# Patient Record
Sex: Male | Born: 1940 | Race: White | Hispanic: No | Smoking: Never smoker
Health system: Southern US, Community
[De-identification: ages and names within clinical notes are randomized; demographics above are authoritative.]

## PROBLEM LIST (undated history)

## (undated) DIAGNOSIS — M109 Gout, unspecified: Secondary | ICD-10-CM

## (undated) DIAGNOSIS — M25561 Pain in right knee: Secondary | ICD-10-CM

## (undated) DIAGNOSIS — I10 Essential (primary) hypertension: Secondary | ICD-10-CM

## (undated) DIAGNOSIS — K759 Inflammatory liver disease, unspecified: Secondary | ICD-10-CM

## (undated) DIAGNOSIS — E538 Deficiency of other specified B group vitamins: Secondary | ICD-10-CM

## (undated) DIAGNOSIS — I639 Cerebral infarction, unspecified: Secondary | ICD-10-CM

## (undated) DIAGNOSIS — K859 Acute pancreatitis without necrosis or infection, unspecified: Secondary | ICD-10-CM

## (undated) HISTORY — PX: CHOLECYSTECTOMY: SHX55

---

## 2003-07-01 HISTORY — PX: CORONARY ARTERY BYPASS GRAFT: SHX141

## 2008-10-26 ENCOUNTER — Ambulatory Visit: Payer: Self-pay | Admitting: Cardiology

## 2008-10-27 ENCOUNTER — Encounter (INDEPENDENT_AMBULATORY_CARE_PROVIDER_SITE_OTHER): Payer: Self-pay | Admitting: Internal Medicine

## 2008-10-27 ENCOUNTER — Ambulatory Visit: Payer: Self-pay | Admitting: Internal Medicine

## 2008-10-27 ENCOUNTER — Inpatient Hospital Stay (HOSPITAL_COMMUNITY): Admission: EM | Admit: 2008-10-27 | Discharge: 2008-11-02 | Payer: Self-pay | Admitting: Emergency Medicine

## 2008-10-31 ENCOUNTER — Ambulatory Visit: Payer: Self-pay | Admitting: Physical Medicine & Rehabilitation

## 2008-11-01 ENCOUNTER — Encounter (INDEPENDENT_AMBULATORY_CARE_PROVIDER_SITE_OTHER): Payer: Self-pay | Admitting: Internal Medicine

## 2008-11-01 ENCOUNTER — Ambulatory Visit: Payer: Self-pay | Admitting: Vascular Surgery

## 2008-11-02 ENCOUNTER — Inpatient Hospital Stay (HOSPITAL_COMMUNITY)
Admission: RE | Admit: 2008-11-02 | Discharge: 2008-11-14 | Payer: Self-pay | Admitting: Physical Medicine & Rehabilitation

## 2008-11-02 ENCOUNTER — Ambulatory Visit: Payer: Self-pay | Admitting: Physical Medicine & Rehabilitation

## 2008-11-10 ENCOUNTER — Ambulatory Visit: Payer: Self-pay | Admitting: Psychology

## 2008-11-29 ENCOUNTER — Encounter (INDEPENDENT_AMBULATORY_CARE_PROVIDER_SITE_OTHER): Payer: Self-pay | Admitting: *Deleted

## 2010-10-08 LAB — CBC
HCT: 44.9 % (ref 39.0–52.0)
HCT: 48 % (ref 39.0–52.0)
Hemoglobin: 16.3 g/dL (ref 13.0–17.0)
MCHC: 34.7 g/dL (ref 30.0–36.0)
MCHC: 35.3 g/dL (ref 30.0–36.0)
MCV: 92.4 fL (ref 78.0–100.0)
MCV: 92.8 fL (ref 78.0–100.0)
MCV: 93.8 fL (ref 78.0–100.0)
Platelets: 195 10*3/uL (ref 150–400)
Platelets: 202 10*3/uL (ref 150–400)
RBC: 5.05 MIL/uL (ref 4.22–5.81)
RBC: 5.07 MIL/uL (ref 4.22–5.81)
RBC: 5.07 MIL/uL (ref 4.22–5.81)
RDW: 13.1 % (ref 11.5–15.5)
RDW: 13.2 % (ref 11.5–15.5)
RDW: 13.3 % (ref 11.5–15.5)
RDW: 13.6 % (ref 11.5–15.5)
WBC: 6.9 10*3/uL (ref 4.0–10.5)
WBC: 6.9 10*3/uL (ref 4.0–10.5)
WBC: 8.2 10*3/uL (ref 4.0–10.5)

## 2010-10-08 LAB — COMPREHENSIVE METABOLIC PANEL
ALT: 13 U/L (ref 0–53)
ALT: 22 U/L (ref 0–53)
AST: 15 U/L (ref 0–37)
AST: 21 U/L (ref 0–37)
Albumin: 3.1 g/dL — ABNORMAL LOW (ref 3.5–5.2)
Alkaline Phosphatase: 45 U/L (ref 39–117)
Alkaline Phosphatase: 53 U/L (ref 39–117)
CO2: 30 mEq/L (ref 19–32)
Chloride: 102 mEq/L (ref 96–112)
Chloride: 109 mEq/L (ref 96–112)
GFR calc Af Amer: >60 mL/min (ref >60)
GFR calc Af Amer: >60 mL/min (ref >60)
GFR calc non Af Amer: >60 mL/min (ref >60)
Potassium: 4.3 mEq/L (ref 3.5–5.1)
Sodium: 140 mEq/L (ref 135–145)
Sodium: 142 mEq/L (ref 135–145)
Total Bilirubin: 0.8 mg/dL (ref 0.3–1.2)
Total Bilirubin: 1 mg/dL (ref 0.3–1.2)
Total Protein: 5.8 g/dL — ABNORMAL LOW (ref 6.0–8.3)

## 2010-10-08 LAB — BASIC METABOLIC PANEL
BUN: 11 mg/dL (ref 6–23)
BUN: 9 mg/dL (ref 6–23)
CO2: 27 mEq/L (ref 19–32)
CO2: 32 mEq/L (ref 19–32)
Calcium: 8.7 mg/dL (ref 8.4–10.5)
Calcium: 9 mg/dL (ref 8.4–10.5)
Chloride: 102 mEq/L (ref 96–112)
Chloride: 109 mEq/L (ref 96–112)
Creatinine, Ser: 0.8 mg/dL (ref 0.4–1.5)
Creatinine, Ser: 0.82 mg/dL (ref 0.4–1.5)
Creatinine, Ser: 1.04 mg/dL (ref 0.4–1.5)
GFR calc Af Amer: >60 mL/min (ref >60)
GFR calc Af Amer: >60 mL/min (ref >60)
GFR calc non Af Amer: >60 mL/min (ref >60)
Glucose, Bld: 88 mg/dL (ref 70–99)
Glucose, Bld: 96 mg/dL (ref 70–99)
Potassium: 3.7 mEq/L (ref 3.5–5.1)

## 2010-10-08 LAB — URIC ACID: Uric Acid, Serum: 6.3 mg/dL (ref 4.0–7.8)

## 2010-10-08 LAB — CARDIAC PANEL(CRET KIN+CKTOT+MB+TROPI)
CK, MB: 1.3 ng/mL (ref 0.3–4.0)
Total CK: 48 U/L (ref 7–232)
Troponin I: 0.02 ng/mL (ref 0.00–0.06)

## 2010-10-08 LAB — DIFFERENTIAL
Basophils Absolute: 0 10*3/uL (ref 0.0–0.1)
Basophils Relative: 1 % (ref 0–1)
Eosinophils Absolute: 0.1 10*3/uL (ref 0.0–0.7)
Eosinophils Relative: 1 % (ref 0–5)

## 2010-10-09 LAB — URINALYSIS, ROUTINE W REFLEX MICROSCOPIC
Ketones, ur: 15 mg/dL — AB
Nitrite: NEGATIVE
Protein, ur: NEGATIVE mg/dL

## 2010-10-09 LAB — COMPREHENSIVE METABOLIC PANEL
AST: 21 U/L (ref 0–37)
Alkaline Phosphatase: 53 U/L (ref 39–117)
BUN: 10 mg/dL (ref 6–23)
CO2: 28 mEq/L (ref 19–32)
Chloride: 105 mEq/L (ref 96–112)
Creatinine, Ser: 1.03 mg/dL (ref 0.4–1.5)
GFR calc Af Amer: >60 mL/min (ref >60)
GFR calc non Af Amer: >60 mL/min (ref >60)
Total Bilirubin: 0.7 mg/dL (ref 0.3–1.2)

## 2010-10-09 LAB — CARDIAC PANEL(CRET KIN+CKTOT+MB+TROPI)
CK, MB: 1.5 ng/mL (ref 0.3–4.0)
Relative Index: INVALID (ref 0.0–2.5)
Relative Index: INVALID (ref 0.0–2.5)
Total CK: 55 U/L (ref 7–232)
Total CK: 60 U/L (ref 7–232)
Troponin I: 0.02 ng/mL (ref 0.00–0.06)

## 2010-10-09 LAB — BRAIN NATRIURETIC PEPTIDE: Pro B Natriuretic peptide (BNP): 119 pg/mL — ABNORMAL HIGH (ref 0.0–100.0)

## 2010-10-09 LAB — RAPID URINE DRUG SCREEN, HOSP PERFORMED
Amphetamines: NOT DETECTED
Benzodiazepines: NOT DETECTED
Cocaine: NOT DETECTED
Opiates: NOT DETECTED
Tetrahydrocannabinol: NOT DETECTED

## 2010-10-09 LAB — DIFFERENTIAL
Basophils Absolute: 0 10*3/uL (ref 0.0–0.1)
Eosinophils Relative: 0 % (ref 0–5)
Lymphocytes Relative: 15 % (ref 12–46)
Neutro Abs: 5.2 10*3/uL (ref 1.7–7.7)
Neutrophils Relative %: 77 % (ref 43–77)

## 2010-10-09 LAB — HEPATIC FUNCTION PANEL
ALT: 17 U/L (ref 0–53)
Indirect Bilirubin: 0.6 mg/dL (ref 0.3–0.9)
Total Protein: 6.4 g/dL (ref 6.0–8.3)

## 2010-10-09 LAB — CBC
HCT: 52.1 % — ABNORMAL HIGH (ref 39.0–52.0)
Platelets: 206 10*3/uL (ref 150–400)
RDW: 13.4 % (ref 11.5–15.5)

## 2010-10-09 LAB — LIPID PANEL
Cholesterol: 207 mg/dL — ABNORMAL HIGH (ref 0–200)
HDL: 29 mg/dL — ABNORMAL LOW (ref >39)

## 2010-10-09 LAB — MAGNESIUM: Magnesium: 2.1 mg/dL (ref 1.5–2.5)

## 2010-10-09 LAB — TSH: TSH: 1.014 u[IU]/mL (ref 0.350–4.500)

## 2010-10-09 LAB — POCT I-STAT, CHEM 8
BUN: 14 mg/dL (ref 6–23)
Calcium, Ion: 0.99 mmol/L — ABNORMAL LOW (ref 1.12–1.32)
Chloride: 109 mEq/L (ref 96–112)

## 2010-10-09 LAB — APTT: aPTT: 26 seconds (ref 24–37)

## 2010-10-09 LAB — POCT CARDIAC MARKERS
Myoglobin, poc: 58.3 ng/mL (ref 12–200)
Troponin i, poc: <0.05 ng/mL (ref 0.00–0.09)

## 2010-11-12 NOTE — Consult Note (Signed)
NAME:  Brandon Howard NO.:  [id number]   MEDICAL RECORD NO.:  [id number]          PATIENT TYPE:  INP   LOCATION:  5524                         FACILITY:  [facility]   PHYSICIAN:  Deanna Artis. Hickling, M.D.DATE OF BIRTH:  21-May-1941   DATE OF CONSULTATION:  10/30/2008  DATE OF DISCHARGE:                                 CONSULTATION   CHIEF COMPLAINT:  Right hemidystaxia, nausea, vomiting, evaluate  subacute stroke.   HISTORY OF PRESENT CONDITION:  [name] is a 70 year old gentleman  who was admitted on October 26, 2008.  He had awakened and feeling  somewhat dizzy, but did not feel the front of his dizziness until he  started to go downstairs to breakfast.  He was unable to stand, felt  nauseated, and had vertigo.  He does not remember which direction.  He  came back upstairs to rest.  Ultimately, he was admitted to [facility].  History that was obtained was that he had been having  vomiting 5-6 times a day for 3 days.  He denied abdominal pain.  He  would not able to keep anything down for 4-5 days.  He was thought to  have gastroenteritis.  The history that he gave then and that he gives  now are very different.   The working diagnosis for this gentleman was dizziness, history of  alcohol abuse, new EKG changes.  He was seen by Cardiology who felt that  the EKG changes were nonspecific.  He was noted have hypertension,  dyslipidemia, as well as history of alcohol abuse.   CT scan of the brain was carried out on Oct 28, 2008 and showed what  appeared to be evidence of an infarction of the right cerebellar  hemisphere.  This was confirmed the next day, Oct 29, 2008 on MRI scan.  We were contacted this morning to evaluate this patient for his stroke.  It was noted that he had significant narrowing in the posterior cerebral  artery and the right superior cerebellar artery.  The question was  whether or not these could be stented and the answer was no.   The patient's nausea and vomiting have subsided.  He still remains  unsteady on his right side for fine and gross motor movements.   I reviewed the MRI scan and it shows evidence of a subacute stroke  involving virtually the entire territory of the right superior  cerebellar artery.  I have looked at the MRA and it shows a cut off 1.5  cm following the origin of the superior cerebellar artery.  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  The remainder of the vessels are unremarkable.  The  patient has diffuse atrophy that was both cortical, subcortical and also  within the white matter vessel tracts could be seen.  There was a  significant amount of movement.   No other remote strokes were seen.   Past medical history is remarkable for as noted above.  PAST SURGICAL HISTORY:  Coronary artery bypass graft about 4 or 5 years  ago with Evelene Croon.  The patient's known risk factors for stroke  include hypertension, dyslipidemia, and coronary artery disease.   The patient was on no medications prior to hospitalization and had not  seen a doctor in years.   He is currently on Norvasc 5 mg daily, aspirin 81 mg daily, subcutaneous  heparin 5000 units every 8 hours for DVT prophylaxis, lisinopril 40 mg  daily, Zocor 20 mg in the evening, oxycodone 4 mg every 4 hours as  needed, Zofran 4 mg every 6 hours as needed.   DRUG ALLERGIES:  None known.   FAMILY HISTORY:  Noncontributory.   SOCIAL HISTORY:  The patient drinks 4-5 beers daily.  He does not use  drugs.  He has not smoked cigarettes.  He lives by himself at [facility]  He has 4 children.  He had been divorced twice.   His 12-system review as recorded above.   PHYSICAL EXAMINATION:  VITAL SIGNS:  Today, blood pressure is 124-157/81-  92, resting pulse is 59, respirations 18, temperature 97.7.  HEAD,  EYES, EARS, NOSE AND THROAT:  No signs of infection.  No cranial  or cervical bruits.  LUNGS:  Clear to auscultation.  HEART:  No murmurs.  Pulses normal.  ABDOMEN:  Soft, nontender.  Bowel sounds normal.  EXTREMITIES:  Well formed without edema, cyanosis, alterations in tone,  or tight heel cords.  SKIN:  No lesions.  VASCULAR:  Tone was normal.  NEUROLOGIC:  Mental Status:  The patient is awake, alert, oriented, able  to carry out commands.  Cranial Nerves:  Round and reactive pupils.  Visual fields full.  Extraocular movements full and conjugate.  Symmetric facial strength and sensation.  He has mild slurred speech.  Sensation is intact in his face.  He has full shoulder shrug.  Motor  examination showed excellent strength in his arms in his legs and no  drift.  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  Deep tendon reflexes are symmetric  and normally upper extremity is diminished to absent lower extremities.  The patient had bilateral flexor plantar responses.  His gait is  unsteady.  He falls to the right side as he walks.  I was able to  support him along with his son as we allowed him to walk.   Review with his laboratories; hemoglobin A1c 5.4, TSH 1.014, vitamin B12  255.  Urine drug screen was negative.  Sodium 142, potassium 4.3,  chloride 109, CO2 29, BUN 12, creatinine 1.23, glucose 94.  White blood  cell count 6900, hemoglobin 15.7, hematocrit 44.9, platelet count  195,000.  Total triglycerides 178, cholesterol 207, HDL low at 29, LDL  elevated at 142.   IMPRESSION:  Right superior cerebellar artery stroke.  I suspect that  this was embolic, but given that he felt dizzy as he awakened, I cannot  rule out a thrombotic process.  There is a definite cut off.  We have no  source for emboli, but the he needs to have a 2-D echocardiogram,  carotid Doppler, and transcranial Doppler.  He has had the remainder of  his workup  for treatable causes of stroke.  I agree with all this  treatment for stroke other than aspirin needs to be increased from 81 to  325 mg.  He will be followed by my partner Dr. Pearlean Brownie from the Stroke  Service.  I do not think this patient is a good candidate for stent even  though there is a small lesion in the mid basilar region.  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 (434.11, 780.4,784.5))   The patient has bilateral limb dystaxia and dysarthria.  I gave him a  score of 3 for NIH stroke scale.  He had a swallowing study only today.  We will work on other risk factors for stroke.  He was not considered to  be a tPA  candidate because of delay in diagnosis, but also since he awakened with  dizziness, he would have presented much too late to receive tPA.  Readdressing his other risk factors.  We will consider rehab, which I  think will be the appropriate for him.      Deanna Artis. Sharene Skeans, M.D.  Electronically Signed     [facility]/MEDQ  D:  10/30/2008  T:  10/31/2008  Job:  [id number]   cc:   Peggye Pitt, M.D.

## 2010-11-12 NOTE — Discharge Summary (Signed)
NAME:  Brandon Howard, Brandon Howard NO.:  [id number]   MEDICAL RECORD NO.:  [id number]          PATIENT TYPE:  IPS   LOCATION:  4036                         FACILITY:  [facility]   PHYSICIAN:  Erick Colace, M.D.DATE OF BIRTH:  [birth year]   DATE OF ADMISSION:  11/02/2008  DATE OF DISCHARGE:  11/14/2008                               DISCHARGE SUMMARY   DISCHARGE DIAGNOSES:  1. Right cerebellar cerebrovascular accident with ataxia.  2. Hypertension.  3. Dyslipidemia.  4. Coronary artery disease.  5. Dizziness resolved.   HISTORY OF PRESENT ILLNESS:  Brandon Howard is a 70 year old male with  history of coronary artery disease admitted October 26, 2008 with  dizziness, nausea, vomiting and numbness in right arm.  Past admission  EKG done showed some ST changes.  CT of head showed edema right  cerebellum question mass.  Cardiology was consulted for input and felt  ST changes were nonspecific in setting of LVH and dizziness was not  cardiac related.  Neuro was consulted for input and MRI, MRA of the  brain ordered showed acute to subacute infarct affecting right superior  cerebellar hemispheres, significant venous angioma, mild swelling with  chronic small vessel disease, stenosis of right PCA at origin, and  marked diminished flow right superior cerebellar artery.  Cardiac echo  done showed moderate LVH with EF of 60%.  Carotid Doppler showed left 40-  60% ICA stenosis.  The patient was placed on Plavix for CVA prophylaxis  with question of RS trial.  Speech therapy evaluation done showed severe  dysarthria with the patient needing max cues to slow speech tone and  articulate.  Continues with dysmetric right upper extremity greater than  right lower extremity as well as impairments in balance and stability.  The patient was evaluated by rehab and felt to be a good candidate for  inpatient rehab program.   PAST MEDICAL HISTORY:  Significant for:  1. CABG in 2005.  2.  Dyslipidemia.  3. Hypertension.  4. Medical noncompliance.  5. History of cholelithiasis, pancreatitis and hepatitis leading to      cholecystectomy.   ALLERGIES:  No known drug allergies.   FAMILY HISTORY:  Positive for CVA and COPD.   SOCIAL HISTORY:  The patient is divorced, lives alone in [place]  place in one-level home.  No steps at entry.  Chews one pack tobacco a  day.  Drinks 4-6 beers a day.  The family in town to check in on patient  past discharge.   FUNCTIONAL HISTORY:  The patient was independent prior to admission.  He  does not drive.   FUNCTIONAL STATUS:  The patient is mod assist for ADLs, min assist for  transfers, min assist ambulating 55 feet with a rolling walker,  difficulty with right lower extremity with tendency for right lower  extremity cross over.  Occasional right lean reported with ambulation.   PHYSICAL EXAMINATION:  VITAL SIGNS:  Blood pressure 99/69, pulse 66,  respiratory rate 20, and temperature 97.0.  GENERAL:  The patient is well-nourished, well-developed male in no acute  distress currently.  HEENT:  Pupils equal, round,  and reactive to light.  Eyes anicteric,  noninjected.  External ENT normal.  Poor dentition noted.  NECK:  Supple without JVD or lymphadenopathy.  LUNGS:  Clear to auscultation without wheezes, rales or rhonchi.  HEART:  Regular rate and rhythm without murmurs.  EXTREMITY:  No edema.  He has good pedal pulses.  ABDOMEN:  Soft, nontender with positive bowel sounds.  NEUROLOGIC:  Cranial nerves showed no evidence of cranial nerve palsy,  deep tendon reflexes normal.  Sensation normal.  Orientation is x3.  Mood and affect appeared intact.  Motor strength is 5/5 bilateral  deltoid, biceps, triceps, finger flexors, hip flexors, quad, TA and  gastroc.  He has moderate-to-severe dysmetria right finger-to-nose and  right heel to shin, sitting balance is fair.   HOSPITAL COURSE:  Brandon Howard was admitted to rehab on  Nov 02, 2008 for inpatient therapies to consist of PT, OT and Speech Therapy at  least 3 hours 5 days a week.  Past admission physiatrist, rehab RN and  therapy team have worked together to provide customized collaborative  interdisciplinary care.  The patient's blood pressures have been  monitored with b.i.d. checks.  These had been reasonably controlled  ranging from 110-120 systolics and 70s to 80s diastolic.  Heart rate has  ranged from 70s to 80s range.  Labs were checked past admission  revealing hemoglobin 16.3, hematocrit 47.5, white count 8.2, platelets  240.  Check of lytes revealed sodium 140, potassium 4.0, chloride 102,  CO2 30, BUN 16, creatinine 1.16.  Glucose of 112.  LFTs revealed AST 21,  ALT 22, total protein 6.8, albumin 3.1.  The patient with complaints of  gout with right ankle pain.  Uric acid level was noted to be at 6.3.  The patient was treated with colchicine 0.6 mg x5 days and started on  allopurinol standing dose which is to continue past discharge.  The  patient reports using excessive amounts of Aleve at home for gout flares  which occur a couple of times a month.  He is advised not to use Aleve  and to continue allopurinol at 100 mg a day with further adjustment in  dose by primary MD as needed to prevent gout flares.  The patient's  vitamin B12 levels were noted to be borderline low at 255.  He was  treated with vitamin B12 injections 1000 mcg x7 days.  It is recommended  the patient continue on q. month supplementation to help with vitamin  B12 deficiency.  The patient's p.o. intake has been good.  He has been  continent of bowel and bladder.  The patient has had issues with right  knee instability and pain.  X-rays of right knee done showed significant  tricompartmental degenerative changes with possible small joint  effusion, no bony findings.  The patient was fitted with knee brace to  help with his symptomatology as well as for support which has  helped  with his mobility.  Neuropsych evaluation was done for alcohol  counseling.  Dr. Leonides Cave evaluated the patient and the patient states he  intended to abstain from alcohol and tobacco in the future.  The patient  is aware that alcohol and tobacco are risk factors for his CVA.  He  denied any current cravings or withdrawal symptoms from alcohol.  The  patient was not interested in any alcohol counseling or services at the  current time.   During the patient's stay in rehab, weekly team conferences were held  to  monitor the patient's progress, set goals as well as discuss barriers to  discharge.  Rehab RN has been assisting the patient with emphasis on  safety awareness.  They have also been toileting patient q.4 h. for  maintenance of continence.  They have been working on pressure relief  measures to prevent any breakdown.  At the time of admission to rehab,  the patient was limited in ADL participation due to ataxia with decrease  in static as well as dynamic standing balance, decrease in right upper  extremity control with proprioception and sensory deficits.  He was  noted to have decrease in endurance as well as decrease in overall  strength.  OT has been working with the patient on balance at edge of  sink for bathing and dressing.  The patient was noted to be at min  assist for ADLs overall.  OT has also been working the patient on right  upper extremity control and utilizing right upper extremity for bathing.  The patient has progressed along in his self-care to being modified  independent for bathing, dressing as well as toileting.  PT evaluation  revealed patient supervision for bed mobility requiring verbal cues for  safety.  He was noted to be impulsive with mobility.  He was min assist  for sit to stand.  He was min assist ambulating 100 feet with a rolling  walker with tactile cues for truncal alignment as well as weight  shifting.  Due to issues with right knee  instability, right knee  orthosis was ordered which has improved his right lateral weight  shifting as well as improved stance in right lower extremity.  PT has  been working with the patient on donning and doffing the brace as well  as static dynamic standing balance activity to include stepping over  objects stooping on floor to recover objects as well as ball toss to  help challenges balance.  The patient is showing improvement in his  safety awareness as well as improvement in weight shifting.  He also  reported a significant decline in right knee pain with the knee brace.  The patient progressed to overall modified independent level for  ambulation, wheelchair mobility as well as transfer using his right knee  orthosis as well as a rolling walker.  He was able to negotiate five  stairs with curb and ramp with a rolling walker at modified independent  level.  The patient will continue with outpatient physical therapy to  address right knee instability as well as high-level balance deficits.  Speech Therapy has been working with the patient on dysarthria.  At the  time of admission, the patient was noted to have moderate ataxic  dysarthria with decreased articulation and coordination as well as  decrease in accuracy of articulation resulting in 70% speech  intelligibility at conversation level.  He was also noted to have  deficits in recall attention and complex reasoning however, question  whether this was premorbid.  Speech Therapy has been working with the  patient on speech strategies including using short sentences in speech  as well as increasing speech intelligibility.  They have also been  working with the patient on memory with techniques for recall as well as  carryover.  The patient has made good progress in speech and cognitive  therapy goals.  He is currently modified independent for mild complex  problem solving and requiring supervision to intermittent cues for  complex  level problem solving.  He  is able to increase speech  intelligibility to 90-95% with intermittent cues to use speech  strategies in conversation.  The patient understands strategies needed  to help with clarity of speech and should improve to 100% over  time.  The patient is set up for outpatient PT, OT, Speech Therapy at [facility] to begin on Nov 15, 2008.  On Nov 14, 2008, the  patient is discharged to home with family to provide supervision and  assist intermittently.   DISCHARGE MEDICATIONS:  1. Plavix 75 mg p.o. per day.  2. Norvasc 10 mg p.o. per day.  3. Zocor 40 mg at bedtime.  4. Vitamin B1 100 mg a day.  5. Folic acid 1 mg a day.  6. Prinivil 20 mg a day.  7. Trazodone 25 mg at bedtime.  8. Senokot-S two p.o. at bedtime.  9. Allopurinol 100 mg p.o. per day.   DIET:  Heart healthy.   ACTIVITY LEVEL:  Min supervision.  No strenuous activity.  No alcohol,  no smoking, no driving.   SPECIAL INSTRUCTIONS:  [facility] to begin Wednesday,  Nov 15, 2008, at 11 a.m.   FOLLOW UP:  The patient is to follow up with Dr. Reyes Ivan on December 19, 2008, for 12:30 appointment.  Follow up with Dr. Concepcion Elk November 28, 2008,  at 9 a.m.      Greg Cutter, P.A.      Erick Colace, M.D.  Electronically Signed    PP/MEDQ  D:  11/15/2008  T:  11/15/2008  Job:  [id number]   cc:   [name], M.D.  Deanna Artis. Sharene Skeans, M.D.

## 2010-11-12 NOTE — H&P (Signed)
NAME:  Brandon Howard, Brandon Howard NO.:  [id number]   MEDICAL RECORD NO.:  [id number]          PATIENT TYPE:  INP   LOCATION:  5524                         FACILITY:  [facility]   PHYSICIAN:  Vania Rea, M.D. DATE OF BIRTH:  [birth year]   DATE OF ADMISSION:  10/26/2008  DATE OF DISCHARGE:                              HISTORY & PHYSICAL   This is a 70 year old with past medical history of CAD status post CABG  comes in for dizziness, better with lying still and lying flat and worse  with sitting up and standing up.  This has been going on for several  days.  He started vomiting with these episodes about 5-6 times a day for  the last 3 days.  He denies any abdominal pain, decreased urine output.  He has not been able to keep anything down for the past 4 or 5 days.  He  denies any chest pain, shortness of breath, fever or chills, upper  respiratory tract infection, decreased hearing, tinnitus, cough, or  viral upper respiratory tract infection.   ALLERGIES:  No known drug allergies.   PAST MEDICAL HISTORY:  1. CABG 4 or 5 years ago.  2. Alcohol abuse.   MEDICATIONS:  No known medications.   SOCIAL HISTORY:  Drinks 4-6 beers every day.  His last drink was 4 days  ago.  Denies recreational drugs, marijuana, cocaine, and IV drugs.  Denies tobacco.   FAMILY HISTORY:  Father died of COPD the age of 36.  His mother died of  throat cancer at the age of 30.   REVIEW OF SYSTEMS:  GENERAL:  Lightheadedness.  He says he is thirsty  and his mouth feels dry.  CARDIOVASCULAR:  No palpitation.  No PND.  No chest pain.  LUNGS:  No shortness of breath, cough, or hemoptysis.  GI:  No abdominal pain.  No diarrhea or constipation.  NEUROLOGIC:  No blurry vision or double vision.   PHYSICAL EXAMINATION:  VITAL SIGNS:  Temperature 97.6, heart rate of 62,  blood pressure 205/98, repeated blood pressure 162/78, respirations 14,  he is sating 95% on room air.  GENERAL:  He is lying in bed  still with his eyes closed.  NECK:  No bruits or JVD.  HEENT:  Dry mucous membrane.  No erythema or plaque.  CARDIOVASCULAR:  Regular rate and rhythm.  Positive S1 and S2.  No  murmurs, rubs, or gallops.  LUNGS:  Good air movement and clear to auscultation.  ABDOMEN:  Positive bowel sounds, nontender, nondistended, and soft.  No  hepatosplenomegaly.  EXTREMITIES:  Normal pulses.  No edema.  No ulcers.  NEUROLOGIC:  He is alert and oriented x3, coherent and fluent language,  III through XII are grossly intact.  The patient has no nystagmus and  normal hearing.  Sensation is intact throughout.  Muscle strength is 5/5  in all extremities, 2+ deep tendon reflex.  Finger-to-nose normal.  Gait  was not tested.   LABORATORY DATA:  UA showed positive for ketones.  Sodium 139, potassium  3.4, chloride 109, bicarb of 25, BUN 14, creatinine 1.2, glucose 136.  Bilirubin 0.7, alkaline phosphatase 58, AST 19, ALT 17.  Protein 6.4,  albumin 3.6.  Point of cardiac care markers are negative x2.  White  count 6.7, ANC 5.2, hemoglobin 17.9 with an MCV of 93.   ASSESSMENT AND PLAN:  1. Dizziness.  The patient having nausea and vomiting, not able to      keep anything down, made worse by sitting up or standing.  EKG      showed new T-wave inversions.  We will admit as the patient cannot      take p.o.  We will check cardiac enzymes x3 q.8 hours.  We will      check an EKG in a.m.  We will also check electrolytes, calcium, and      magnesium.  We will give Zofran for nausea.  If symptoms persist in      the morning, we may consider getting an MRI.  A CT scan to      visualize posterior fossa, but the patient has no neurological      focal findings.  We will hydrate aggressively with normal saline.  2. New EKG changes.  The patient denies chest pain or angina      equivalents.  We will check cardiac enzymes x3 due to his history      of coronary artery bypass grafting.  We will check also a TSH,       fasting lipid panel, hemoglobin A1c.  We will also check UDS and a      2-D echo in the morning.  3. Alcohol abuse.  We will give the patient thiamine and folate.  His      last drink was more than 4 days ago, so will monitor for alcohol      withdrawal.  4. Coronary artery disease.  We will start him on aspirin and Lipitor      as he needs to be on them  long term.  We will also start him on a      beta-blocker, low-dose metoprolol  to the extent that he can      tolerate oral meds.  5. Elevated hemoglobin.  This is most likely secondary to dehydration.      We will hydrate aggressively and repeat a CBC in the morning.  6.      Hypertension.  The patient is hypertensive.  This could be      secondary to all the retching that he is doing.  We will start him      on metoprolol and we will re-evaluate in the morning.      Marinda Elk, M.D.  Electronically Signed      Vania Rea, M.D.  Electronically Signed    AF/MEDQ  D:  10/26/2008  T:  10/27/2008  Job:  [id number]

## 2010-11-12 NOTE — Discharge Summary (Signed)
NAME:  Brandon Howard, Brandon Howard NO.:  [id number]   MEDICAL RECORD NO.:  [id number]          PATIENT TYPE:  IPS   LOCATION:  4036                         FACILITY:  [facility]   PHYSICIAN:  Charlestine Massed, MDDATE OF BIRTH:  [birth year]   DATE OF ADMISSION:  11/02/2008  DATE OF DISCHARGE:  11/02/2008                               DISCHARGE SUMMARY   PRIMARY CARE DOCTOR:  Unassigned.   NEUROLOGY:  Dr. Pearlean Brownie.   CARDIOLOGY:  Dr. Dietrich Pates.   REASON FOR ADMISSION:  Dizziness and gait instability with nausea and  vomiting.   DISCHARGE DIAGNOSES:  1. Acute right cerebellar stroke/infarction.  2. 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  3. Vitamin B12 deficiency.  4. History of coronary artery disease, status post coronary artery      bypass grafting.  5. Hypertension, currently stable.  6. Alcohol abuse history, currently no withdrawal symptoms.  7. Slightly high hemoglobin/hematocrit on the upper limit of normal.   DISCHARGE MEDICATIONS:  1. Norvasc 10 mg p.o. daily.  2. Plavix 75 mg p.o. daily.  3. Folic acid 1 mg p.o. daily.  4. Vitamin B1 thiamine 100 mg p.o. daily.  5. Zocor 40 mg p.o. nightly.  6. Lisinopril 40 mg p.o. daily.  7. Scopolamine one patch transdermally q.72 hours.  8. Senna 1 tablet p.o. nightly.  9. Vitamin B12 one thousand micrograms per day intramuscularly for      next 7 days and then one dose q. Monthly.   HOSPITAL COURSE BY PROBLEM:  1. Cerebellar stroke.  The patient came in with complaints of nausea,      vomiting and dizziness.  The patient was admitted initially.  He      was dehydrated so he was given meclizine because of the dizziness      and he was also given Zofran.  The patient's condition improved.      He had an MRI done in view of this for localizing findings and was      found to have an infarct in the right cerebellum so patient's      ataxia and dizziness were found to be due to this issue.  The      patient  was seen by Neurology and they have evaluated the patient      and patient has been started on Plavix for his issues.  His blood      pressure has been controlled.  Currently the patient is being      transferred to acute rehab for further gait training and fall      prevention and will continue the same medications at rehab.  2. Hypertension.  Currently continue Norvasc and lisinopril, blood      pressure is controlled at this moment.  3. Dyslipidemia.  Continue Zocor, lipid levels are stable.  4. History of alcohol abuse.  Continue thiamine and folic acid.  There      are no withdrawal symptoms at this time.  5. Vitamin B12 deficiency.  Patient methyl malonic  acid level is high      and vitamin B12 the lower limit of normal  so he has true vitamin      B12 deficiency.  We will give vitamin B12 injection 1000 mcg per      day for 7 days and then q. Monthly.  6. PT, OT and rehab.  The patient will be going to rehab for further      fall prevention, gait training and for other issues related to the      stroke.   DISPOSITION:  Transferred to acute rehab/[facility].   FOLLOWUP:  1. Follow up with Neurology, Dr. Pearlean Brownie, in 2 months.  2. Followup with Dr. Dietrich Pates at [facility], [address] the phone number [phone] on Nov 23, 2008 at 8:30      a.m.  3. Alcohol abstinence advised, patient education given.  He exhibited      understanding.   A total of 40 minutes was spent on this discharge.      Charlestine Massed, MD  Electronically Signed     UT/MEDQ  D:  11/02/2008  T:  11/02/2008  Job:  [id number]   cc:   Pricilla Riffle, MD, [facility]  Pramod P. Pearlean Brownie, MD

## 2010-11-12 NOTE — H&P (Signed)
NAME:  Brandon Howard, VONADA NO.:  [id number]   MEDICAL RECORD NO.:  [id number]          PATIENT TYPE:  IPS   LOCATION:  4036                         FACILITY:  [facility]   PHYSICIAN:  Erick Colace, M.D.DATE OF BIRTH:  [birth year]   DATE OF ADMISSION:  11/02/2008  DATE OF DISCHARGE:                              HISTORY & PHYSICAL   CHIEF COMPLAINT:  Unable to use right arm well.   HISTORY OF PRESENT ILLNESS:  A 70 year old male with prior history of  coronary artery disease who was admitted on October 26, 2008, with  dizziness, nausea, vomiting, numbness of the right arm.  EKG showed ST-T  changes.  Cranial CT showed edema in the right cerebellum.  Cardiology  was consulted and felt that ST changes were nonspecific in the setting  of LVH and dizzy.  This was not cardiac related.  Neuro was consulted.  MRA of the brain, Oct 29, 2008, with acute, subacute infarct affecting  the right superior cerebellar hemisphere.  He had insignificant venous  angioma, mild swelling, and chronic small vessel changes, stenosis of  the right PCA at origin, and marked diminished flow right superior  cerebellar artery.  Cardiac echo showed moderate LVH, ejection fraction  of 60%.  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  The patient  was placed on Plavix for CVA prophylaxis and maybe enrolled in the high  risk trial.  Speech Therapy evaluation demonstrated severe dysarthria  and needing moderate to max cues to slow down and over articulate.  The  patient states that these speech problems are new since the stroke.  He  has continued to have some problems with dysmetria noted during his  therapies.   He has had chronic right foot and ankle pain.  He states he has had gout  in the past.  The right ankle x-ray did not show any evidence of a  fracture or dislocation.   His review of systems, he is constipated.  He is continent of urine.  He  has weakness mainly in his right arm.  He  has insomnia.  He does have an  occasional cough, denies having a history of smoking.  ENT, positive for  dysphagia.   PAST SURGICAL HISTORY:  CABG in 2005.   PAST MEDICAL HISTORY:  Dyslipidemia, hypertension, eczema.   FAMILY HISTORY:  CVA, COPD.   SOCIAL HISTORY:  Divorced, lives alone at [employer] one level home.  No steps to enter.  Tobacco negative, EtOH.  The patient told me 3-4  beers per day, but it was also told somebody else 4-6 per day.  Children  in town can provide supervision after discharge.   FUNCTIONAL HISTORY:  Independent prior to admission.   MEDICATIONS AT HOME:  None has been noncompliant, has no PCP.   PHYSICAL EXAMINATION:  VITAL SIGNS:  Blood pressure 99/69, pulse 66,  respirations 20, temp 97.0.  GENERAL:  He is a elderly male in no acute distress.  HEENT:  Eyes anicteric, noninjected.  External ENT normal.  He has poor  dentition.  NECK:  Supple without adenopathy.  LUNGS:  Clear.  HEART:  Regular rate and rhythm.  EXTREMITIES:  Without edema.  He has good pedal pulses.  ABDOMEN:  Positive bowel sounds, soft, nontender palpation.  EXTREMITIES:  Without edema.  No evidence of intrinsic atrophy.  He does  have some areas of eczema at the elbows.  NEUROLOGIC:  Cranial nerves shows no evidence of cranial nerve palsy.  Deep tendon reflexes are normal, sensation was normal.  Orientation x3.  Affect and mood appear intact.  His motor strength is 5/5 bilateral  deltoid, biceps, triceps, finger flexors, hip flexor, quad, TA, and  gastroc.   He has moderately severe dysmetria, right finger-nose-finger, and right  heel-to-shin.   His sitting balance is fair.   POST ADMISSION PHYSICIAN EVALUATION:  1. Functional deficits secondary to right cerebellar cerebrovascular      accident with ataxia, right upper extremity greater than lower      extremity dysmetria and dysarthria.  2. The patient is admitted to receive collaborative interdisciplinary       care between the physiatrist, rehab nursing staff, and therapy      team.  3. The patient's level of medical complexity and substantial therapy      needs in context of that medical necessity cannot be provided at a      lesser intensity of care such as SNF.  4. The patient has experienced substantial functional loss from his      baseline.  Upon functional assessment at the time of preadmission      screening, the patient was min assist transfers, min assist      ambulation, and ambulating 55 feet.  Currently, his mod assist ADLs      and min assist transfers, min assist ambulation with cues for      placement of right lower extremities.   Judging by the patient's diagnosis, physical exam, and functional  history, the patient has a potential for functional progress, which will  result in measurable gains while on inpatient rehab.  These gains will  be of substantial and practical use upon discharge to home in  facilitating mobility, self care, and independence.  Interim changes in  medical status since preadmission screening are detailed in history of  present illness.  1. Physiatrist will provide 24-hour management of medical needs, as      well as oversight of the therapy plan/treatment and provide      guidance as appropriate regarding the interaction of the two.  2. A 24-hour rehab nursing will assist in the management of      medications, blood pressure, bowel and bladder management, and help      integrate therapy concepts, techniques, and education.  3. PT will assess and treat for range of motion strengthening, bed      mobility, transfers, pre-gait training, gait training, and      equipment.  Goals are for a supervision level with mobility.  4. OT will assess and treat for range of motion strengthening, ADLs,      cognitive perceptual training, splinting an equipment.  5. Goals are for a supervision level for all ADLs.  6. SLP will assess and treat for dysarthria or motor  exercises.  Goals      are for intelligible speech 80% of the time.  7. Case management and social worker will assess and treat for      psychosocial issues and discharge planning.  8. A team conference will be held weekly to assess the patient's  progress/goals and to determine barriers to discharge.  9. The patient has demonstrated sufficient medical stability and      exercise capacity to be tolerated at least 3 hours therapy per day,      at least 5 days per week.  10.Estimated length of stay is 2 weeks.  Functional prognosis is good,      although I think he will need supervision level for weeks to months      post discharge.   MEDICAL PROBLEM LIST AND PLAN:  1. Right cerebellar cerebrovascular accident with ataxia, dysarthria,      dysmetria.  We will continue Plavix for secondary stroke      prophylaxis.  2. Hypertension.  BP is trending down on Norvasc.  Lisinopril have      reduced, lisinopril 20 mg a day.  We will have to monitor his blood      pressure to make sure it is not yet overly corrected.  3. Dyslipidemia.  Continue Zocor.  4. Dizziness.  Resolved on scopolamine patch.  We will continue this,      wean towards the end of discharge.  5. Coronary artery disease, currently asymptomatic.  We will monitor      while increasing activity levels.  6. DVT prophylaxis with subcutaneous heparin.  7. History of alcohol abuse.  He is drinking possibly six beers per      day.  He has been in the hospital for over a week.  No signs of DTs      thus far and will supplement B12   We will need to involve neuropsych to assess for a substance abuse  counseling.      Erick Colace, M.D.  Electronically Signed     AEK/MEDQ  D:  11/02/2008  T:  11/03/2008  Job:  [id number]   cc:   Deanna Artis. Sharene Skeans, M.D.

## 2010-11-12 NOTE — Consult Note (Signed)
NAME:  Brandon Howard, Brandon Howard NO.:  [id number]   MEDICAL RECORD NO.:  [id number]          PATIENT TYPE:  INP   LOCATION:  5524                         FACILITY:  [facility]   PHYSICIAN:  Pricilla Riffle, MD, FACCDATE OF BIRTH:  1940-12-24   DATE OF CONSULTATION:  10/27/2008  DATE OF DISCHARGE:                                 CONSULTATION   IDENTIFICATION:  The patient is a 70 year old who we are asked to see  regarding EKG changes.   HISTORY OF PRESENT ILLNESS:  By report, the patient has a history of  coronary artery disease.  He had coronary artery bypass grafting here in  the past.  I do not have the dates.  The patient has not been followed  up by a cardiologist.   He was admitted yesterday.  See this H and P for details, admitted  because of dizziness, nausea, vomiting.   On talking to him, I got the history that he was okay until a couple of  days ago.  Couple of days ago, he began having dizziness with changing  in position.  Yesterday, after supper, he became very dizzy.  Every time  he sat up, he had nausea and vomiting.  Today, this has continued; every  time he sits up he gets dizzy and vomits.  Denies diarrhea.  Notes  occasional chest pain, not new.  Also notes numbness today in his right  arm.   MEDICATION ALLERGIES:  None.   MEDICATIONS PRIOR TO ADMISSION:  None.   Here:  1. Aspirin.  2. Heparin 5000 subcu q.8 h.  3. Protonix 40.  4. Senna p.r.n.  5. Tylenol p.r.n.  6. Albuterol p.r.n.  7. Atrovent.  8. Zofran p.r.n.  9. Oxycodone p.r.n.  10.Zocor 20 at bedtime.  11.Lopressor 5 mg IV p.r.n.   PAST MEDICAL HISTORY:  1. Coronary artery disease, status post CABG 5 years ago.  2. History of EtOH abuse.  3. History of hypertension.  4. History of noncompliance.   SURGICAL HISTORY:  Status post CABG.   SOCIAL HISTORY:  The patient drinks 4 to 6 beers per day.  Does not  smoke or use other drugs.   FAMILY HISTORY:  Significant for father who had  an MI at a young age.   REVIEW OF SYSTEMS:  Numbness in the right arm today, occasional chest  pain that the patient reports is not critical.   PHYSICAL EXAMINATION:  GENERAL:  The patient is lying in bed, in no  acute distress.  VITAL SIGNS:  Blood pressure is 160/90, ranging from 153-197 over 79-  108.  Pulse in the 70s.  Orthostatic check was not complete, lying  153/95, pulse 73; sitting 158/93, pulse 78.  Would not stand.  HEENT:  Normocephalic, atraumatic.  EOMI.  PERRL.  Mucous membranes are  dry.  NECK:  JVP is normal.  No audible bruits.  No thyromegaly.  LUNGS:  Relatively clear.  No rales anteriorly.  CARDIAC:  Regular rate and rhythm.  S1 and S2.  No S3.  No significant  murmurs.  ABDOMEN:  Supple, nontender.  No hepatomegaly.  Normal bowel sounds.  EXTREMITIES:  No edema.  Dorsalis pedis pulses 2+.  Moving all  extremities.  Strength is 5-/5 in the upper extremities.  NEUROLOGIC:  Cranial nerves II through XII grossly intact.  Decreased  rapid alternating movement, numb, right arm.   LABORATORY DATA:  Significant for BUN and creatinine of 10 and 1,  potassium of 4.1.  CK-MB negative x2.  Troponin negative x2.  BNP of  119.  Urine drug screen negative.  Hemoglobin A1c of 5.4.  TSH of 1.01.  Hemoglobin of 17.9.  WBC of 6.7, platelets of 206.  Cholesterol:  Total  cholesterol 207, HDL of 29, LDL of 142, triglycerides 604.  Chest x-ray  shows cardiomegaly, mild venous congestion, bibasilar atelectasis with  small right effusion.  Portable exam.  A 12-lead EKG on April 29, at  7:47 shows sinus rhythm, 77 beats per minute.  Possible inferior wall  MI.  T-wave inversion in V6, I, and VL.  At 149 and 129, T-wave  inversion is not present in V6.  Q-wave no longer present in III.  Note,  this is different from EKG of 1990s.  T-wave inversion was not  pronounced.  Echocardiogram shows significant LVH with an  interventricular septum and posterior wall measuring 16 and 17 mm.   LVEF  is normal at 60%.   IMPRESSION:  1. EKG changes are not specific, maybe solely in the setting of LVH      with no active ischemic complaints.  I am not convinced dizziness      is cardiac.  I am worried in fact that the dizziness could      represent a CNS abnormality, question cerebrovascular accident.      The patient also complains of numbness on exam, would pursue MRI,      MRA to evaluate.  Continue telemetry.  The patient refuses      orthostatics.  2. Coronary artery disease.  Continue telemetry.  Has ruled out for      MI.  History of CAD.  Does need followup.  Consider Myoview when      other problems dizziness and numbness are answered.  3. Hypertension.  Follow, give IV Lopressor until taking p.o.  4. Increased lipids, abnormal.  Should be on statin, agree with Zocor.  5. EtOH.  Getting thiamine, folate.  Watch for DTs.   ADDENDUM:  Note:  Spoke with Dr. Ardyth Harps and sons.  The patient's son  report chest pain going to the right arm on and off at home.  Again  would look into dizziness once this eval complete, Myoview, as he is a  difficult historian and has history of CAD.      Pricilla Riffle, MD, [facility]  Electronically Signed    PVR/MEDQ  D:  10/27/2008  T:  10/28/2008  Job:  [id number]

## 2017-01-31 ENCOUNTER — Inpatient Hospital Stay (HOSPITAL_COMMUNITY)
Admission: EM | Admit: 2017-01-31 | Discharge: 2017-02-04 | DRG: 065 | Disposition: A | Discharge status: Rehabilitation Facility | Payer: Medicare HMO | Attending: Family Medicine | Admitting: Family Medicine

## 2017-01-31 ENCOUNTER — Inpatient Hospital Stay (HOSPITAL_COMMUNITY): Payer: Medicare HMO

## 2017-01-31 ENCOUNTER — Encounter (HOSPITAL_COMMUNITY): Payer: Self-pay | Admitting: Neurology

## 2017-01-31 ENCOUNTER — Emergency Department (HOSPITAL_COMMUNITY): Payer: Medicare HMO

## 2017-01-31 DIAGNOSIS — I4510 Unspecified right bundle-branch block: Secondary | ICD-10-CM | POA: Diagnosis present

## 2017-01-31 DIAGNOSIS — I639 Cerebral infarction, unspecified: Secondary | ICD-10-CM | POA: Diagnosis present

## 2017-01-31 DIAGNOSIS — Z825 Family history of asthma and other chronic lower respiratory diseases: Secondary | ICD-10-CM

## 2017-01-31 DIAGNOSIS — G8191 Hemiplegia, unspecified affecting right dominant side: Secondary | ICD-10-CM | POA: Diagnosis present

## 2017-01-31 DIAGNOSIS — I6523 Occlusion and stenosis of bilateral carotid arteries: Secondary | ICD-10-CM | POA: Diagnosis present

## 2017-01-31 DIAGNOSIS — I69391 Dysphagia following cerebral infarction: Secondary | ICD-10-CM

## 2017-01-31 DIAGNOSIS — R4701 Aphasia: Secondary | ICD-10-CM | POA: Diagnosis present

## 2017-01-31 DIAGNOSIS — I63213 Cerebral infarction due to unspecified occlusion or stenosis of bilateral vertebral arteries: Secondary | ICD-10-CM | POA: Diagnosis not present

## 2017-01-31 DIAGNOSIS — I6789 Other cerebrovascular disease: Secondary | ICD-10-CM | POA: Diagnosis not present

## 2017-01-31 DIAGNOSIS — I6930 Unspecified sequelae of cerebral infarction: Secondary | ICD-10-CM | POA: Diagnosis not present

## 2017-01-31 DIAGNOSIS — E876 Hypokalemia: Secondary | ICD-10-CM | POA: Diagnosis not present

## 2017-01-31 DIAGNOSIS — I651 Occlusion and stenosis of basilar artery: Secondary | ICD-10-CM | POA: Diagnosis present

## 2017-01-31 DIAGNOSIS — R739 Hyperglycemia, unspecified: Secondary | ICD-10-CM | POA: Diagnosis present

## 2017-01-31 DIAGNOSIS — Z808 Family history of malignant neoplasm of other organs or systems: Secondary | ICD-10-CM

## 2017-01-31 DIAGNOSIS — I69398 Other sequelae of cerebral infarction: Secondary | ICD-10-CM | POA: Diagnosis not present

## 2017-01-31 DIAGNOSIS — E785 Hyperlipidemia, unspecified: Secondary | ICD-10-CM | POA: Diagnosis present

## 2017-01-31 DIAGNOSIS — I69331 Monoplegia of upper limb following cerebral infarction affecting right dominant side: Secondary | ICD-10-CM

## 2017-01-31 DIAGNOSIS — Z951 Presence of aortocoronary bypass graft: Secondary | ICD-10-CM

## 2017-01-31 DIAGNOSIS — I63219 Cerebral infarction due to unspecified occlusion or stenosis of unspecified vertebral arteries: Secondary | ICD-10-CM | POA: Diagnosis not present

## 2017-01-31 DIAGNOSIS — I69322 Dysarthria following cerebral infarction: Secondary | ICD-10-CM | POA: Diagnosis not present

## 2017-01-31 DIAGNOSIS — I10 Essential (primary) hypertension: Secondary | ICD-10-CM | POA: Diagnosis present

## 2017-01-31 DIAGNOSIS — I6503 Occlusion and stenosis of bilateral vertebral arteries: Secondary | ICD-10-CM | POA: Diagnosis present

## 2017-01-31 DIAGNOSIS — R29706 NIHSS score 6: Secondary | ICD-10-CM | POA: Diagnosis present

## 2017-01-31 DIAGNOSIS — F1010 Alcohol abuse, uncomplicated: Secondary | ICD-10-CM | POA: Diagnosis present

## 2017-01-31 DIAGNOSIS — R29810 Facial weakness: Secondary | ICD-10-CM | POA: Diagnosis present

## 2017-01-31 DIAGNOSIS — Z7902 Long term (current) use of antithrombotics/antiplatelets: Secondary | ICD-10-CM

## 2017-01-31 DIAGNOSIS — I6340 Cerebral infarction due to embolism of unspecified cerebral artery: Secondary | ICD-10-CM | POA: Diagnosis not present

## 2017-01-31 DIAGNOSIS — Z79899 Other long term (current) drug therapy: Secondary | ICD-10-CM

## 2017-01-31 DIAGNOSIS — I6621 Occlusion and stenosis of right posterior cerebral artery: Secondary | ICD-10-CM | POA: Diagnosis present

## 2017-01-31 DIAGNOSIS — I2510 Atherosclerotic heart disease of native coronary artery without angina pectoris: Secondary | ICD-10-CM | POA: Diagnosis present

## 2017-01-31 DIAGNOSIS — R29709 NIHSS score 9: Secondary | ICD-10-CM | POA: Diagnosis not present

## 2017-01-31 DIAGNOSIS — R269 Unspecified abnormalities of gait and mobility: Secondary | ICD-10-CM | POA: Diagnosis not present

## 2017-01-31 DIAGNOSIS — F1910 Other psychoactive substance abuse, uncomplicated: Secondary | ICD-10-CM | POA: Diagnosis not present

## 2017-01-31 DIAGNOSIS — M109 Gout, unspecified: Secondary | ICD-10-CM | POA: Diagnosis present

## 2017-01-31 DIAGNOSIS — R471 Dysarthria and anarthria: Secondary | ICD-10-CM | POA: Diagnosis present

## 2017-01-31 DIAGNOSIS — F1410 Cocaine abuse, uncomplicated: Secondary | ICD-10-CM | POA: Diagnosis present

## 2017-01-31 DIAGNOSIS — E860 Dehydration: Secondary | ICD-10-CM

## 2017-01-31 DIAGNOSIS — I6322 Cerebral infarction due to unspecified occlusion or stenosis of basilar arteries: Secondary | ICD-10-CM | POA: Diagnosis not present

## 2017-01-31 DIAGNOSIS — I25810 Atherosclerosis of coronary artery bypass graft(s) without angina pectoris: Secondary | ICD-10-CM | POA: Diagnosis not present

## 2017-01-31 HISTORY — DX: Deficiency of other specified B group vitamins: E53.8

## 2017-01-31 HISTORY — DX: Pain in right knee: M25.561

## 2017-01-31 HISTORY — DX: Essential (primary) hypertension: I10

## 2017-01-31 HISTORY — DX: Gout, unspecified: M10.9

## 2017-01-31 HISTORY — DX: Cerebral infarction, unspecified: I63.9

## 2017-01-31 HISTORY — DX: Acute pancreatitis without necrosis or infection, unspecified: K85.90

## 2017-01-31 HISTORY — DX: Inflammatory liver disease, unspecified: K75.9

## 2017-01-31 LAB — RAPID URINE DRUG SCREEN, HOSP PERFORMED
AMPHETAMINES: NOT DETECTED
Barbiturates: NOT DETECTED
Benzodiazepines: NOT DETECTED
COCAINE: POSITIVE — AB
OPIATES: NOT DETECTED
TETRAHYDROCANNABINOL: NOT DETECTED

## 2017-01-31 LAB — CBC
HEMATOCRIT: 46.1 % (ref 39.0–52.0)
Hemoglobin: 15 g/dL (ref 13.0–17.0)
MCH: 29.9 pg (ref 26.0–34.0)
MCHC: 32.5 g/dL (ref 30.0–36.0)
MCV: 92 fL (ref 78.0–100.0)
PLATELETS: 233 10*3/uL (ref 150–400)
RBC: 5.01 MIL/uL (ref 4.22–5.81)
RDW: 14.2 % (ref 11.5–15.5)
WBC: 6.1 10*3/uL (ref 4.0–10.5)

## 2017-01-31 LAB — COMPREHENSIVE METABOLIC PANEL
ALBUMIN: 3.9 g/dL (ref 3.5–5.0)
ALT: 14 U/L — ABNORMAL LOW (ref 17–63)
ANION GAP: 9 (ref 5–15)
AST: 34 U/L (ref 15–41)
Alkaline Phosphatase: 44 U/L (ref 38–126)
BUN: 21 mg/dL — ABNORMAL HIGH (ref 6–20)
CHLORIDE: 110 mmol/L (ref 101–111)
CO2: 23 mmol/L (ref 22–32)
Calcium: 8.7 mg/dL — ABNORMAL LOW (ref 8.9–10.3)
Creatinine, Ser: 0.98 mg/dL (ref 0.61–1.24)
GFR calc non Af Amer: >60 mL/min (ref >60)
Glucose, Bld: 90 mg/dL (ref 65–99)
POTASSIUM: 3.9 mmol/L (ref 3.5–5.1)
SODIUM: 142 mmol/L (ref 135–145)
Total Bilirubin: 1.9 mg/dL — ABNORMAL HIGH (ref 0.3–1.2)
Total Protein: 6.5 g/dL (ref 6.5–8.1)

## 2017-01-31 LAB — DIFFERENTIAL
BASOS PCT: 0 %
Basophils Absolute: 0 10*3/uL (ref 0.0–0.1)
EOS ABS: 0 10*3/uL (ref 0.0–0.7)
EOS PCT: 1 %
LYMPHS PCT: 19 %
Lymphs Abs: 1.1 10*3/uL (ref 0.7–4.0)
MONO ABS: 0.9 10*3/uL (ref 0.1–1.0)
Monocytes Relative: 15 %
NEUTROS PCT: 65 %
Neutro Abs: 4 10*3/uL (ref 1.7–7.7)

## 2017-01-31 LAB — I-STAT CHEM 8, ED
BUN: 30 mg/dL — AB (ref 6–20)
CHLORIDE: 108 mmol/L (ref 101–111)
Calcium, Ion: 1.03 mmol/L — ABNORMAL LOW (ref 1.15–1.40)
Creatinine, Ser: 0.8 mg/dL (ref 0.61–1.24)
GLUCOSE: 88 mg/dL (ref 65–99)
HCT: 46 % (ref 39.0–52.0)
Hemoglobin: 15.6 g/dL (ref 13.0–17.0)
POTASSIUM: 4.3 mmol/L (ref 3.5–5.1)
Sodium: 143 mmol/L (ref 135–145)
TCO2: 26 mmol/L (ref 0–100)

## 2017-01-31 LAB — PHOSPHORUS
PHOSPHORUS: 3.3 mg/dL (ref 2.5–4.6)
PHOSPHORUS: 3.5 mg/dL (ref 2.5–4.6)

## 2017-01-31 LAB — URINALYSIS, ROUTINE W REFLEX MICROSCOPIC
BILIRUBIN URINE: NEGATIVE
GLUCOSE, UA: NEGATIVE mg/dL
Hgb urine dipstick: NEGATIVE
KETONES UR: 40 mg/dL — AB
Leukocytes, UA: NEGATIVE
NITRITE: NEGATIVE
PH: 5.5 (ref 5.0–8.0)
Protein, ur: NEGATIVE mg/dL
SPECIFIC GRAVITY, URINE: 1.02 (ref 1.005–1.030)

## 2017-01-31 LAB — MRSA PCR SCREENING: MRSA by PCR: NEGATIVE

## 2017-01-31 LAB — MAGNESIUM
MAGNESIUM: 2.4 mg/dL (ref 1.7–2.4)
Magnesium: 2.2 mg/dL (ref 1.7–2.4)

## 2017-01-31 LAB — I-STAT TROPONIN, ED: Troponin i, poc: 0.01 ng/mL (ref 0.00–0.08)

## 2017-01-31 LAB — PROTIME-INR
INR: 1.09
PROTHROMBIN TIME: 14.1 s (ref 11.4–15.2)

## 2017-01-31 LAB — ETHANOL

## 2017-01-31 LAB — APTT: APTT: 27 s (ref 24–36)

## 2017-01-31 LAB — GLUCOSE, CAPILLARY
GLUCOSE-CAPILLARY: 142 mg/dL — AB (ref 65–99)
Glucose-Capillary: 54 mg/dL — ABNORMAL LOW (ref 65–99)
Glucose-Capillary: 78 mg/dL (ref 65–99)

## 2017-01-31 MED ORDER — CHLORHEXIDINE GLUCONATE 0.12 % MT SOLN
15.0000 mL | Freq: Two times a day (BID) | OROMUCOSAL | Status: DC
  Administered 2017-01-31: 15 mL via OROMUCOSAL
  Filled 2017-01-31: qty 15

## 2017-01-31 MED ORDER — HEPARIN (PORCINE) IN NACL 100-0.45 UNIT/ML-% IJ SOLN
900.0000 [IU]/h | INTRAMUSCULAR | Status: DC
  Administered 2017-01-31: 800 [IU]/h via INTRAVENOUS
  Administered 2017-02-02: 700 [IU]/h via INTRAVENOUS
  Filled 2017-01-31 (×3): qty 250

## 2017-01-31 MED ORDER — ACETAMINOPHEN 650 MG RE SUPP: 650.0000 mg | RECTAL | Status: DC | PRN

## 2017-01-31 MED ORDER — ASPIRIN 300 MG RE SUPP: 300.0000 mg | Freq: Once | RECTAL | Status: DC

## 2017-01-31 MED ORDER — SENNOSIDES-DOCUSATE SODIUM 8.6-50 MG PO TABS: 1.0000 | ORAL_TABLET | Freq: Every evening | ORAL | Status: DC | PRN

## 2017-01-31 MED ORDER — ASPIRIN 81 MG PO CHEW: 324.0000 mg | CHEWABLE_TABLET | Freq: Once | ORAL | Status: DC

## 2017-01-31 MED ORDER — ACETAMINOPHEN 160 MG/5ML PO SOLN: 650.0000 mg | ORAL | Status: DC | PRN

## 2017-01-31 MED ORDER — HEPARIN (PORCINE) IN NACL 100-0.45 UNIT/ML-% IJ SOLN: 10.0000 [IU]/kg/h | INTRAMUSCULAR | Status: DC

## 2017-01-31 MED ORDER — PRO-STAT SUGAR FREE PO LIQD: 30.0000 mL | Freq: Two times a day (BID) | ORAL | Status: DC

## 2017-01-31 MED ORDER — SODIUM CHLORIDE 0.9 % IV SOLN
1.0000 g | INTRAVENOUS | Status: AC
  Administered 2017-01-31: 1 g via INTRAVENOUS
  Filled 2017-01-31: qty 10

## 2017-01-31 MED ORDER — ORAL CARE MOUTH RINSE
15.0000 mL | Freq: Two times a day (BID) | OROMUCOSAL | Status: DC
  Administered 2017-01-31: 15 mL via OROMUCOSAL

## 2017-01-31 MED ORDER — HEPARIN SODIUM (PORCINE) 5000 UNIT/ML IJ SOLN
5000.0000 [IU] | Freq: Three times a day (TID) | INTRAMUSCULAR | Status: DC
  Administered 2017-01-31: 5000 [IU] via SUBCUTANEOUS
  Filled 2017-01-31: qty 1

## 2017-01-31 MED ORDER — VITAL HIGH PROTEIN PO LIQD
1000.0000 mL | ORAL | Status: DC
  Filled 2017-01-31 (×2): qty 1000

## 2017-01-31 MED ORDER — ACETAMINOPHEN 325 MG PO TABS: 650.0000 mg | ORAL_TABLET | ORAL | Status: DC | PRN

## 2017-01-31 MED ORDER — HYDRALAZINE HCL 20 MG/ML IJ SOLN: 10.0000 mg | Freq: Three times a day (TID) | INTRAMUSCULAR | Status: DC | PRN

## 2017-01-31 MED ORDER — STROKE: EARLY STAGES OF RECOVERY BOOK
Freq: Once | Status: DC
  Filled 2017-01-31: qty 1

## 2017-01-31 MED ORDER — IOPAMIDOL (ISOVUE-370) INJECTION 76%
INTRAVENOUS | Status: AC
  Administered 2017-01-31: 50 mL
  Filled 2017-01-31: qty 50

## 2017-01-31 MED ORDER — DEXTROSE 50 % IV SOLN
25.0000 mL | Freq: Once | INTRAVENOUS | Status: AC
  Administered 2017-01-31: 25 mL via INTRAVENOUS

## 2017-01-31 MED ORDER — SODIUM CHLORIDE 0.9 % IV SOLN
INTRAVENOUS | Status: DC
  Administered 2017-01-31: 15:00:00 via INTRAVENOUS

## 2017-01-31 NOTE — ED Provider Notes (Signed)
[facility]-EMERGENCY DEPT Provider Note   CSN: [id number] Arrival date & time: 01/31/17  [id number]     History   Chief Complaint Chief Complaint  Patient presents with  . Aphasia    HPI Ishmael HolterHorace C Haff is a 76 y.o. male.  HPI 76 year old man history of cerebellar stroke presents today with right-sided facial droop and weakness. Last known normal is Wednesday. Patient lives alone in an apartment. Per report, patient was noted to have right facial droop and weakness by neighbors yesterday. They called his son. Son went to check on him this morning and noted these abnormalities. Patient is a phasic.  Past Medical History:  Diagnosis Date  . Hypertension   . Stroke [facility](H[facility])     There are no active problems to display for this patient.   History reviewed. No pertinent surgical history.     Home Medications    Prior to Admission medications   Not on File    Family History No family history on file.  Social History Social History  Substance Use Topics  . Smoking status: Never Smoker  . Smokeless tobacco: Not on file  . Alcohol use Yes     Allergies   Patient has no known allergies.   Review of Systems Review of Systems  Unable to perform ROS: Other     Physical Exam Updated Vital Signs BP 140/89 (BP Location: Left Arm)   Pulse 84   Temp 98.7 F (37.1 C) (Oral)   Resp 14   Ht 1.778 m (5\' 10" )   Wt 79.8 kg (176 lb)   SpO2 95%   BMI 25.25 kg/m   Physical Exam  Constitutional: He is oriented to person, place, and time. He appears well-developed.  HENT:  Head: Normocephalic and atraumatic.  Right Ear: External ear normal.  Left Ear: External ear normal.  Nose: Nose normal.  Eyes: EOM are normal.  Neck: No tracheal deviation present.  Cardiovascular: Normal rate, regular rhythm, normal heart sounds and intact distal pulses.   Pulmonary/Chest: Effort normal and breath sounds normal.  Abdominal: Soft. Bowel sounds are normal.  Musculoskeletal:  Erythematous  area 2 x 3 cm noted right low back No spine abnormalities noted  No trauma noted on extremities  No skin breakdown noted  Neurological: He is alert and oriented to person, place, and time.  Patient unable to move right upper extremity antigravity. Right lower extremity with decreased strength. Right facial droop. Extraocular movements intact Right patellar reflex 2+ left patellar reflex 2+ Right brachioradialis 2+ left brachioradialis 1+  Skin: Skin is warm and dry.  Psychiatric: He has a normal mood and affect. His behavior is normal.  Nursing note and vitals reviewed.    ED Treatments / Results  Labs (all labs ordered are listed, but only abnormal results are displayed) Labs Reviewed  I-STAT CHEM 8, ED - Abnormal; Notable for the following:       Result Value   BUN 30 (*)    Calcium, Ion 1.03 (*)    All other components within normal limits  ETHANOL  PROTIME-INR  APTT  CBC  DIFFERENTIAL  COMPREHENSIVE METABOLIC PANEL  RAPID URINE DRUG SCREEN, HOSP PERFORMED  URINALYSIS, ROUTINE W REFLEX MICROSCOPIC  I-STAT TROPONIN, ED    EKG  EKG Interpretation  Date/Time:  Saturday January 31 2017 09:27:13 EDT Ventricular Rate:  81 PR Interval:    QRS Duration: 132 QT Interval:  438 QTC Calculation: 509 R Axis:   28 Text Interpretation:  Sinus rhythm Right  bundle branch block rbb is new from last tracing 10/28/08 Confirmed by Margarita Grizzleay, Yuridiana Formanek [phone]([id number]) on 01/31/2017 11:06:33 AM       Radiology Ct Head Wo Contrast  Result Date: 01/31/2017 CLINICAL DATA:  76 year old male with right facial droop, slurred speech and right-sided weakness. Prior right cerebellar stroke in 2010. EXAM: CT HEAD WITHOUT CONTRAST TECHNIQUE: Contiguous axial images were obtained from the base of the skull through the vertex without intravenous contrast. COMPARISON:  Prior CT scan of the head 10/28/2008 FINDINGS: Brain: Encephalomalacia of the right cerebellar hemisphere consistent with prior right PCA territory  infarct. Focus of hypoattenuation in the right corona radiata (image 16 series 3) is new compared to prior. Hypoattenuation in the left pons is also new compared to prior and favored to represent an acute/subacute infarct. Generalized cerebral and cerebellar volume loss. Mild chronic microvascular ischemic white matter disease. Mild ex vacuo ventriculomegaly. No evidence of intracranial hemorrhage. Vascular: Atherosclerotic calcifications in the bilateral cavernous carotid arteries. No hyperdense vessel sign. Skull: Normal. Negative for fracture or focal lesion. Sinuses/Orbits: No acute finding. Other: None. IMPRESSION: 1. Positive for ill-defined hypoattenuation in the left hemi pons concerning for an acute/subacute infarct. 2. Less specific hypoattenuation in the right corona radiata is new compared to 2010 and likely represents a region of ischemic insult which has occurred sometime in the interim. This may be chronic, or subacute. 3. Sequelae of prior right PCA territory infarct with resultant encephalomalacia. 4. Intracranial atherosclerosis. These results were called by telephone at the time of interpretation on 01/31/2017 at 10:28 am to Dr. Margarita GrizzleANIELLE Briley Sulton , who verbally acknowledged these results. Electronically Signed   By: Malachy MoanHeath  McCullough M.D.   On: 01/31/2017 10:30    Procedures Procedures (including critical care time)  Medications Ordered in ED Medications - No data to display   Initial Impression / Assessment and Plan / ED Course  I have reviewed the triage vital signs and the nursing notes.  Pertinent labs & imaging results that were available during my care of the patient were reviewed by me and considered in my medical decision making (see chart for details).     Discussed with Dr.Hobbs and will see.  Awaiting consult from neurology.  Final Clinical Impressions(s) / ED Diagnoses   Final diagnoses:  Cerebrovascular accident (CVA), unspecified mechanism (HCC)    New  Prescriptions New Prescriptions   No medications on file     Margarita Grizzleay, Lavenia Stumpo, MD 01/31/17 1107

## 2017-01-31 NOTE — Progress Notes (Signed)
PT Cancellation Note  Patient Details Name: Brandon Howard MRN: [id number] DOB: 10-Dec-1940   Cancelled Treatment:    Reason Eval/Treat Not Completed: Patient at procedure or test/unavailable. Pt off of the floor for a stat CT. PT will continue to f/u with pt as available.   Alessandra BevelsJennifer M Arturo Sofranko 01/31/2017, 3:12 PM

## 2017-01-31 NOTE — ED Triage Notes (Signed)
Per ems- pt comes from home where he lives alone. Yesterday at 3 pm the neighbors noticed he was acting "strange" had slurred speech. Called his son and reported the change, he came to check on him this morning. He has right facial droop, slurred speech, right sided weakness. Pt is a x 4. Reports these symptoms started Thursday afternoon. Has hx of CVA. BP 155/91, HR 71, RR 16, 96% RA, CBG 96.

## 2017-01-31 NOTE — ED Notes (Signed)
Pt returned from MRI. Is on the way to [address]. Floor made aware.

## 2017-01-31 NOTE — Progress Notes (Signed)
ANTICOAGULATION CONSULT NOTE - Initial Consult  Pharmacy Consult for heparin Indication: stroke  No Known Allergies  Patient Measurements: Height: 5\' 7"  (170.2 cm) Weight: 151 lb 12.8 oz (68.9 kg) IBW/kg (Calculated) : 66.1 Heparin Dosing Weight: 68.9kg  Vital Signs: Temp: 97.6 F (36.4 C) (08/04 1500) Temp Source: Oral (08/04 1500) BP: 162/87 (08/04 1500) Pulse Rate: 77 (08/04 1500)  Labs:  Recent Labs  01/31/17 0940 01/31/17 0949  HGB 15.0 15.6  HCT 46.1 46.0  PLT 233  --   APTT 27  --   LABPROT 14.1  --   INR 1.09  --   CREATININE 0.98 0.80    Estimated Creatinine Clearance: 73.4 mL/min (by C-G formula based on SCr of 0.8 mg/dL).   Medical History: Past Medical History:  Diagnosis Date  . Hypertension   . Stroke [facility](HCC)     Medications:  Infusions:  . sodium chloride 50 mL/hr at 01/31/17 1522  . calcium gluconate 1 g (01/31/17 1527)  . heparin      Assessment: 75 yom presented to the hospital with right sided weakness found to have an acute stroke. To start IV heparin per neurology. Baseline CBC is WNL and he is not on anticoagulation PTA. To stop if patient has worsening mental status/severe headache.  Goal of Therapy:  Heparin level 0.3-0.5 units/ml Monitor platelets by anticoagulation protocol: Yes   Plan:  Heparin gtt 800 units/hr - NO BOLUS Check an 8 hr heparin level Daily heparin level and CBC  Jayesh Marbach, Drake Leachachel Lynn 01/31/2017,3:47 PM

## 2017-01-31 NOTE — Consult Note (Addendum)
Requesting Physician: Dr. Melynda RippleHobbs    Chief Complaint: Right sided weakness and aphasia  History obtained from:  Chart and family  HPI:                                                                                                                                      Brandon Howard is an 76 y.o. male with a past medical history significant for CVA with residual right arm weakness and hypertension who [facility] with complaints of worsening right sided weakness and trouble speaking. His last known normal was Wednesday 01/21/17. [name] noticed a significant worsening of his speech and right sided weakness over the last two days, although his son reports that [name]'s speech has worsened significantly since approximately 1000.  [name]. Based on the initial timing of last known well, a code stroke was not activated. On my visit, he very clear has things to say, but he is very difficult to understand. I am only able to make out a few words he states. He does endorse being very hungry, stating that he ate this morning.  At baseline, [name] lives independently in an independent living facility. Following is previous stroke he had trouble walking and right arm weakness. His son states that he walks fairly well now, only occasionally using a cane or walker.  Pertinent Imaging: MRI/MRA head/neck 01/31/17:       1. 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 The narrowing is irregular and fairly long and could be thrombus and or irregular plaque. Diminished flow in the distal basilar and irregular right PCA. Left PCA is fetal type.      2. Acute infarcts in the left more than right pons, bilateral cerebellum, and right thalamus.      3. Remote infarcts in the upper right cerebellum and left corpus callosum genu.      4. Patent carotid and vertebral arteries in the neck. 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 CT head  01/31/17: Positive for ill-defined hypoattenuation in the left hemi pons concerning for an acute/subacute infarct. Less specific hypoattenuation in the right corona radiata is new compared to 2010. Sequelae of prior right PCA territory infarct CTA head/neck 01/31/17: Severe posterior circulation atherosclerosis. 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 Reconstituted flow in the distal Basilar, but proximal right PCA stenosis and poor flow throughout the right PCA..  Date last known well: Date: 01/21/2017 Time last known well: Unable to determine  tPA Given: No: MRI changes already visible  Modified Rankin Score: 1  Stroke Risk Factors - hypertension   Past Medical History:  Diagnosis Date  . Hypertension   . Stroke [facility](HCC)     History reviewed. No pertinent surgical history.  No family history on file.  reports that he has never smoked. He does not have any smokeless tobacco history on file. He reports that he drinks alcohol. His drug history is not on file.  No Known Allergies  Medications:                                                                                                                       Current Meds  Medication Sig  . allopurinol (ZYLOPRIM) 100 MG tablet Take 100 mg by mouth daily.  Marland Kitchen amLODipine (NORVASC) 10 MG tablet Take 10 mg by mouth daily.  . clopidogrel (PLAVIX) 75 MG tablet Take 75 mg by mouth daily.  Marland Kitchen lisinopril (PRINIVIL,ZESTRIL) 20 MG tablet Take 20 mg by mouth daily.  . simvastatin (ZOCOR) 40 MG tablet Take 40 mg by mouth every evening.    Review Of Systems:                                                                                                           History obtained from unobtainable from patient due to severe dysarthria  Blood pressure (!) 175/84, pulse 81, temperature 98.1 F (36.7 C), temperature source Oral, resp. rate 12,  height 5\' 7"  (1.702 m), weight 68.9 kg (151 lb 12.8 oz), SpO2 96 %.   Physical Examination:                                                                                                      General: WDWN male.  HEENT:  Normocephalic, no lesions, without obvious abnormality.  Normal external eye and conjunctiva.  Normal external ears. Normal external nose, mucus membranes and septum.  Normal pharynx. Cardiovascular: S1, S2 normal, pulses palpable throughout   Pulmonary: Unlabored Abdomen: Soft, non-tender Musculoskeletal/Extremities: no joint deformities, effusion, or inflammation. Tone and bulk tone throughout; no atrophy noted Skin: warm and dry, no hyperpigmentation, vitiligo, or suspicious lesions  Neurological Examination:  Mental Status: Brandon Howard is alert.  Speech severely dysarthric without evidence of aphasia. Able to follow 3-step commands without difficulty. Cranial Nerves: II: Visual fields grossly normal, pupils are equal, round, reactive to light and accommodation. III,IV, VI: Ptosis not present, extra-ocular muscle movements intact bilaterally V,VII: Smile and eyebrow raise are weak on the left side. Facial light touch and pinprick sensation intact bilaterally VIII: Hearing grossly intact IX,X: Uvula and palate rise symmetrically XI: SCM and bilateral shoulder shrug strength 5/5 XII: Midline tongue extension Motor: Right :     Upper extremity   3/5   Left:     Upper extremity   5/5          Lower extremity   4-/5    Lower extremity   4-/5 Pronator drift not present in the LUE. Drift present in the right leg. Sensory: Light touch intact throughout, bilaterally Deep Tendon Reflexes: 2+ and symmetric throughout Plantars: Right: downgoing   Left: downgoing Cerebellar: Finger-to-nose test without evidence of dysmetria or ataxia on the left. Ataxia on the right appears to be  due to weakness. Ataxic heel to shin test bilaterally, right > left Gait: Deferred  Lab Results: Basic Metabolic Panel:  Recent Labs Lab 01/31/17 0940 01/31/17 0949  NA 142 143  K 3.9 4.3  CL 110 108  CO2 23  --   GLUCOSE 90 88  BUN 21* 30*  CREATININE 0.98 0.80  CALCIUM 8.7*  --     Liver Function Tests:  Recent Labs Lab 01/31/17 0940  AST 34  ALT 14*  ALKPHOS 44  BILITOT 1.9*  PROT 6.5  ALBUMIN 3.9   No results for input(s): LIPASE, AMYLASE in the last 168 hours. No results for input(s): AMMONIA in the last 168 hours.  CBC:  Recent Labs Lab 01/31/17 0940 01/31/17 0949  WBC 6.1  --   NEUTROABS 4.0  --   HGB 15.0 15.6  HCT 46.1 46.0  MCV 92.0  --   PLT 233  --     Cardiac Enzymes: No results for input(s): CKTOTAL, CKMB, CKMBINDEX, TROPONINI in the last 168 hours.  Lipid Panel: No results for input(s): CHOL, TRIG, HDL, CHOLHDL, VLDL, LDLCALC in the last 168 hours.  CBG: No results for input(s): GLUCAP in the last 168 hours.  Microbiology: No results found for this or any previous visit.  Coagulation Studies:  Recent Labs  01/31/17 0940  LABPROT 14.1  INR 1.09    Imaging: Ct Head Wo Contrast  Result Date: 01/31/2017 CLINICAL DATA:  76 year old male with right facial droop, slurred speech and right-sided weakness. Prior right cerebellar stroke in 2010. EXAM: CT HEAD WITHOUT CONTRAST TECHNIQUE: Contiguous axial images were obtained from the base of the skull through the vertex without intravenous contrast. COMPARISON:  Prior CT scan of the head 10/28/2008 FINDINGS: Brain: Encephalomalacia of the right cerebellar hemisphere consistent with prior right PCA territory infarct. Focus of hypoattenuation in the right corona radiata (image 16 series 3) is new compared to prior. Hypoattenuation in the left pons is also new compared to prior and favored to represent an acute/subacute infarct. Generalized cerebral and cerebellar volume loss. Mild chronic  microvascular ischemic white matter disease. Mild ex vacuo ventriculomegaly. No evidence of intracranial hemorrhage. Vascular: Atherosclerotic calcifications in the bilateral cavernous carotid arteries. No hyperdense vessel sign. Skull: Normal. Negative for fracture or focal lesion. Sinuses/Orbits: No acute finding. Other: None. IMPRESSION: 1. Positive for ill-defined hypoattenuation in the left hemi pons concerning for an acute/subacute  infarct. 2. Less specific hypoattenuation in the right corona radiata is new compared to 2010 and likely represents a region of ischemic insult which has occurred sometime in the interim. This may be chronic, or subacute. 3. Sequelae of prior right PCA territory infarct with resultant encephalomalacia. 4. Intracranial atherosclerosis. These results were called by telephone at the time of interpretation on 01/31/2017 at 10:28 am to Dr. Margarita Grizzle , who verbally acknowledged these results. Electronically Signed   By: Malachy Moan M.D.   On: 01/31/2017 10:30   Mr Maxine Glenn Neck Wo Contrast  Result Date: 01/31/2017 CLINICAL DATA:  Right-sided weakness and slurred speech. EXAM: MRI HEAD WITHOUT CONTRAST MRA HEAD WITHOUT CONTRAST MRA NECK WITHOUT CONTRAST TECHNIQUE: Multiplanar, multiecho pulse sequences of the brain and surrounding structures were obtained without intravenous contrast. Angiographic images of the Circle of Willis were obtained using MRA technique without intravenous contrast. Angiographic images of the neck were obtained using MRA technique without intravenous contrast. Carotid stenosis measurements (when applicable) are obtained utilizing NASCET criteria, using the distal internal carotid diameter as the denominator. COMPARISON:  10/29/2008 brain MRI.  Head CT from earlier today FINDINGS: MRI HEAD FINDINGS Brain: Confluent acute infarct in the left paramedian pons and to a lesser degree in the right paramedian pons. Clustered small acute infarcts in the bilateral  peripheral cerebellum. Acute lacunar infarct in the right thalamus. No acute noted anterior circulation infarcts. There is extensive remote right cerebellar infarction, mainly affecting the upper hemisphere and peduncles. Chronic microvascular ischemia in the cerebral white matter, confluent around the lateral ventricles. No acute hemorrhage, hydrocephalus, or masslike findings. Single remote microhemorrhage in the central right cerebellum. Remote lacunar infarct in the left genu of corpus callosum. Vascular: There is a blurred appearance of the mid basilar on T1 and T2 weighted imaging. See below. Skull and upper cervical spine: Cervical facet arthropathy. Negative for marrow lesion. Sinuses/Orbits: Negative MRA HEAD FINDINGS There are symmetric carotid arteries. Fetal type left PCA. Mild anterior circulation atheromatous changes. Symmetric atheromatous vertebral arteries. In the proximal and mid basilar is critical stenosis with 2 flow gaps. The abnormality has a central luminal appearance concerning for thrombus or irregular/ulcerated plaque. There is no right posterior communicating artery. Diminutive flow seen in the distal basilar and in the regular right PCA. Normal flow in the left PCA which is served by posterior communicating artery. No evidence of intramural hematoma on conventional brain MRI to suggest intracranial dissection. MRA NECK FINDINGS Limited noncontrast neck MRA. There is atherosclerosis of the bifurcations with at least moderate (50%) left ICA stenosis at the bulb. The visible aorta arch is normal caliber. There is antegrade flow in both vertebral arteries. The right vertebral artery ostium was not covered. Critical Value/emergent results were called by telephone at the time of interpretation on 01/31/2017 at 1:33 pm to Dr. Aneta Mins HOBBS, who verbally acknowledged these results. IMPRESSION: 1. 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 The narrowing is irregular and fairly long and could  be thrombus and or irregular plaque. Diminished flow in the distal basilar and irregular right PCA. Left PCA is fetal type. 2. Acute infarcts in the left more than right pons, bilateral cerebellum, and right thalamus. 3. Remote infarcts in the upper right cerebellum and left corpus callosum genu. 4. Patent carotid and vertebral arteries in the neck. 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 Electronically Signed   By: Marnee Spring M.D.   On: 01/31/2017 13:42   Mr Brain Wo Contrast  Result  Date: 01/31/2017 CLINICAL DATA:  Right-sided weakness and slurred speech. EXAM: MRI HEAD WITHOUT CONTRAST MRA HEAD WITHOUT CONTRAST MRA NECK WITHOUT CONTRAST TECHNIQUE: Multiplanar, multiecho pulse sequences of the brain and surrounding structures were obtained without intravenous contrast. Angiographic images of the Circle of Willis were obtained using MRA technique without intravenous contrast. Angiographic images of the neck were obtained using MRA technique without intravenous contrast. Carotid stenosis measurements (when applicable) are obtained utilizing NASCET criteria, using the distal internal carotid diameter as the denominator. COMPARISON:  10/29/2008 brain MRI.  Head CT from earlier today FINDINGS: MRI HEAD FINDINGS Brain: Confluent acute infarct in the left paramedian pons and to a lesser degree in the right paramedian pons. Clustered small acute infarcts in the bilateral peripheral cerebellum. Acute lacunar infarct in the right thalamus. No acute noted anterior circulation infarcts. There is extensive remote right cerebellar infarction, mainly affecting the upper hemisphere and peduncles. Chronic microvascular ischemia in the cerebral white matter, confluent around the lateral ventricles. No acute hemorrhage, hydrocephalus, or masslike findings. Single remote microhemorrhage in the central right cerebellum. Remote lacunar infarct in the left genu of corpus callosum. Vascular: There is a blurred appearance  of the mid basilar on T1 and T2 weighted imaging. See below. Skull and upper cervical spine: Cervical facet arthropathy. Negative for marrow lesion. Sinuses/Orbits: Negative MRA HEAD FINDINGS There are symmetric carotid arteries. Fetal type left PCA. Mild anterior circulation atheromatous changes. Symmetric atheromatous vertebral arteries. In the proximal and mid basilar is critical stenosis with 2 flow gaps. The abnormality has a central luminal appearance concerning for thrombus or irregular/ulcerated plaque. There is no right posterior communicating artery. Diminutive flow seen in the distal basilar and in the regular right PCA. Normal flow in the left PCA which is served by posterior communicating artery. No evidence of intramural hematoma on conventional brain MRI to suggest intracranial dissection. MRA NECK FINDINGS Limited noncontrast neck MRA. There is atherosclerosis of the bifurcations with at least moderate (50%) left ICA stenosis at the bulb. The visible aorta arch is normal caliber. There is antegrade flow in both vertebral arteries. The right vertebral artery ostium was not covered. Critical Value/emergent results were called by telephone at the time of interpretation on 01/31/2017 at 1:33 pm to Dr. Aneta Mins HOBBS, who verbally acknowledged these results. IMPRESSION: 1. 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 The narrowing is irregular and fairly long and could be thrombus and or irregular plaque. Diminished flow in the distal basilar and irregular right PCA. Left PCA is fetal type. 2. Acute infarcts in the left more than right pons, bilateral cerebellum, and right thalamus. 3. Remote infarcts in the upper right cerebellum and left corpus callosum genu. 4. Patent carotid and vertebral arteries in the neck. 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 Electronically Signed   By: Marnee Spring M.D.   On: 01/31/2017 13:42   Mr Maxine Glenn Head Wo Contrast  Result Date: 01/31/2017 CLINICAL DATA:   Right-sided weakness and slurred speech. EXAM: MRI HEAD WITHOUT CONTRAST MRA HEAD WITHOUT CONTRAST MRA NECK WITHOUT CONTRAST TECHNIQUE: Multiplanar, multiecho pulse sequences of the brain and surrounding structures were obtained without intravenous contrast. Angiographic images of the Circle of Willis were obtained using MRA technique without intravenous contrast. Angiographic images of the neck were obtained using MRA technique without intravenous contrast. Carotid stenosis measurements (when applicable) are obtained utilizing NASCET criteria, using the distal internal carotid diameter as the denominator. COMPARISON:  10/29/2008 brain MRI.  Head CT from earlier today FINDINGS:  MRI HEAD FINDINGS Brain: Confluent acute infarct in the left paramedian pons and to a lesser degree in the right paramedian pons. Clustered small acute infarcts in the bilateral peripheral cerebellum. Acute lacunar infarct in the right thalamus. No acute noted anterior circulation infarcts. There is extensive remote right cerebellar infarction, mainly affecting the upper hemisphere and peduncles. Chronic microvascular ischemia in the cerebral white matter, confluent around the lateral ventricles. No acute hemorrhage, hydrocephalus, or masslike findings. Single remote microhemorrhage in the central right cerebellum. Remote lacunar infarct in the left genu of corpus callosum. Vascular: There is a blurred appearance of the mid basilar on T1 and T2 weighted imaging. See below. Skull and upper cervical spine: Cervical facet arthropathy. Negative for marrow lesion. Sinuses/Orbits: Negative MRA HEAD FINDINGS There are symmetric carotid arteries. Fetal type left PCA. Mild anterior circulation atheromatous changes. Symmetric atheromatous vertebral arteries. In the proximal and mid basilar is critical stenosis with 2 flow gaps. The abnormality has a central luminal appearance concerning for thrombus or irregular/ulcerated plaque. There is no right  posterior communicating artery. Diminutive flow seen in the distal basilar and in the regular right PCA. Normal flow in the left PCA which is served by posterior communicating artery. No evidence of intramural hematoma on conventional brain MRI to suggest intracranial dissection. MRA NECK FINDINGS Limited noncontrast neck MRA. There is atherosclerosis of the bifurcations with at least moderate (50%) left ICA stenosis at the bulb. The visible aorta arch is normal caliber. There is antegrade flow in both vertebral arteries. The right vertebral artery ostium was not covered. Critical Value/emergent results were called by telephone at the time of interpretation on 01/31/2017 at 1:33 pm to Dr. Aneta MinsPHILLIP HOBBS, who verbally acknowledged these results. IMPRESSION: 1. 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 The narrowing is irregular and fairly long and could be thrombus and or irregular plaque. Diminished flow in the distal basilar and irregular right PCA. Left PCA is fetal type. 2. Acute infarcts in the left more than right pons, bilateral cerebellum, and right thalamus. 3. Remote infarcts in the upper right cerebellum and left corpus callosum genu. 4. Patent carotid and vertebral arteries in the neck. 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 Electronically Signed   By: Marnee SpringJonathon  Watts M.D.   On: 01/31/2017 13:42    Assessment  [name] is a 76 year old male with a past medical history that is significant for CVA with residual right arm weakness and hypertension [facility] worsening right arm weakness and dysarthria that appears to have worsened significantly this morning. Imaging showed significant posterior circulation atherosclerosis and stenosis particularly in the basilar artery. This was confirmed with CTA. The decision to forego IR at this time was primarily because T2 FLAIR and DWI showed pontine changes, indicating that we could not impact the infarct by opening the vessel while the  risks associated with the procedure remained. [name] was moved to the ICU for closer attention just in case intervention is needed. Proceed with routine stroke workup.  Plan:  1. HgbA1c, fasting lipid panel 3. PT consult, OT consult, Speech consult 4. Echocardiogram 5. Carotid dopplers 6. Prophylactic therapy-Antiplatelet med: Aspirin - dose 325, started patient on heparin drip non bolus protocol  7. Risk factor modification 8. Telemetry monitoring 9. Frequent neuro checks 10. NPO until passes stroke swallow screen   Leonel RamsayJamie Aldrige PA-C [facility] [phone]  01/31/2017, 1:55 PM    NEUROHOSPITALIST ADDENDUM [name] is a 76 year old male past medical  history for CVA with residual right arm, hypertension who presented today to the ER after she woke up with severe dysarthria.  It is unclear as to why the patient is not stroke alert but likely had duodenal unclear last known normal, was initially said to be Wednesday.  After speaking with the patient and son, it appears that he woke up today around 10 in the morning and noticed that he has severe dysarthria and worsening of his right-sided weakness. He called his son and said help me - and the son notified EMS. 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  I immediately examined the patient once the once he came back from MRI, and his exam was stable when compared to his symptoms this morning. He had severe dysarthria, 3 out of 5 strength in the right upper extremity 4 out of 5 strength of the lower extremity, bilateral ataxia and reduced sensation on the right side.His baseline exam is 4/5 strength in the right upper extremity and right side sensory symptoms.  I ordered a CT head and neck for  better visualization of the filling defect in the MRA the basilar artery. CTA head and neck showed severe mid basilar stenosis with a subocclusive thrombus with flow distal to the mid basilar segment. Patient has left fetal PCA, unlikely filling was retrograde in nature. He also has severe vertebral artery stenosis.  The patient was not a TPA candidate as he was clearly well outside the window. Patient was also outside not a candidate for endovascular therapy as he already has a well-established infarct on MRI brain ( no DWI to FLAIR  Mismatch).  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 We'll start the patient on non-bolus heparin drip for  24- 48 hrs stabilization of the clot in the basilar artery. We will perform close monitoring in the ICU setting and if the patient clinically worsens will obtain a stat CTA head and neck reevaluated the patient is a candidate for mechanical thrombectomy. Also give dose of aspirin 300 mg rectally   As far as further stroke workup, and Echo is ordered. HbA1c and lipid profile has been ordered. Eventually the patient will need to be placed on dual antiplatelets. She may benefit from obtaining TCD to assess degree of basilar stenosis and whether angioplasty in the future may be warranted if he fails maximum medical therapy.   I spent an hour and 10 minutes providing critical care treatment for this patient including diagnosing critical decision-making, treatment and counseling the family.   Georgiana Spinner Prabhleen Montemayor MD [facility] [phone]  If 7pm to 7am, please call on call as listed on AMION.

## 2017-01-31 NOTE — ED Notes (Signed)
Patient transported to CT 

## 2017-01-31 NOTE — Progress Notes (Signed)
New Admission Note:  Arrival Method: on stretcher from ER Mental Orientation: alert x 4 ; speech very hard to understand/slurred Telemetry:[id number] Assessment: Completed Skin: see flow sheet. bruising over several parts of he body and moisture associated damage on buttocks medially,.  IV:L forearm  Pain:0/10 Safety Measures: Safety Fall Prevention Plan was given, discussed. Admission: Completed 5M04: Patient has been orientated to the room, unit and the staff. Family:updated  Orders have been reviewed and implemented. Will continue to monitor the patient. Call light has been placed within reach and bed alarm has been activated.   Lawernce IonYari Ellar Hakala ,RN

## 2017-01-31 NOTE — ED Notes (Signed)
Patient transported to MRI 

## 2017-01-31 NOTE — H&P (Signed)
[facility] History and Physical  Brandon HolterHorace C Lince ZOX:[id number] DOB: 09-Jan-1941 DOA: 01/31/2017  Referring physician:  PCP: Fleet ContrasAvbuere, Edwin, MD   Chief Complaint: Right-sided weakness  HPI: Brandon Howard is a 76 y.o. male  past medical history significant for hypertension and stroke. Presents emergency room with right-sided weakness and slurred speech. Patient states symptoms started yesterday. Did not call for assistance until today. Difficult walking. EMS activated. Brought patient to the hospital for evaluation. Patient symptoms have not worsened or improved. Gaylyn RongHa snot been taking asa at home.  ED course: CT of the head revealed new stroke. Hospitalist consulted for admission.   Review of Systems:  As per HPI otherwise 10 point review of systems negative.    Past Medical History:  Diagnosis Date  . Hypertension   . Stroke [facility](HCC)    History reviewed. No pertinent surgical history. Social History:  reports that he has never smoked. He does not have any smokeless tobacco history on file. He reports that he drinks alcohol. His drug history is not on file.  No Known Allergies  No family history on file.   Prior to Admission medications   Not on File   Physical Exam: Vitals:   01/31/17 0924 01/31/17 0933 01/31/17 1015 01/31/17 1030  BP:  140/89 139/84 (!) 138/92  Pulse:  84 76 77  Resp:  14 17 17   Temp:  98.7 F (37.1 C)    TempSrc:  Oral    SpO2:  95% 96% 95%  Weight: 79.8 kg (176 lb)     Height: 5\' 10"  (1.778 m)       Wt Readings from Last 3 Encounters:  01/31/17 79.8 kg (176 lb)    General:  Appears calm and comfortable;Alert and oriented 3 Eyes:  PERRL, EOMI, normal lids, iris ENT:  grossly normal hearing, lips & tongue Neck:  no LAD, masses or thyromegaly Cardiovascular:  RRR, no m/r/g. No LE edema.  Respiratory:  CTA bilaterally, no w/r/r. Normal respiratory effort. Abdomen:  soft, ntnd Skin:  no rash or induration seen on limited  exam Musculoskeletal:  grossly normal tone BUE/BLE Psychiatric:  grossly normal mood and affect, speech fluent and appropriate Neurologic:  CN 2-12 grossly intact except right facial droop and slurred speech, moves all extremities in coordinated fashion on the left but not the right           Labs on Admission:  Basic Metabolic Panel:  Recent Labs Lab 01/31/17 0940 01/31/17 0949  NA 142 143  K 3.9 4.3  CL 110 108  CO2 23  --   GLUCOSE 90 88  BUN 21* 30*  CREATININE 0.98 0.80  CALCIUM 8.7*  --    Liver Function Tests:  Recent Labs Lab 01/31/17 0940  AST 34  ALT 14*  ALKPHOS 44  BILITOT 1.9*  PROT 6.5  ALBUMIN 3.9   No results for input(s): LIPASE, AMYLASE in the last 168 hours. No results for input(s): AMMONIA in the last 168 hours. CBC:  Recent Labs Lab 01/31/17 0940 01/31/17 0949  WBC 6.1  --   NEUTROABS 4.0  --   HGB 15.0 15.6  HCT 46.1 46.0  MCV 92.0  --   PLT 233  --    Cardiac Enzymes: No results for input(s): CKTOTAL, CKMB, CKMBINDEX, TROPONINI in the last 168 hours.  BNP (last 3 results) No results for input(s): BNP in the last 8760 hours.  ProBNP (last 3 results) No results for input(s): PROBNP in the last 8760  hours.   Serum creatinine: 0.8 mg/dL 16/03/9607/04/18 [id number] Estimated creatinine clearance: 81.1 mL/min  CBG: No results for input(s): GLUCAP in the last 168 hours.  Radiological Exams on Admission: Ct Head Wo Contrast  Result Date: 01/31/2017 CLINICAL DATA:  76 year old male with right facial droop, slurred speech and right-sided weakness. Prior right cerebellar stroke in 2010. EXAM: CT HEAD WITHOUT CONTRAST TECHNIQUE: Contiguous axial images were obtained from the base of the skull through the vertex without intravenous contrast. COMPARISON:  Prior CT scan of the head 10/28/2008 FINDINGS: Brain: Encephalomalacia of the right cerebellar hemisphere consistent with prior right PCA territory infarct. Focus of hypoattenuation in the right corona  radiata (image 16 series 3) is new compared to prior. Hypoattenuation in the left pons is also new compared to prior and favored to represent an acute/subacute infarct. Generalized cerebral and cerebellar volume loss. Mild chronic microvascular ischemic white matter disease. Mild ex vacuo ventriculomegaly. No evidence of intracranial hemorrhage. Vascular: Atherosclerotic calcifications in the bilateral cavernous carotid arteries. No hyperdense vessel sign. Skull: Normal. Negative for fracture or focal lesion. Sinuses/Orbits: No acute finding. Other: None. IMPRESSION: 1. Positive for ill-defined hypoattenuation in the left hemi pons concerning for an acute/subacute infarct. 2. Less specific hypoattenuation in the right corona radiata is new compared to 2010 and likely represents a region of ischemic insult which has occurred sometime in the interim. This may be chronic, or subacute. 3. Sequelae of prior right PCA territory infarct with resultant encephalomalacia. 4. Intracranial atherosclerosis. These results were called by telephone at the time of interpretation on 01/31/2017 at 10:28 am to Dr. Margarita GrizzleANIELLE RAY , who verbally acknowledged these results. Electronically Signed   By: Malachy MoanHeath  McCullough M.D.   On: 01/31/2017 10:30    EKG: Independently reviewed. Right bundle branch block. Normal sinus rhythm. No STEMI.  Assessment/Plan Active Problems:   Stroke (cerebrum) (HCC)  Stroke CT head negative for acute bleed but does show a new stroke MRI/MRA ordered Aspirin full dose given  And daily A1c ordered Lipid panel ordered Admitted to telemetry bed Echo ordered Carotid Dopplers ordered Stroke team to follow patient, consult by EDP  Hypertension When necessary hydralazine 10 mg IV as needed for severe blood pressure Permissive HTN  Hypocalcemia We'll replete and recheck Check mag and phosphorus  Code Status: FULL  DVT Prophylaxis: heparin Family Communication: none available Disposition Plan:  Pending Improvement  Status: inpt tele  Haydee SalterPhillip M Hobbs, MD Family Medicine [facility] [email or web address] Password [email or web address]

## 2017-02-01 ENCOUNTER — Inpatient Hospital Stay (HOSPITAL_COMMUNITY): Payer: Medicare HMO

## 2017-02-01 DIAGNOSIS — I6789 Other cerebrovascular disease: Secondary | ICD-10-CM

## 2017-02-01 DIAGNOSIS — E860 Dehydration: Secondary | ICD-10-CM

## 2017-02-01 DIAGNOSIS — E876 Hypokalemia: Secondary | ICD-10-CM

## 2017-02-01 LAB — LIPID PANEL
CHOL/HDL RATIO: 2.6 ratio
CHOLESTEROL: 112 mg/dL (ref 0–200)
HDL: 43 mg/dL (ref >40)
LDL CALC: 56 mg/dL (ref 0–99)
TRIGLYCERIDES: 67 mg/dL (ref <150)
VLDL: 13 mg/dL (ref 0–40)

## 2017-02-01 LAB — BASIC METABOLIC PANEL
ANION GAP: 10 (ref 5–15)
BUN: 17 mg/dL (ref 6–20)
CO2: 26 mmol/L (ref 22–32)
CREATININE: 0.69 mg/dL (ref 0.61–1.24)
Calcium: 8.5 mg/dL — ABNORMAL LOW (ref 8.9–10.3)
Chloride: 106 mmol/L (ref 101–111)
GLUCOSE: 87 mg/dL (ref 65–99)
Potassium: 3.2 mmol/L — ABNORMAL LOW (ref 3.5–5.1)
Sodium: 142 mmol/L (ref 135–145)

## 2017-02-01 LAB — CBC
HEMATOCRIT: 46.9 % (ref 39.0–52.0)
Hemoglobin: 15.9 g/dL (ref 13.0–17.0)
MCH: 30.9 pg (ref 26.0–34.0)
MCHC: 33.9 g/dL (ref 30.0–36.0)
MCV: 91.1 fL (ref 78.0–100.0)
PLATELETS: 235 10*3/uL (ref 150–400)
RBC: 5.15 MIL/uL (ref 4.22–5.81)
RDW: 13.7 % (ref 11.5–15.5)
WBC: 6.2 10*3/uL (ref 4.0–10.5)

## 2017-02-01 LAB — ECHOCARDIOGRAM COMPLETE
Height: 67 in
WEIGHTICAEL: 2409.19 [oz_av]

## 2017-02-01 LAB — GLUCOSE, CAPILLARY
GLUCOSE-CAPILLARY: 91 mg/dL (ref 65–99)
GLUCOSE-CAPILLARY: 179 mg/dL — AB (ref 65–99)
Glucose-Capillary: 81 mg/dL (ref 65–99)
Glucose-Capillary: 102 mg/dL — ABNORMAL HIGH (ref 65–99)
Glucose-Capillary: 238 mg/dL — ABNORMAL HIGH (ref 65–99)

## 2017-02-01 LAB — PHOSPHORUS: Phosphorus: 3.4 mg/dL (ref 2.5–4.6)

## 2017-02-01 LAB — HEPARIN LEVEL (UNFRACTIONATED)
HEPARIN UNFRACTIONATED: 0.41 [IU]/mL (ref 0.30–0.70)
Heparin Unfractionated: 0.54 IU/mL (ref 0.30–0.70)

## 2017-02-01 LAB — MAGNESIUM: Magnesium: 2.1 mg/dL (ref 1.7–2.4)

## 2017-02-01 MED ORDER — SIMVASTATIN 40 MG PO TABS
40.0000 mg | ORAL_TABLET | Freq: Every evening | ORAL | Status: DC
  Administered 2017-02-01 – 2017-02-03 (×3): 40 mg via ORAL
  Filled 2017-02-01 (×3): qty 1

## 2017-02-01 MED ORDER — ALLOPURINOL 100 MG PO TABS
100.0000 mg | ORAL_TABLET | Freq: Every day | ORAL | Status: DC
  Administered 2017-02-01 – 2017-02-04 (×4): 100 mg via ORAL
  Filled 2017-02-01 (×4): qty 1

## 2017-02-01 MED ORDER — DEXTROSE-NACL 5-0.9 % IV SOLN
INTRAVENOUS | Status: DC
  Administered 2017-02-01 (×2): via INTRAVENOUS

## 2017-02-01 MED ORDER — ORAL CARE MOUTH RINSE: 15.0000 mL | Freq: Two times a day (BID) | OROMUCOSAL | Status: DC

## 2017-02-01 MED ORDER — ORAL CARE MOUTH RINSE
15.0000 mL | Freq: Two times a day (BID) | OROMUCOSAL | Status: DC
  Administered 2017-02-01 – 2017-02-04 (×4): 15 mL via OROMUCOSAL

## 2017-02-01 MED ORDER — RESOURCE THICKENUP CLEAR PO POWD
ORAL | Status: DC | PRN
  Administered 2017-02-02: 18:00:00 via ORAL
  Filled 2017-02-01: qty 125

## 2017-02-01 MED ORDER — CHLORHEXIDINE GLUCONATE 0.12 % MT SOLN
15.0000 mL | Freq: Two times a day (BID) | OROMUCOSAL | Status: DC
  Administered 2017-02-01: 15 mL via OROMUCOSAL

## 2017-02-01 MED ORDER — POTASSIUM CHLORIDE 10 MEQ/100ML IV SOLN
10.0000 meq | INTRAVENOUS | Status: AC
  Administered 2017-02-01 (×3): 10 meq via INTRAVENOUS
  Filled 2017-02-01 (×3): qty 100

## 2017-02-01 NOTE — Progress Notes (Signed)
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  I suspect cocaine induced vasospasms as the cause of posterior circulation infarcts.  I agree with short term IV heparin for about a week.  Repeat CTA Brain at that time should theoretically show resolution of the vasospasms.  If so, then transition to single antiplatelet therapy.  If still evidence of severe basilar stenosis, then consider either anticoagulation (no data to support) or dual antiplatelet therapy (also no data to support).  He will need extensive rehab.  TTE is pending, but doubt cardioembolism, although not impossible.     Weston Settle, MS, MD   HISTORY OF PRESENT ILLNESS (per record) Brandon Howard is an 76 y.o. male with a past medical history significant for CVA with residual right arm weakness and hypertension who presents to [facility] with complaints of worsening right sided weakness and trouble speaking. His last known normal was Wednesday 01/21/17. Brandon Howard noticed a significant worsening of his speech and right sided weakness over the last two days, although his son reports that Brandon Howard speech has worsened significantly since approximately 1000.  Brandon Howard was brought to [facility] for further evaluation. Based on the initial timing of last known well, a code stroke was not activated. On my visit, he very clear has things to say, but he is very difficult to understand. I am only able to make out a few words he states. He does endorse being very hungry, stating that he ate this morning.  At baseline, Brandon Howard lives independently in an independent living facility. Following is previous stroke he had trouble walking and right arm weakness. His son states that he walks fairly well now, only occasionally using a cane or walker.  Pertinent Imaging: MRI/MRA  head/neck 01/31/17:       1. 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 The narrowing is irregular and fairly long and could be thrombus and or irregular plaque. Diminished flow in the distal basilar and irregular right PCA. Left PCA is fetal type.      2. Acute infarcts in the left more than right pons, bilateral cerebellum, and right thalamus.      3. Remote infarcts in the upper right cerebellum and left corpus callosum genu.      4. Patent carotid and vertebral arteries in the neck. 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 CT head 01/31/17: Positive for ill-defined hypoattenuation in the left hemi pons concerning for an acute/subacute infarct. Less specific hypoattenuation in the right corona radiata is new compared to 2010. Sequelae of prior right PCA territory infarct CTA head/neck 01/31/17: Severe posterior circulation atherosclerosis. 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 Reconstituted flow in the distal Basilar, but proximal right PCA stenosis and poor flow throughout the right PCA..  Date last known well: Date: 01/21/2017 Time last known well: Unable to determine  tPA Given: No: MRI changes already visible  Modified Rankin Score: 1  Stroke Risk Factors - hypertension   SUBJECTIVE (INTERVAL HISTORY) Unable to understand easily due to severe dysarthria.  He said that he did cocaine weekly for the past 2 years and did it that morning of admission.  He said that he had right Bell's palsy 5 years ago.   OBJECTIVE Temp:  [97.6 F (36.4 C)-98.7 F (37.1  C)] 97.6 F (36.4 C) (08/05 0355) Pulse Rate:  [68-93] 73 (08/05 0700) Cardiac Rhythm: Normal sinus rhythm (08/05 0300) Resp:  [10-23] 13 (08/05 0700) BP: (115-175)/(53-128) 128/73 (08/05 0700) SpO2:  [89 %-100 %] 95 % (08/05 0700) Weight:  [150 lb 9.2 oz (68.3 kg)-176 lb (79.8 kg)] 150 lb 9.2 oz (68.3 kg) (08/05  0500)  CBC:  Recent Labs Lab 01/31/17 0940 01/31/17 0949 02/01/17 0524  WBC 6.1  --  6.2  NEUTROABS 4.0  --   --   HGB 15.0 15.6 15.9  HCT 46.1 46.0 46.9  MCV 92.0  --  91.1  PLT 233  --  235    Basic Metabolic Panel:  Recent Labs Lab 01/31/17 0940 01/31/17 0949 01/31/17 1627 02/01/17 0524  NA 142 143  --  142  K 3.9 4.3  --  3.2*  CL 110 108  --  106  CO2 23  --   --  26  GLUCOSE 90 88  --  87  BUN 21* 30*  --  17  CREATININE 0.98 0.80  --  0.69  CALCIUM 8.7*  --   --  8.5*  MG 2.2  --  2.4 2.1  PHOS 3.3  --  3.5 3.4    Lipid Panel:    Component Value Date/Time   CHOL 112 02/01/2017 0524   TRIG 67 02/01/2017 0524   HDL 43 02/01/2017 0524   CHOLHDL 2.6 02/01/2017 0524   VLDL 13 02/01/2017 0524   LDLCALC 56 02/01/2017 0524   HgbA1c:  Lab Results  Component Value Date   HGBA1C  10/27/2008    5.4 (NOTE) The ADA recommends the following therapeutic goal for glycemic control related to Hgb A1c measurement: Goal of therapy: <6.5 Hgb A1c  Reference: American Diabetes Association: Clinical Practice Recommendations 2010, Diabetes Care, 2010, 33: (Suppl  1).   Urine Drug Screen:    Component Value Date/Time   LABOPIA NONE DETECTED 01/31/2017 1627   COCAINSCRNUR POSITIVE (A) 01/31/2017 1627   LABBENZ NONE DETECTED 01/31/2017 1627   AMPHETMU NONE DETECTED 01/31/2017 1627   THCU NONE DETECTED 01/31/2017 1627   LABBARB NONE DETECTED 01/31/2017 1627    Alcohol Level     Component Value Date/Time   ETH <5 01/31/2017 0940    IMAGING  Ct Angio Head W Or Wo Contrast Ct Angio Neck W Or Wo Contrast 01/31/2017 1. Severe posterior circulation atherosclerosis. 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 Reconstituted flow in the distal Basilar, but proximal right PCA stenosis and poor flow throughout the right PCA.  2. Widespread and occasionally bulky plaque in both  carotid arteries including the ICA siphons, but no hemodynamically significant carotid stenosis in the neck. There is up to moderate stenosis of the left ICA siphon.  3. Otherwise negative anterior circulation.  4. Stable CT appearance of the brain since 1015 hours today.    Ct Head Wo Contrast 01/31/2017 1. Positive for ill-defined hypoattenuation in the left hemi pons concerning for an acute/subacute infarct.  2. Less specific hypoattenuation in the right corona radiata is new compared to 2010 and likely represents a region of ischemic insult which has occurred sometime in the interim. This may be chronic, or subacute.  3. Sequelae of prior right PCA territory infarct with resultant encephalomalacia.  4. Intracranial atherosclerosis.    Mr Maxine Howard Neck Wo Contrast Mr Maxine Howard Neck Wo Contrast 01/31/2017 1. Critical proximal basilar  stenosis with flow gap. The narrowing is irregular and fairly long and could be thrombus and or irregular plaque. Diminished flow in the distal basilar and irregular right PCA. Left PCA is fetal type.  2. Acute infarcts in the left more than right pons, bilateral cerebellum, and right thalamus.  3. Remote infarcts in the upper right cerebellum and left corpus callosum genu. 4. Patent carotid and vertebral arteries in the neck. 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     PHYSICAL EXAM  Await, alert.  Severe dysarthria.  Naming, repetition, comprehension - intact. Right upper and lower facial weakness.  Mildly weak eye closure on the right. RUE 3/5 LUE 5/5 BLE 5/5 Positive babinski bilateral. FTN intact on the left but unable to do on right due to weakness. Dysmetria on HTS bilaterally. EOMI. PERL.      ASSESSMENT/PLAN Brandon Howard is a 76 y.o. male with history of a previous stroke and hypertension presenting with worsening right-sided weakness and difficulty speaking. He did not receive IV t-PA due to late presentation.  Stroke:  Acute  infarcts in the left more than right pons, bilateral cerebellum, and right thalamus - embolic unknown source.  Resultant  Severe right UE weakness and severe dysarthria and mild incoordination.  CT head - Positive for ill-defined hypoattenuation in the left hemi pons concerning for an acute/subacute infarct.   MRI head - Acute infarcts in the left more than right pons, bilateral cerebellum, and right thalamus.   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  Carotid Doppler - CTA neck  2D Echo - pending  LDL - 56  HgbA1c - pending  VTE prophylaxis - IV heparin  Diet NPO time specified  No antithrombotic prior to admission, now on aspirin 300 mg suppository daily and heparin IV  Patient counseled to be compliant with his antithrombotic medications  Ongoing aggressive stroke risk factor management  Therapy recommendations: pending  Disposition: Pending  Hypertension  Stable - systolic blood pressure mildly low at times in the setting of basilar artery stenosis.  Permissive hypertension (OK if < 220/120) but gradually normalize in 5-7 days  Long-term BP goal normotensive  Hyperlipidemia  Home meds: Zocor 40 mg daily not resumed in hospital  LDL 56, goal < 70  Add Zocor when PO access is available.  Continue statin at discharge    Other Stroke Risk Factors  Advanced age  ETOH use, advised to drink no more than 1 drink a day   Hx stroke/TIA  Substance abuse   Other Active Problems  UDS - Cocaine positive   PLAN  May need to consider TEE - await TTE  Hypokalemia - supplement and recheck in AM  Hospital day # 1   To contact Stroke Continuity provider, please refer to [email or web address]. After hours, contact General Neurology

## 2017-02-01 NOTE — Progress Notes (Signed)
ANTICOAGULATION CONSULT NOTE Pharmacy Consult for heparin Indication: stroke  No Known Allergies  Patient Measurements: Height: 5\' 7"  (170.2 cm) Weight: 151 lb 12.8 oz (68.9 kg) IBW/kg (Calculated) : 66.1 Heparin Dosing Weight: 68.9kg  Vital Signs: Temp: 97.9 F (36.6 C) (08/05 0000) Temp Source: Oral (08/05 0000) BP: 149/74 (08/05 0200) Pulse Rate: 71 (08/05 0200)  Labs:  Recent Labs  01/31/17 0940 01/31/17 0949 02/01/17 0202  HGB 15.0 15.6  --   HCT 46.1 46.0  --   PLT 233  --   --   APTT 27  --   --   LABPROT 14.1  --   --   INR 1.09  --   --   HEPARINUNFRC  --   --  0.54  CREATININE 0.98 0.80  --     Estimated Creatinine Clearance: 73.4 mL/min (by C-G formula based on SCr of 0.8 mg/dL).  Assessment: 76 y.o. male with CVA for heparin   Goal of Therapy:  Heparin level 0.3-0.5 units/ml Monitor platelets by anticoagulation protocol: Yes   Plan:  Decrease Heparin 700 units/hr Check heparin level in 8 hours.   Fabiano Ginley, Gary FleetGregory Vernon 02/01/2017,2:44 AM

## 2017-02-01 NOTE — Evaluation (Signed)
Speech Language Pathology Evaluation Patient Details Name: Brandon Howard MRN: [id number] DOB: February 14, 1941 Today's Date: 02/01/2017 Time: [id number] SLP Time Calculation (min) (ACUTE ONLY): 17 min  Problem List:  Patient Active Problem List   Diagnosis Date Noted  . Stroke (cerebrum) (HCC) 01/31/2017   Past Medical History:  Past Medical History:  Diagnosis Date  . Hypertension   . Stroke [facility](HCC)    Past Surgical History: History reviewed. No pertinent surgical history. HPI:  Brandon ProvencalHorace C Underwoodis a 76 y.o.malepast medical history significant for hypertension and stroke. Presents emergency room with right-sided weakness and slurred speech. Patient states symptoms started yesterday. Did not call for assistance until today. Difficult walking. EMS activated. Brought patient to the hospital for evaluation. Patient symptoms have not worsened or improved. ED course: CT of the head revealed new stroke. MRI of head showinginfracrts left worse then right pons, bilateral cerebellum and right thalamus as well as remote infarcts in right cerebellum and left corpus collosum.   Assessment / Plan / Recommendation Clinical Impression  Cognitive/linguistic and motor speech screen were completed.  Oral mechanism exam revealed significant right sided facial weakness affecting the entire right side of the face as well as possible decreased sensory awareness.  Given lingual range of motion tasks right sided deviation was observed as well as weakness on the right side.  Weak labial seal noted as well.  Diadochokinetic rates were very slow and labored and patient's vocal volume was low.  Intelligiblity was best given single words.  Despite instruction to slow down and only say a few words at a time the patient often resorted to trying to speak in complete sentences which were almost completely unintelligible.  Using slow, over articulated speech for single words improved intellgibility.  Given the Mini Mental  State Exam the patient achieved a score of 20 out of a possible 28 points (writing and copying tasks were not administered due to the patient's inability to move his dominant arm).  The patient was able to write his full name with his non dominant hand with significantly decreased legibility.  The patient was oriented to person, place, time and situation.  Immediate recall of 3 novel words was good.  However, given short delay the patient was only able to independently recall 1/3.  Use of semantic cues and choices did faciliate recall of other 2 novel words.  He was able to name objects, repeat a short phrase and read/comprehend a short sentence.  He struggled to follow a single and multi step commands.  Use of verbal and visual cues did facilitate his ability to follow commands.  He also required cues to provide logical solutions to simple problems.  The patient presented with a moderate to severe dysarthria as well as cognitive/linguistic deficits that will require intense ST services at next level of care.  ST will follow during acute stay  Currently it may be difficult for the patient to live home alone without support given level of ST deficits.      SLP Assessment  SLP Recommendation/Assessment: Patient needs continued Speech Lanaguage Pathology Services SLP Visit Diagnosis: Dysphagia, oropharyngeal phase (R13.12)    Follow Up Recommendations  Other (comment) (Intense ST at next level of care.  )    Frequency and Duration min 2x/week  2 weeks      SLP Evaluation Cognition  Overall Cognitive Status: Impaired/Different from baseline Arousal/Alertness: Awake/alert Orientation Level: Oriented to person;Oriented to place;Oriented to time;Oriented to situation Attention: Sustained Sustained Attention: Impaired Sustained Attention Impairment:  Functional basic Memory: Impaired Memory Impairment: Decreased recall of new information (3/3 immediate recall  1/3 delayed recall) Awareness: Appears  intact Problem Solving: Impaired Problem Solving Impairment: Verbal basic Safety/Judgment: Appears intact       Comprehension  Auditory Comprehension Overall Auditory Comprehension: Impaired Commands: Impaired One Step Basic Commands: 50-74% accurate Multistep Basic Commands: 25-49% accurate Conversation: Simple Reading Comprehension Reading Status: Within funtional limits    Expression Expression Primary Mode of Expression: Verbal Verbal Expression Overall Verbal Expression: Impaired Initiation: Impaired Automatic Speech: Name;Social Response Level of Generative/Spontaneous Verbalization: Sentence Repetition: No impairment Naming: No impairment Pragmatics: No impairment Non-Verbal Means of Communication: Not applicable Written Expression Dominant Hand: Right Written Expression: Exceptions to [facility] (Pt able to with poor legibility write his full name ) Dictation Ability:  (wrote name with poor legibility non domininant hand)   Oral / Motor  Oral Motor/Sensory Function Overall Oral Motor/Sensory Function: Moderate impairment Facial ROM: Reduced right;Suspected CN VII (facial) dysfunction Facial Symmetry: Abnormal symmetry right;Suspected CN VII (facial) dysfunction Facial Strength: Reduced right;Suspected CN VII (facial) dysfunction Lingual ROM: Reduced right Lingual Symmetry: Abnormal symmetry right Lingual Strength: Reduced;Suspected CN XII (hypoglossal) dysfunction Mandible: Within Functional Limits Motor Speech Overall Motor Speech: Impaired Respiration: Impaired Level of Impairment: Phrase Phonation: Low vocal intensity Resonance: Within functional limits Articulation: Impaired Level of Impairment: Phrase Intelligibility: Intelligibility reduced Word: 25-49% accurate Phrase: 0-24% accurate Sentence: 0-24% accurate Conversation: 0-24% accurate Motor Planning: Witnin functional limits Motor Speech Errors: Not applicable Effective Techniques: Slow rate;Increased  vocal intensity;Over-articulate   GO                   Dimas AguasMelissa Alycia Cooperwood, MA, CCC-SLP Acute Rehab SLP [phone] Fleet ContrasMelissa N Rylinn Linzy 02/01/2017, 8:32 AM

## 2017-02-01 NOTE — Evaluation (Signed)
Clinical/Bedside Swallow Evaluation Patient Details  Name: Brandon Howard MRN: [id number] Date of Birth: 03-22-1941  Today's Date: 02/01/2017 Time: SLP Start Time (ACUTE ONLY): 0731 SLP Stop Time (ACUTE ONLY): 0745 SLP Time Calculation (min) (ACUTE ONLY): 14 min  Past Medical History:  Past Medical History:  Diagnosis Date  . Hypertension   . Stroke [facility](HCC)    Past Surgical History: History reviewed. No pertinent surgical history. HPI:  Brandon ProvencalHorace C Underwoodis a 76 y.o.malepast medical history significant for hypertension and stroke. Presents emergency room with right-sided weakness and slurred speech. Patient states symptoms started yesterday. Did not call for assistance until today. Difficult walking. EMS activated. Brought patient to the hospital for evaluation. Patient symptoms have not worsened or improved. ED course: CT of the head revealed new stroke. MRI of head showinginfracrts left worse then right pons, bilateral cerebellum and right thalamus as well as remote infarcts in right cerebellum and left corpus collosum.   Assessment / Plan / Recommendation Clinical Impression  Clinical swallowing evaluation was completed using thin liquids and pureed material in setting of new CVA.  Oral mechanism exam was completed and is remarkable for right sided facial, labial and lingual weakness.  Facial weakness effects entire right side of his face.  In addition, the patient's volitional cough was extremely weak.  The patient presented with possible oropharyngeal dysphagia given clinical exam.  The oral stage was characterized by delayed oral transit and decreased labial seal leading to anterior escape of liquids with no awareness by the patient.   The pharyngeal phase was characterized by delayed swallow trigger and multiple swallows to clear pureed boluses.  Immediate weak throat clear noted given cup sips of thin liquids.  Given results of clinical exam and reason for admission recommend that the  patient remain NPO pending results of an MBS to determine current swallowing physiology.   SLP Visit Diagnosis: Dysphagia, oropharyngeal phase (R13.12)    Aspiration Risk  Moderate aspiration risk    Diet Recommendation   NPO pending results of MBS.    Medication Administration: Via alternative means (pending MBS)    Other  Recommendations Oral Care Recommendations: Oral care QID   Follow up Recommendations Other (comment) (Intense ST at next level of care.  )      Frequency and Duration  To be determined pending MBS          Prognosis Prognosis for Safe Diet Advancement: Fair      Swallow Study   General Date of Onset: 01/31/17 HPI: Brandon ProvencalHorace C Underwoodis a 76 y.o.malepast medical history significant for hypertension and stroke. Presents emergency room with right-sided weakness and slurred speech. Patient states symptoms started yesterday. Did not call for assistance until today. Difficult walking. EMS activated. Brought patient to the hospital for evaluation. Patient symptoms have not worsened or improved. ED course: CT of the head revealed new stroke. MRI of head showinginfracrts left worse then right pons, bilateral cerebellum and right thalamus as well as remote infarcts in right cerebellum and left corpus collosum. Type of Study: Bedside Swallow Evaluation [facility]   Diet Prior to this Study: NPO Temperature Spikes Noted: No Respiratory Status: Room air History of Recent Intubation: No Behavior/Cognition: Alert;Cooperative;Requires cueing Oral Cavity Assessment: Within Functional Limits Oral Care Completed by SLP: No Self-Feeding Abilities: Needs assist Patient Positioning: Upright in bed Baseline Vocal Quality: Low vocal intensity Volitional Cough: Weak Volitional Swallow: Able to elicit    Oral/Motor/Sensory Function     Ice  Chips Ice chips: Impaired Presentation: Spoon Oral Phase Impairments: Impaired mastication Oral  Phase Functional Implications: Prolonged oral transit Pharyngeal Phase Impairments: Suspected delayed Swallow   Thin Liquid Thin Liquid: Impaired Presentation: Cup;Spoon;Self Fed Oral Phase Impairments: Impaired mastication;Poor awareness of bolus Oral Phase Functional Implications: Right anterior spillage;Prolonged oral transit Pharyngeal  Phase Impairments: Suspected delayed Swallow;Throat Clearing - Immediate (given cup sips)    Nectar Thick Nectar Thick Liquid: Not tested   Honey Thick Honey Thick Liquid: Not tested   Puree Puree: Impaired Presentation: Spoon Oral Phase Impairments: Impaired mastication Oral Phase Functional Implications: Prolonged oral transit Pharyngeal Phase Impairments: Suspected delayed Swallow;Multiple swallows   Solid   GO   Solid: Not tested        Dimas AguasMelissa Ilija Maxim, MA, CCC-SLP Acute Rehab SLP [phone] Fleet ContrasMelissa N Tessi Eustache 02/01/2017,8:07 AM

## 2017-02-01 NOTE — Progress Notes (Signed)
  Echocardiogram 2D Echocardiogram has been performed.  Delcie RochENNINGTON, Meggie Laseter 02/01/2017, 2:51 PM

## 2017-02-01 NOTE — Progress Notes (Signed)
Rehab Admissions Coordinator Note:  Patient was screened by Trish MageLogue, Gearldine Looney M for appropriateness for an Inpatient Acute Rehab Consult.  At this time, we are recommending Inpatient Rehab consult.  Trish MageLogue, Eleshia Wooley M 02/01/2017, 4:40 PM  I can be reached at [phone].

## 2017-02-01 NOTE — Progress Notes (Signed)
[place] TEAM 1 - Stepdown/ICU TEAM  Ishmael HolterHorace C Willmon  JWJ:[id number] DOB: [birth year] DOA: 01/31/2017 PCP: Fleet ContrasAvbuere, Edwin, MD    Brief Narrative:  76 y.o. male w/ a hx of HTN and stroke w/ residual R arm weakness who presented to the ED with worsening right-sided weakness and slurred speech of at least 24hrs duration. EMS brought patient to the ED where a CT of the head revealed a new stroke.   Subjective: Resting comfortably.  Denies cp, sob, n/v, or abdom pain.  Dysarthria makes detailed hx difficult.    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 rate 11, height 5\' 7"  (1.702 m), weight 68.3 kg (150 lb 9.2 oz), SpO2 97 %.  Intake/Output Summary (Last 24 hours) at 02/01/17 1455 Last data filed at 02/01/17 1300  Gross per 24 hour  Intake          1224.95 ml  Output              625 ml  Net           599.95 ml   Filed Weights   01/31/17 0924 01/31/17 1312 02/01/17 0500  Weight: 79.8 kg (176 lb) 68.9 kg (151 lb 12.8 oz) 68.3 kg (150 lb 9.2 oz)    Examination: General: No acute respiratory distress Lungs: Clear to auscultation  bilaterally without wheezes or crackles Cardiovascular: Regular rate and rhythm without murmur gallop or rub normal S1 and S2 Abdomen: Nontender, nondistended, soft, bowel sounds positive, no rebound, no ascites, no appreciable mass Extremities: No significant cyanosis, clubbing, or edema bilateral lower extremities  CBC:  Recent Labs Lab 01/31/17 0940 01/31/17 0949 02/01/17 0524  WBC 6.1  --  6.2  NEUTROABS 4.0  --   --   HGB 15.0 15.6 15.9  HCT 46.1 46.0 46.9  MCV 92.0  --  91.1  PLT 233  --  235   Basic Metabolic Panel:  Recent Labs Lab 01/31/17 0940 01/31/17 0949 01/31/17 1627 02/01/17 0524  NA 142 143  --  142  K 3.9 4.3  --  3.2*  CL 110 108  --  106  CO2 23  --   --  26  GLUCOSE 90 88  --  87  BUN 21* 30*  --  17  CREATININE 0.98 0.80  --  0.69  CALCIUM 8.7*  --   --  8.5*  MG 2.2  --  2.4 2.1  PHOS 3.3  --  3.5 3.4   GFR: Estimated Creatinine Clearance: 73.4 mL/min (by C-G formula  based on SCr of 0.69 mg/dL).  Liver Function Tests:  Recent Labs Lab 01/31/17 0940  AST 34  ALT 14*  ALKPHOS 44  BILITOT 1.9*  PROT 6.5  ALBUMIN 3.9    Coagulation Profile:  Recent Labs Lab 01/31/17 0940  INR 1.09    HbA1C: Hgb A1c MFr Bld  Date/Time Value Ref Range Status  10/27/2008 12:16 AM  4.6 - 6.1 % Final   5.4 (NOTE) The ADA recommends the following therapeutic goal for glycemic control related to Hgb A1c measurement: Goal of therapy: <6.5 Hgb A1c  Reference: American Diabetes Association: Clinical Practice Recommendations 2010, Diabetes Care, 2010, 33: (Suppl  1).    CBG:  Recent Labs Lab 01/31/17 2333 02/01/17 0001 02/01/17 0310 02/01/17 0727 02/01/17 1208  GLUCAP 54* 142* 91 81 102*    Recent Results (from the past 240 hour(s))  MRSA PCR Screening     Status: None   Collection Time: 01/31/17  5:23 PM  Result Value Ref Range Status   MRSA by PCR NEGATIVE NEGATIVE Final    Comment:        The GeneXpert MRSA Assay (FDA approved for  NASAL specimens only), is one component of a comprehensive MRSA colonization surveillance program. It is not intended to diagnose MRSA infection nor to guide or monitor treatment for MRSA infections.      Scheduled Meds: .  stroke: mapping our early stages of recovery book   Does not apply Once  . aspirin  300 mg Rectal Once  . chlorhexidine  15 mL Mouth Rinse BID  . mouth rinse  15 mL Mouth Rinse q12n4p     LOS: 1 day   Lonia BloodJeffrey T. Sueko Dimichele, MD [facility] Office  [phone] Pager - Text Page per Amion as per below:  On-Call/Text Page:      [email or web address]      password [email or web address]  If 7PM-7AM, please contact night-coverage [email or web address] Password [email or web address] 02/01/2017, 2:55 PM

## 2017-02-01 NOTE — Progress Notes (Signed)
PT Cancellation Note  Patient Details Name: Brandon HolterHorace C Krajewski MRN: [id number] DOB: 1941-05-13   Cancelled Treatment:    Reason Eval/Treat Not Completed: Patient at procedure or test/unavailable   Echo initiating testing. Will attempt to see later today   Zena AmosLynn P Barbara Ahart, PT 02/01/2017, 2:19 PM

## 2017-02-01 NOTE — Evaluation (Signed)
Physical Therapy Evaluation Patient Details Name: Brandon Howard MRN: [id number] DOB: Mar 17, 1941 Today's Date: 02/01/2017   History of Present Illness  75 y.o.malepast medical history significant for hypertension and stroke. Presents emergency room with right-sided weakness and slurred speech. 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 Pt currently with functional limitations due to the deficits listed below (see PT Problem List). Patient with expressive difficulties and no family present to discuss patient's prior functional status or support on discharge. Pt will benefit from skilled PT to increase their independence and safety with mobility to allow discharge to the venue listed below.       Follow Up Recommendations CIR    Equipment Recommendations  Other (comment) (TBA if no CIR)    Recommendations for Other Services Rehab consult     Precautions / Restrictions Precautions Precautions: Fall      Mobility  Bed Mobility Overal bed mobility: Needs Assistance Bed Mobility: Supine to Sit;Sit to Supine     Supine to sit: Mod assist;+2 for safety/equipment Sit to supine: Min assist   General bed mobility comments: difficulty scooting to EOB (in sit or supine)  Transfers Overall transfer level: Needs assistance Equipment used: Rolling walker (2 wheeled) Transfers: Sit to/from Stand Sit to Stand: +2 safety/equipment;Mod assist         General transfer comment: vc for sequencing; assist for balance and RLE support at knee  Ambulation/Gait Ambulation/Gait assistance: Mod assist;+2 physical assistance Ambulation Distance (Feet): 2 Feet Assistive device: Rolling walker (2 wheeled) Gait Pattern/deviations: Step-to  pattern;Decreased step length - right;Decreased dorsiflexion - right;Steppage;Ataxic;Narrow base of support (hyperextends Rt knee in stance)     General Gait Details: 1 step fwd/backward; side step x 3  Stairs            Wheelchair Mobility    Modified Rankin (Stroke Patients Only) Modified Rankin (Stroke Patients Only) Pre-Morbid Rankin Score: No symptoms Modified Rankin: Moderately severe disability     Balance Overall balance assessment: Needs assistance Sitting-balance support: Single extremity supported;Feet unsupported Sitting balance-Leahy Scale: Poor     Standing balance support: No upper extremity supported Standing balance-Leahy Scale: Poor Standing balance comment: posterior lean                             Pertinent Vitals/Pain Pain Assessment: Faces Faces Pain Scale: Hurts little more Pain Location: rt shoulder (pt points) Pain Descriptors / Indicators: Guarding Pain Intervention(s): Limited activity within patient's tolerance;Repositioned (noted ? subluxation)    Home Living Family/patient expects to be discharged to:: Unsure Living Arrangements: Alone               Additional Comments: pt difficult to understand, therefore full history not obtained    Prior Function                 Hand Dominance   Dominant Hand: Right    Extremity/Trunk Assessment   Upper Extremity Assessment Upper Extremity Assessment: RUE deficits/detail;Defer to OT evaluation RUE Deficits / Details: +weak grip, able to hold handle of RW    Lower Extremity Assessment Lower Extremity Assessment: RLE deficits/detail RLE Deficits / Details: hip flexion 3/5, abduction 3+, knee extension 3-/5; ankle DF 2+/5  Communication   Communication: Expressive difficulties  Cognition Arousal/Alertness: Awake/alert Behavior During Therapy: Flat affect Overall Cognitive Status: Difficult to assess                                         General Comments      Exercises     Assessment/Plan    PT Assessment Patient needs continued PT services  PT Problem List Decreased strength;Decreased activity tolerance;Decreased balance;Decreased mobility;Decreased coordination;Decreased knowledge of use of DME;Pain       PT Treatment Interventions DME instruction;Gait training;Stair training;Functional mobility training;Therapeutic activities;Balance training;Neuromuscular re-education;Patient/family education    PT Goals (Current goals can be found in the Care Plan section)  Acute Rehab PT Goals Patient Stated Goal: pt unable to state; agrees to PT goals PT Goal Formulation: With patient Time For Goal Achievement: 02/15/17 Potential to Achieve Goals: Good    Frequency Min 4X/week   Barriers to discharge        Co-evaluation               AM-PAC PT "6 Clicks" Daily Activity  Outcome Measure Difficulty turning over in bed (including adjusting bedclothes, sheets and blankets)?: A Lot Difficulty moving from lying on back to sitting on the side of the bed? : A Lot Difficulty sitting down on and standing up from a chair with arms (e.g., wheelchair, bedside commode, etc,.)?: A Lot Help needed moving to and from a bed to chair (including a wheelchair)?: A Lot Help needed walking in hospital room?: Total Help needed climbing 3-5 steps with a railing? : Total 6 Click Score: 10    End of Session Equipment Utilized During Treatment: Gait belt Activity Tolerance: Patient tolerated treatment well Patient left: in bed;with call bell/phone within reach;with bed alarm set Nurse Communication: Mobility status;Other (comment) (RUE positioning) PT Visit Diagnosis: Ataxic gait (R26.0);Hemiplegia and hemiparesis Hemiplegia - Right/Left: Right Hemiplegia - dominant/non-dominant: Dominant Hemiplegia - caused by: Cerebral infarction    Time: [id number] PT Time Calculation (min) (ACUTE ONLY): 17 min   Charges:   PT  Evaluation $PT Eval Moderate Complexity: 1 Mod     PT G Codes:          [employer] Sciences CorporationLynn P Cortez Steelman, PT 02/01/2017, 3:30 PM

## 2017-02-01 NOTE — Progress Notes (Signed)
Hypoglycemic Event  CBG: 54  Treatment: 25 mL D50 IV  Symptoms: none  Follow-up CBG: Time: 0001 CBG Result: 142  Possible Reasons for Event: Pt NPO  Comments/MD notified: Dr. Selena BattenKim notified of CBG. Orders received.     Audie BoxKara L Slusher

## 2017-02-01 NOTE — Progress Notes (Signed)
Modified Barium Swallow Progress Note  Patient Details  Name: Brandon Howard MRN: [id number] Date of Birth: 1940-08-25  Today's Date: 02/01/2017  Modified Barium Swallow completed.  Full report located under Chart Review in the Imaging Section.  Brief recommendations include the following:  Clinical Impression  MBS was completed using thin liquids, nectar thick liquids, pureed material and dual textured solids.  The patient presented with oropharyngeal dysphagia.  The oral phase was characterized by adequate but delayed mastication of dual textured solids, oral residue given pureed material and dual textured solids and premature loss of the bolus seen across most textures.  In addition, decreased labial seal leads to anterior escape mostly given thin liquids.  The pharyngeal phase of the swallow was characterized by a swallow trigger in the pyriform sinuses given thin and nectar thick liquids, a swallow trigger in the vallecula given pureed material and dual textured solids, mildly decreased laryngeal vestibule closure and mild pyriform residue seen after the swallow which appeared to be secondary to premature closure of the UES.  Flash penetration was noted during the swallow given thin liquids via spoon sips.  Penetration into the laryngeal vestibule was seen during the swallowing given cup sips of thin liquids which the patient was unable to clear with a cued second swallow nor a cough and reswallow.  One episode of flash penetration was seen given spoon sips of nectar thick liquids during the swallow but was not replicated even give self fed cup sips.  Esophageal sweep did not reveal overt issues.  Recommend a dysphagia 3 diet with nectar thick liquids.  The patient should take 1 sip at a time and not use straws.  Given the oral residue and oral weakness there is potential concern for oral residue given solids.  Instructed nursing to downgrade his diet to dysphagia 1 if issues are noted with  pocketing/oral residue.  ST will follow during acute stay.  The patient would also benefit from ST follow up at the next level of care.     Swallow Evaluation Recommendations       SLP Diet Recommendations: Dysphagia 3 (Mech soft) solids;Nectar thick liquid   Liquid Administration via: Cup   Medication Administration: Crushed with puree   Supervision: Staff to assist with self feeding   Compensations: Slow rate;Small sips/bites   Postural Changes: Remain semi-upright after after feeds/meals (Comment);Seated upright at 90 degrees   Oral Care Recommendations: Oral care before and after PO;Oral care BID   Other Recommendations: Order thickener from pharmacy;Prohibited food (jello, ice cream, thin soups)    Brandon AguasMelissa Jamin Humphries, MA, CCC-SLP Acute Rehab SLP [phone]  Fleet ContrasMelissa N Roderic Lammert 02/01/2017,11:25 AM

## 2017-02-01 NOTE — Progress Notes (Signed)
ANTICOAGULATION CONSULT NOTE  Pharmacy Consult for heparin Indication: stroke  No Known Allergies  Patient Measurements: Height: 5\' 7"  (170.2 cm) Weight: 150 lb 9.2 oz (68.3 kg) IBW/kg (Calculated) : 66.1 Heparin Dosing Weight: 68.9kg  Vital Signs: Temp: 97.8 F (36.6 C) (08/05 0800) Temp Source: Oral (08/05 0800) BP: 155/78 (08/05 1000) Pulse Rate: 73 (08/05 1000)  Labs:  Recent Labs  01/31/17 0940 01/31/17 0949 02/01/17 0202 02/01/17 0524 02/01/17 1003  HGB 15.0 15.6  --  15.9  --   HCT 46.1 46.0  --  46.9  --   PLT 233  --   --  235  --   APTT 27  --   --   --   --   LABPROT 14.1  --   --   --   --   INR 1.09  --   --   --   --   HEPARINUNFRC  --   --  0.54  --  0.41  CREATININE 0.98 0.80  --  0.69  --     Estimated Creatinine Clearance: 73.4 mL/min (by C-G formula based on SCr of 0.69 mg/dL).   Medical History: Past Medical History:  Diagnosis Date  . Hypertension   . Stroke [facility](HCC)     Medications:  Infusions:  . dextrose 5 % and 0.9% NaCl Stopped (02/01/17 0942)  . heparin 700 Units/hr (02/01/17 0251)  . potassium chloride Stopped (02/01/17 1042)    Assessment: 75 yom presented to the hospital with right sided weakness found to have an acute stroke. Started on IV heparin per neurology. CBC is WNL and he is not on anticoagulation PTA. To stop if patient has worsening mental status/severe headache. Heparin level is therapeutic at 0.41. No bleeding noted.   Goal of Therapy:  Heparin level 0.3-0.5 units/ml Monitor platelets by anticoagulation protocol: Yes   Plan:  Continue Heparin gtt 700 units/hr - NO BOLUS Daily heparin level and CBC F/u LOT  Reino Lybbert, Drake Leachachel Lynn 02/01/2017,11:01 AM

## 2017-02-02 ENCOUNTER — Encounter (HOSPITAL_COMMUNITY): Payer: Self-pay | Admitting: Physical Medicine and Rehabilitation

## 2017-02-02 DIAGNOSIS — F1410 Cocaine abuse, uncomplicated: Secondary | ICD-10-CM

## 2017-02-02 DIAGNOSIS — I6930 Unspecified sequelae of cerebral infarction: Secondary | ICD-10-CM

## 2017-02-02 DIAGNOSIS — I639 Cerebral infarction, unspecified: Secondary | ICD-10-CM

## 2017-02-02 DIAGNOSIS — I10 Essential (primary) hypertension: Secondary | ICD-10-CM

## 2017-02-02 DIAGNOSIS — I69322 Dysarthria following cerebral infarction: Secondary | ICD-10-CM

## 2017-02-02 DIAGNOSIS — R739 Hyperglycemia, unspecified: Secondary | ICD-10-CM

## 2017-02-02 DIAGNOSIS — I69391 Dysphagia following cerebral infarction: Secondary | ICD-10-CM

## 2017-02-02 LAB — CBC
HCT: 44.6 % (ref 39.0–52.0)
HEMOGLOBIN: 15 g/dL (ref 13.0–17.0)
MCH: 30.4 pg (ref 26.0–34.0)
MCHC: 33.6 g/dL (ref 30.0–36.0)
MCV: 90.3 fL (ref 78.0–100.0)
PLATELETS: 231 10*3/uL (ref 150–400)
RBC: 4.94 MIL/uL (ref 4.22–5.81)
RDW: 13.5 % (ref 11.5–15.5)
WBC: 6.9 10*3/uL (ref 4.0–10.5)

## 2017-02-02 LAB — CALCIUM, IONIZED: Calcium, Ionized, Serum: 4.5 mg/dL (ref 4.5–5.6)

## 2017-02-02 LAB — BASIC METABOLIC PANEL
ANION GAP: 7 (ref 5–15)
BUN: 12 mg/dL (ref 6–20)
CO2: 24 mmol/L (ref 22–32)
Calcium: 8.4 mg/dL — ABNORMAL LOW (ref 8.9–10.3)
Chloride: 109 mmol/L (ref 101–111)
Creatinine, Ser: 0.77 mg/dL (ref 0.61–1.24)
Glucose, Bld: 118 mg/dL — ABNORMAL HIGH (ref 65–99)
POTASSIUM: 3.5 mmol/L (ref 3.5–5.1)
SODIUM: 140 mmol/L (ref 135–145)

## 2017-02-02 LAB — GLUCOSE, CAPILLARY
GLUCOSE-CAPILLARY: 98 mg/dL (ref 65–99)
Glucose-Capillary: 105 mg/dL — ABNORMAL HIGH (ref 65–99)
Glucose-Capillary: 134 mg/dL — ABNORMAL HIGH (ref 65–99)
Glucose-Capillary: 133 mg/dL — ABNORMAL HIGH (ref 65–99)

## 2017-02-02 LAB — HEMOGLOBIN A1C
Hgb A1c MFr Bld: 5.3 % (ref 4.8–5.6)
MEAN PLASMA GLUCOSE: 105 mg/dL

## 2017-02-02 LAB — HEPARIN LEVEL (UNFRACTIONATED)
HEPARIN UNFRACTIONATED: 0.17 [IU]/mL — AB (ref 0.30–0.70)
HEPARIN UNFRACTIONATED: 0.29 [IU]/mL — AB (ref 0.30–0.70)

## 2017-02-02 MED ORDER — CLOPIDOGREL BISULFATE 75 MG PO TABS
75.0000 mg | ORAL_TABLET | Freq: Every day | ORAL | Status: DC
  Administered 2017-02-02 – 2017-02-04 (×3): 75 mg via ORAL
  Filled 2017-02-02 (×3): qty 1

## 2017-02-02 MED ORDER — ASPIRIN 81 MG PO CHEW
81.0000 mg | CHEWABLE_TABLET | Freq: Every day | ORAL | Status: DC
  Administered 2017-02-02 – 2017-02-04 (×3): 81 mg via ORAL
  Filled 2017-02-02 (×3): qty 1

## 2017-02-02 MED ORDER — HEPARIN SODIUM (PORCINE) 5000 UNIT/ML IJ SOLN
5000.0000 [IU] | Freq: Three times a day (TID) | INTRAMUSCULAR | Status: DC
Start: 2017-02-02 — End: 2017-02-04
  Administered 2017-02-02 – 2017-02-04 (×6): 5000 [IU] via SUBCUTANEOUS
  Filled 2017-02-02 (×6): qty 1

## 2017-02-02 MED ORDER — POTASSIUM CHLORIDE CRYS ER 20 MEQ PO TBCR
40.0000 meq | EXTENDED_RELEASE_TABLET | Freq: Once | ORAL | Status: AC
  Administered 2017-02-02: 40 meq via ORAL
  Filled 2017-02-02: qty 2

## 2017-02-02 NOTE — Evaluation (Signed)
Occupational Therapy Evaluation Patient Details Name: Brandon Howard MRN: [id number] DOB: 06-30-1941 Today's Date: 02/02/2017    History of Present Illness 76 y.o.malepast medical history significant for hypertension and stroke. Presents emergency room with right-sided weakness and slurred speech. 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 He does not drive. Pt presents with R side weakness and shoulder pain with subluxation. R UE is weaker proximal vs distal. Balance is poor. Difficult to assess pt's cognition due to slurred speech, but upon arrival, pt had unplugged his telemetry and was attempting to get up with foot of the recliner elevated, indicating poor safety awareness. Pt requires min to max assist with ADL. He will benefit from post acute rehab prior to return home. Will follow acutely.    Follow Up Recommendations  CIR    Equipment Recommendations  Other (comment) (defer to next venue)    Recommendations for Other Services       Precautions / Restrictions Precautions Precautions: Fall Restrictions Weight Bearing Restrictions: No      Mobility Bed Mobility               General bed mobility comments: pt in chair  Transfers Overall transfer level: Needs assistance Equipment used: Rolling walker (2 wheeled) Transfers: Sit to/from [employer] CorporationStand;Stand Pivot Transfers Sit to Stand: Mod assist Stand pivot transfers: Mod assist       General transfer comment: cues for hand placement, assist to rise and for balance    Balance Overall balance assessment: Needs assistance   Sitting balance-Leahy Scale: Poor     Standing balance support: Bilateral upper extremity  supported Standing balance-Leahy Scale: Poor Standing balance comment: posterior lean                           ADL either performed or assessed with clinical judgement   ADL Overall ADL's : Needs assistance/impaired Eating/Feeding: Set up;Sitting   Grooming: Wash/dry hands;Wash/dry face;Sitting;Minimal assistance;Oral care   Upper Body Bathing: Moderate assistance   Lower Body Bathing: Sit to/from stand;Maximal assistance   Upper Body Dressing : Moderate assistance;Sitting   Lower Body Dressing: Maximal assistance;Sit to/from stand   Toilet Transfer: Moderate assistance;Stand-pivot;BSC   Toileting- Clothing Manipulation and Hygiene: Maximal assistance;Sit to/from stand              Vision Baseline Vision/History: No visual deficits Patient Visual Report: No change from baseline       Perception     Praxis      Pertinent Vitals/Pain Pain Assessment: Faces Faces Pain Scale: Hurts even more Pain Location: rt shoulder (pt points) Pain Descriptors / Indicators: Grimacing;Guarding Pain Intervention(s): Monitored during session;Repositioned     Hand Dominance Right   Extremity/Trunk Assessment Upper Extremity Assessment Upper Extremity Assessment: RUE deficits/detail RUE Deficits / Details: 4-/5 gross grasp, 3+/5 elbow, 2/5 shoulder, pain with shoulder ROM above 90 degrees, + subluxation RUE Coordination: decreased fine motor;decreased gross motor   Lower Extremity Assessment Lower Extremity Assessment: Defer to PT evaluation       Communication Communication Communication: Expressive difficulties   Cognition Arousal/Alertness: Awake/alert Behavior During Therapy: Flat affect Overall Cognitive Status: Impaired/Different from baseline Area of Impairment: Safety/judgement  Safety/Judgement: Decreased awareness of safety         General Comments       Exercises     Shoulder Instructions      Home  Living Family/patient expects to be discharged to:: Unsure Living Arrangements: Alone                                  Lives With: Alone    Prior Functioning/Environment Level of Independence: Independent with assistive device(s)        Comments: walks with a cane, does not drive, independent in ADL and IADL        OT Problem List: Decreased strength;Decreased activity tolerance;Decreased range of motion;Impaired balance (sitting and/or standing);Decreased coordination;Decreased safety awareness;Decreased knowledge of use of DME or AE;Impaired UE functional use;Pain      OT Treatment/Interventions: Self-care/ADL training;Neuromuscular education;DME and/or AE instruction;Therapeutic activities;Patient/family education;Balance training    OT Goals(Current goals can be found in the care plan section) Acute Rehab OT Goals Patient Stated Goal: to return home with his son's care OT Goal Formulation: With patient Time For Goal Achievement: 02/16/17 Potential to Achieve Goals: Good ADL Goals Pt Will Perform Grooming: with set-up;sitting (at sink) Pt Will Perform Upper Body Dressing: with min assist;sitting Pt Will Perform Lower Body Dressing: with min assist;sit to/from stand Pt Will Transfer to Toilet: with min assist;ambulating;bedside commode (over toilet) Pt Will Perform Toileting - Clothing Manipulation and hygiene: with min assist;sit to/from stand Pt/caregiver will Perform Home Exercise Program: Right Upper extremity;With Supervision Barbaraann Boys) Additional ADL Goal #1: Pt will protect R shoulder from injury during mobility and ADL and position appropriately in chair and bed.  OT Frequency: Min 3X/week   Barriers to D/C:            Co-evaluation              AM-PAC PT "6 Clicks" Daily Activity     Outcome Measure Help from another person eating meals?: A Little Help from another person taking care of personal grooming?: A Little Help from another person  toileting, which includes using toliet, bedpan, or urinal?: A Lot Help from another person bathing (including washing, rinsing, drying)?: A Lot Help from another person to put on and taking off regular upper body clothing?: A Lot Help from another person to put on and taking off regular lower body clothing?: A Lot 6 Click Score: 14   End of Session Equipment Utilized During Treatment: Rolling walker;Gait belt  Activity Tolerance: Patient tolerated treatment well Patient left: in chair;with call bell/phone within reach;with chair alarm set  OT Visit Diagnosis: Unsteadiness on feet (R26.81);Other abnormalities of gait and mobility (R26.89);Muscle weakness (generalized) (M62.81);Hemiplegia and hemiparesis Hemiplegia - Right/Left: Right Hemiplegia - dominant/non-dominant: Dominant Hemiplegia - caused by: Cerebral infarction                Time: 1530-1546 OT Time Calculation (min): 16 min Charges:  OT General Charges $OT Visit: 1 Procedure OT Evaluation $OT Eval Moderate Complexity: 1 Procedure G-Codes:     Evern Bio 02/02/2017, 4:29 PM  [phone]

## 2017-02-02 NOTE — Progress Notes (Signed)
ANTICOAGULATION CONSULT NOTE Pharmacy Consult for heparin Indication: stroke  No Known Allergies  Patient Measurements: Height: 5\' 7"  (170.2 cm) Weight: 150 lb 9.2 oz (68.3 kg) IBW/kg (Calculated) : 66.1 Heparin Dosing Weight: 68.9kg  Vital Signs: Temp: 97.9 F (36.6 C) (08/05 2341) Temp Source: Oral (08/05 2341) BP: 144/82 (08/06 0300) Pulse Rate: 70 (08/06 0300)  Labs:  Recent Labs  01/31/17 0940 01/31/17 0949 02/01/17 0202 02/01/17 0524 02/01/17 1003 02/02/17 0243  HGB 15.0 15.6  --  15.9  --  15.0  HCT 46.1 46.0  --  46.9  --  44.6  PLT 233  --   --  235  --  231  APTT 27  --   --   --   --   --   LABPROT 14.1  --   --   --   --   --   INR 1.09  --   --   --   --   --   HEPARINUNFRC  --   --  0.54  --  0.41 0.17*  CREATININE 0.98 0.80  --  0.69  --  0.77    Estimated Creatinine Clearance: 73.4 mL/min (by C-G formula based on SCr of 0.77 mg/dL).  Assessment: 76 y.o. male with CVA for heparin   Goal of Therapy:  Heparin level 0.3-0.5 units/ml Monitor platelets by anticoagulation protocol: Yes   Plan:  Increase Heparin 850 units/hr Check heparin level in 8 hours.   Helen Winterhalter, Gary FleetGregory Vernon 02/02/2017,3:40 AM

## 2017-02-02 NOTE — Progress Notes (Signed)
[facility] TEAM 1 - Stepdown/ICU TEAM  Brandon Howard  ZOX:[id number] DOB: 1941-04-20 DOA: 01/31/2017 PCP: Brandon Contras, MD    Brief Narrative:  76 y.o. male w/ a hx of HTN and stroke w/ residual R arm weakness who presented to the ED with worsening right-sided weakness and slurred speech of at least 24hrs duration. EMS brought patient to the ED where a CT of the head revealed a new stroke.   Subjective: Sitting up in a bedside chair.  Denies discomfort/uncontrolled pain.  No resp distress.  Remains markedly dysarthric.    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  Severe right UE weakness and severe dysarthria - changing to oral ASA + Plavix as per Stroke Team today - stable to transfer to Neuro floor   HTN   Avoid overcorrection in setting of acute CVA - follow w/o change in medical tx today   HLD Resumed home medical tx - LDL controlled   Cocaine use  UDS + at time of admit - pt admits to frequent use - counseled on absolute need to stop - ask CSW to see to reinforce this issue   Hypokalemia  Correct and follow w/ goal of 4.0  CAD s/p CABG 2005 Denies chest pain  Possible EtOH abuse  Pt denies daily use but unclear if this is reliable - watch for withdrawal, but no sign of this so far  DVT prophylaxis: lovenox   Code Status: FULL CODE Family Communication: no family present at time of exam  Disposition Plan: transfer to neuro bed - possible CIR admit   Consultants:  Stroke Team - Neurology   Procedures: TTE  Antimicrobials:  none   Objective: Howard pressure 115/89, pulse 85, temperature 97.9 F (36.6 C), temperature source Oral, resp. rate 17, height 5\' 7"  (1.702 m), weight 70.7 kg (155 lb 13.8 oz), SpO2 97 %.  Intake/Output Summary (Last 24 hours) at 02/02/17 1446 Last data filed at 02/02/17 1400  Gross per 24 hour  Intake          1339.05 ml  Output             1725 ml    Net          -385.95 ml   Filed Weights   01/31/17 1312 02/01/17 0500 02/02/17 0500  Weight: 68.9 kg (151 lb 12.8 oz) 68.3 kg (150 lb 9.2 oz) 70.7 kg (155 lb 13.8 oz)    Examination: General: No acute respiratory distress - alert  Lungs: CTA B w/o wheezing or focal crackles  Cardiovascular: RRR w/o gallup or rub  Abdomen: NT/ND, soft, bs+, no rebound  Extremities: No C/C/E B LE  CBC:  Recent Labs Lab 01/31/17 0940 01/31/17 0949 02/01/17 0524 02/02/17 0243  WBC 6.1  --  6.2 6.9  NEUTROABS 4.0  --   --   --   HGB 15.0 15.6 15.9 15.0  HCT 46.1 46.0 46.9 44.6  MCV 92.0  --  91.1 90.3  PLT 233  --  235 231   Basic Metabolic Panel:  Recent Labs Lab 01/31/17 0940 01/31/17 0949 01/31/17 1627 02/01/17 0524 02/02/17 0243  NA 142 143  --  142 140  K 3.9 4.3  --  3.2* 3.5  CL 110 108  --  106 109  CO2 23  --   --  26 24  GLUCOSE 90 88  --  87 118*  BUN  21* 30*  --  17 12  CREATININE 0.98 0.80  --  0.69 0.77  CALCIUM 8.7*  --   --  8.5* 8.4*  MG 2.2  --  2.4 2.1  --   PHOS 3.3  --  3.5 3.4  --    GFR: Estimated Creatinine Clearance: 73.4 mL/min (by C-G formula based on SCr of 0.77 mg/dL).  Liver Function Tests:  Recent Labs Lab 01/31/17 0940  AST 34  ALT 14*  ALKPHOS 44  BILITOT 1.9*  PROT 6.5  ALBUMIN 3.9    Coagulation Profile:  Recent Labs Lab 01/31/17 0940  INR 1.09    HbA1C: Hgb A1c MFr Bld  Date/Time Value Ref Range Status  02/01/2017 02:02 AM 5.3 4.8 - 5.6 % Final    Comment:    (NOTE)         Pre-diabetes: 5.7 - 6.4         Diabetes: >6.4         Glycemic control for adults with diabetes: <7.0   10/27/2008 12:16 AM  4.6 - 6.1 % Final   5.4 (NOTE) The ADA recommends the following therapeutic goal for glycemic control related to Hgb A1c measurement: Goal of therapy: <6.5 Hgb A1c  Reference: American Diabetes Association: Clinical Practice Recommendations 2010, Diabetes Care, 2010, 33: (Suppl  1).    CBG:  Recent Labs Lab  02/01/17 1916 02/01/17 2310 02/02/17 0312 02/02/17 0742 02/02/17 1138  GLUCAP 179* 238* 105* 98 134*    Recent Results (from the past 240 hour(s))  MRSA PCR Screening     Status: None   Collection Time: 01/31/17  5:23 PM  Result Value Ref Range Status   MRSA by PCR NEGATIVE NEGATIVE Final    Comment:        The GeneXpert MRSA Assay (FDA approved for NASAL specimens only), is one component of a comprehensive MRSA colonization surveillance program. It is not intended to diagnose MRSA infection nor to guide or monitor treatment for MRSA infections.      Scheduled Meds: .  stroke: mapping our early stages of recovery book   Does not apply Once  . allopurinol  100 mg Oral Daily  . aspirin  81 mg Oral Daily  . clopidogrel  75 mg Oral Daily  . mouth rinse  15 mL Mouth Rinse BID  . simvastatin  40 mg Oral QPM     LOS: 2 days   Brandon BloodJeffrey T. Lagina Reader, MD [facility] Office  [phone] Pager - Text Page per Amion as per below:  On-Call/Text Page:      Brandon Howard      password [email or web address]  If 7PM-7AM, please contact night-coverage [email or web address] Password [email or web address] 02/02/2017, 2:46 PM

## 2017-02-02 NOTE — Consult Note (Signed)
Physical Medicine and Rehabilitation Consult  Reason for Consult: Right sided weakness and difficulty speaking Referring Physician: Neuro MD    HPI: Brandon Howard is a 76 y.o. 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 History taken from chart review. CT head with concerns of left hemipons acute/subacute infarct and encephalomalacia right cerebellar hemisphere. 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  MRI reviewed, showing bilateral infarcts. Per report and MRA brain, acute infarcts in left > right pons, bilateral cerebellum and right thalamus, critical proximal BS stenosis with question thrombus or irregular plaque. 2D echo with EF 55- 60% and no wall abnormality. 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 IV heparin X one week followed by repeat CTA to monitor for resolution of vasospasms. Moderate to severe dysarthria noted with dysphagia and patient placed on dysphagia 3, nectar liquids. PT evaluation done revealing deficits in mobility with RLE weakness and CIR recommended for follow up therapy.   Review of Systems  Eyes:       Indicating visual changes--question double vision (closing left eye)  Respiratory: Negative for shortness of breath.   Cardiovascular: Negative for chest pain.  Gastrointestinal: Negative for abdominal pain.  Neurological: Positive for speech change, focal weakness and weakness. Negative for sensory change.  All other systems reviewed and are negative.     Past Medical History:  Diagnosis Date  . Gout   . Hepatitis   . Hypertension   . Pancreatitis   . Right knee pain   .  Stroke (HCC)   . Vitamin B 12 deficiency     Past Surgical History:  Procedure Laterality Date  . CHOLECYSTECTOMY    . CORONARY ARTERY BYPASS GRAFT  2005     Family History  Problem Relation Age of Onset  . Throat cancer Mother   . COPD Father      Social History:  Lives alone in at [facility]. He indicated that he was independent PTA and has assistance occasionally with housework.  He  reports that he has never smoked. He used to chew tobacco. He reports that he drinks alcohol occasionally. He uses cocaine occasionally.     Allergies: No Known Allergies    Medications Prior to Admission  Medication Sig Dispense Refill  . allopurinol (ZYLOPRIM) 100 MG tablet Take 100 mg by mouth daily.  0  . amLODipine (NORVASC) 10 MG tablet Take 10 mg by mouth daily.  0  . clopidogrel (PLAVIX) 75 MG tablet Take 75 mg by mouth daily.  0  . lisinopril (PRINIVIL,ZESTRIL) 20 MG tablet Take 20 mg by mouth daily.  0  . simvastatin (ZOCOR) 40 MG tablet Take 40 mg by mouth every evening.  0    Home: Home Living Family/patient expects to be discharged to:: Unsure Living Arrangements: Alone Additional Comments: pt difficult to understand, therefore full history not obtained  Lives With: Alone  Functional History:   Functional Status:  Mobility: Bed Mobility Overal bed mobility: Needs Assistance Bed Mobility: Supine to Sit, Sit to Supine Supine to sit: Min assist Sit to supine: Min assist General bed mobility comments: Assist with bed pad to complete scooting to  EOB.  Transfers Overall transfer level: Needs assistance Equipment used: Rolling walker (2 wheeled) Transfers: Sit to/from Stand Sit to Stand: +2 safety/equipment, Mod assist General transfer comment: VC's for hand placement on seated surface for safety. Pt was able to power up to full stand with +2 assist for safety.  Ambulation/Gait Ambulation/Gait assistance: Mod assist, +2 physical assistance Ambulation Distance (Feet): 25  Feet Assistive device: Rolling walker (2 wheeled) Gait Pattern/deviations: Step-to pattern, Decreased step length - right, Decreased dorsiflexion - right, Steppage, Ataxic, Narrow base of support General Gait Details: VC's for improved posture, balance support, and weight shift. +2 for chair follow and line management.  Gait velocity: Decreased Gait velocity interpretation: Below normal speed for age/gender    ADL:    Cognition: Cognition Overall Cognitive Status: Difficult to assess Arousal/Alertness: Awake/alert Orientation Level: Oriented to person, Oriented to place, Oriented to time, Oriented to situation (missed year, knows month) Attention: Sustained Sustained Attention: Impaired Sustained Attention Impairment: Functional basic Memory: Impaired Memory Impairment: Decreased recall of new information (3/3 immediate recall  1/3 delayed recall) Awareness: Appears intact Problem Solving: Impaired Problem Solving Impairment: Verbal basic Safety/Judgment: Appears intact Cognition Arousal/Alertness: Awake/alert Behavior During Therapy: Flat affect Overall Cognitive Status: Difficult to assess Difficult to assess due to: Impaired communication   Blood pressure (!) 143/76, pulse 87, temperature 97.6 F (36.4 C), temperature source Oral, resp. rate 13, height 5\' 7"  (1.702 m), weight 70.7 kg (155 lb 13.8 oz), SpO2 98 %. Physical Exam  Nursing note and vitals reviewed. Constitutional: He is oriented to person, place, and time. He appears well-developed and well-nourished. No distress.  HENT:  Head: Normocephalic and atraumatic.  Eyes: Pupils are equal, round, and reactive to light. Conjunctivae and EOM are normal.  Neck: Normal range of motion. Neck supple.  Cardiovascular: Normal rate and regular rhythm.   Respiratory: Effort normal and breath sounds normal. No stridor.  GI: Soft. Bowel sounds are normal. He exhibits no distension.  Musculoskeletal: He exhibits no edema or  tenderness.  Healing abrasion right knee.   Neurological: He is alert and oriented to person, place, and time. A cranial nerve deficit is present.  Right facial droop with dysarthria.  Able to follow simple one and two step motor commands. Motor: RUE: 3/5 proximal to distal LLE/LUE: 5/5 proximal to distal RLE: 4/5 proximal to distal Sensation intact to light touch DTRs symmetric Right spastic hemiparesis.   Skin: Skin is warm and dry. He is not diaphoretic.  Psychiatric: He has a normal mood and affect. His behavior is normal. His speech is slurred.    Results for orders placed or performed during the hospital encounter of 01/31/17 (from the past 24 hour(s))  Glucose, capillary     Status: Abnormal   Collection Time: 02/01/17 12:08 PM  Result Value Ref Range   Glucose-Capillary 102 (H) 65 - 99 mg/dL  Glucose, capillary     Status: Abnormal   Collection Time: 02/01/17  7:16 PM  Result Value Ref Range   Glucose-Capillary 179 (H) 65 - 99 mg/dL  Glucose, capillary     Status: Abnormal   Collection Time: 02/01/17 11:10 PM  Result Value Ref Range   Glucose-Capillary 238 (H) 65 - 99 mg/dL  Heparin level (unfractionated)     Status: Abnormal   Collection Time: 02/02/17  2:43 AM  Result Value Ref Range   Heparin Unfractionated 0.17 (L) 0.30 - 0.70 IU/mL  CBC     Status: None   Collection Time: 02/02/17  2:43 AM  Result Value Ref Range   WBC 6.9 4.0 - 10.5 K/uL   RBC 4.94 4.22 - 5.81 MIL/uL   Hemoglobin 15.0 13.0 - 17.0 g/dL   HCT 16.1 09.6 - 04.5 %   MCV 90.3 78.0 - 100.0 fL   MCH 30.4 26.0 - 34.0 pg   MCHC 33.6 30.0 - 36.0 g/dL   RDW 40.9 81.1 - 91.4 %   Platelets 231 150 - 400 K/uL  Basic metabolic panel     Status: Abnormal   Collection Time: 02/02/17  2:43 AM  Result Value Ref Range   Sodium 140 135 - 145 mmol/L   Potassium 3.5 3.5 - 5.1 mmol/L   Chloride 109 101 - 111 mmol/L   CO2 24 22 - 32 mmol/L   Glucose, Bld 118 (H) 65 - 99 mg/dL   BUN 12 6 - 20 mg/dL    Creatinine, Ser 7.82 0.61 - 1.24 mg/dL   Calcium 8.4 (L) 8.9 - 10.3 mg/dL   GFR calc non Af Amer >60 >60 mL/min   GFR calc Af Amer >60 >60 mL/min   Anion gap 7 5 - 15  Glucose, capillary     Status: Abnormal   Collection Time: 02/02/17  3:12 AM  Result Value Ref Range   Glucose-Capillary 105 (H) 65 - 99 mg/dL  Glucose, capillary     Status: None   Collection Time: 02/02/17  7:42 AM  Result Value Ref Range   Glucose-Capillary 98 65 - 99 mg/dL   Comment 1 Notify RN    Comment 2 Document in Chart    Ct Angio Head W Or Wo Contrast  Result Date: 01/31/2017 CLINICAL DATA:  76 year old male with right side weakness and slurred speech for several days. Acute posterior circulation infarcts with very severe basilar artery stenosis on MRI and MRA today. EXAM: CT ANGIOGRAPHY HEAD AND NECK TECHNIQUE: Multidetector CT imaging of the head and neck was performed using the standard protocol during bolus administration of intravenous contrast. Multiplanar CT image reconstructions and MIPs were obtained to evaluate the vascular anatomy. Carotid stenosis measurements (when applicable) are obtained utilizing NASCET criteria, using the distal internal carotid diameter as the denominator. CONTRAST:  50 mL Isovue 370 COMPARISON:  Brain MRI, head and neck MRA 1206 hours today, and earlier FINDINGS: CTA NECK Skeleton: Prior sternotomy. Superior cervical spine degeneration including multilevel spondylolisthesis, and chronic C4-C5 ankylosis. Partially visible thoracic spine degeneration. No acute osseous abnormality identified. Visualized paranasal sinuses and mastoids are stable and well pneumatized. Upper chest: Partially visible cardiomegaly and prior CABG. No superior mediastinal lymphadenopathy. Dependent pulmonary atelectasis. Other neck: Negative.  No cervical lymphadenopathy. Aortic arch: Mild to moderate aortic arch soft and calcified plaque. Three vessel arch configuration. Right carotid system: No brachiocephalic or  right CCA origin stenosis. Soft plaque along the anterior wall of the mid right CCA without stenosis. Bulky calcified plaque at the right carotid bifurcation with soft and calcified plaque extending into the right ICA origin and bulb, but less than 50 % stenosis with respect to the distal vessel (series 9, image 74). Left carotid system: No left CCA origin stenosis despite soft and calcified plaque. Tortuous left CCA, with a kinked appearance at the anterior left thoracic inlet (series 5, image 139). Mild anterior wall soft and calcified plaque proximal to the left carotid bifurcation with no associated stenosis. Bulky calcified plaque at the left ICA origin and bulb, but stenosis is less than 50 % with respect to the distal vessel. Superimposed soft plaque  in the distal left ICA bulb beyond which there is tortuosity of the vessel with a mildly kinked appearance. Soft and calcified plaque of the distal left ICA just below the skullbase without stenosis. Vertebral arteries: No proximal right subclavian artery stenosis despite plaque. Calcified plaque at the right vertebral artery origin with mild to moderate stenosis (series 8, image 140). No other right vertebral artery stenosis to the skullbase. No proximal left subclavian artery stenosis despite plaque. Calcified plaque near the left vertebral artery origin, but no origin stenosis (series 8, image 138). Tortuous left P1 segment with soft plaque but no stenosis. Occasional left V2 segment calcified plaque. No left vertebral artery stenosis to the skullbase. CTA HEAD Posterior circulation: Fairly extensive bilateral vertebral artery V4 segment plaque and stenosis, worse on the left. Moderate multifocal left V4 and mild to moderate right V4 segment stenoses. Still, the PICA origins and vertebrobasilar junction remain patent. However, there is short segment occlusion of the proximal basilar artery about 5 mm distal to the vertebrobasilar junction (series 11, images 25  and 26). Flow is reconstituted in the distal basilar which is diminutive and irregular. The SCA origins remain patent. There is a fetal left PCA origin. The right P1 segment is patent but irregular, with moderate to severe stenosis. Right posterior communicating artery is diminutive or absent. Left PCA branches are within normal limits, but there is poor flow in the right PCA throughout its course. Anterior circulation: Both ICA siphons are patent with extensive calcified plaque. On the left there is mild to moderate precavernous and cavernous segment stenosis, with moderate left supraclinoid stenosis. Normal left ophthalmic and posterior communicating artery origins. On the right there is mild cavernous segment stenosis. Normal right ophthalmic artery origin. Patent carotid termini. Normal MCA and ACA origins. Anterior communicating artery and bilateral ACA branches are normal. Left MCA M1 segment, bifurcation, and proximal left MCA branches are within normal limits. Mild irregularity of some left M3 branches. Right MCA M1 segment, bifurcation, and right MCA branches are within normal limits. Venous sinuses: Patent. Anatomic variants: Fetal left PCA origin. Delayed phase: No abnormal enhancement identified. Stable gray-white matter differentiation throughout the brain. no intracranial mass effect. Review of the MIP images confirms the above findings IMPRESSION: 1. Severe posterior circulation atherosclerosis. 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 Reconstituted flow in the distal Basilar, but proximal right PCA stenosis and poor flow throughout the right PCA. 2. Widespread and occasionally bulky plaque in both carotid arteries including the ICA siphons, but no hemodynamically significant carotid stenosis in the neck. There is up to moderate stenosis of the left ICA siphon. 3. Otherwise negative anterior  circulation. 4. Stable CT appearance of the brain since 1015 hours today. Electronically Signed   By: Odessa Fleming M.D.   On: 01/31/2017 15:37   Ct Angio Neck W Or Wo Contrast  Result Date: 01/31/2017 CLINICAL DATA:  76 year old male with right side weakness and slurred speech for several days. Acute posterior circulation infarcts with very severe basilar artery stenosis on MRI and MRA today. EXAM: CT ANGIOGRAPHY HEAD AND NECK TECHNIQUE: Multidetector CT imaging of the head and neck was performed using the standard protocol during bolus administration of intravenous contrast. Multiplanar CT image reconstructions and MIPs were obtained to evaluate the vascular anatomy. Carotid stenosis measurements (when applicable) are obtained utilizing NASCET criteria, using the distal internal carotid diameter as the denominator. CONTRAST:  50 mL Isovue  370 COMPARISON:  Brain MRI, head and neck MRA 1206 hours today, and earlier FINDINGS: CTA NECK Skeleton: Prior sternotomy. Superior cervical spine degeneration including multilevel spondylolisthesis, and chronic C4-C5 ankylosis. Partially visible thoracic spine degeneration. No acute osseous abnormality identified. Visualized paranasal sinuses and mastoids are stable and well pneumatized. Upper chest: Partially visible cardiomegaly and prior CABG. No superior mediastinal lymphadenopathy. Dependent pulmonary atelectasis. Other neck: Negative.  No cervical lymphadenopathy. Aortic arch: Mild to moderate aortic arch soft and calcified plaque. Three vessel arch configuration. Right carotid system: No brachiocephalic or right CCA origin stenosis. Soft plaque along the anterior wall of the mid right CCA without stenosis. Bulky calcified plaque at the right carotid bifurcation with soft and calcified plaque extending into the right ICA origin and bulb, but less than 50 % stenosis with respect to the distal vessel (series 9, image 74). Left carotid system: No left CCA origin stenosis despite  soft and calcified plaque. Tortuous left CCA, with a kinked appearance at the anterior left thoracic inlet (series 5, image 139). Mild anterior wall soft and calcified plaque proximal to the left carotid bifurcation with no associated stenosis. Bulky calcified plaque at the left ICA origin and bulb, but stenosis is less than 50 % with respect to the distal vessel. Superimposed soft plaque in the distal left ICA bulb beyond which there is tortuosity of the vessel with a mildly kinked appearance. Soft and calcified plaque of the distal left ICA just below the skullbase without stenosis. Vertebral arteries: No proximal right subclavian artery stenosis despite plaque. Calcified plaque at the right vertebral artery origin with mild to moderate stenosis (series 8, image 140). No other right vertebral artery stenosis to the skullbase. No proximal left subclavian artery stenosis despite plaque. Calcified plaque near the left vertebral artery origin, but no origin stenosis (series 8, image 138). Tortuous left P1 segment with soft plaque but no stenosis. Occasional left V2 segment calcified plaque. No left vertebral artery stenosis to the skullbase. CTA HEAD Posterior circulation: Fairly extensive bilateral vertebral artery V4 segment plaque and stenosis, worse on the left. Moderate multifocal left V4 and mild to moderate right V4 segment stenoses. Still, the PICA origins and vertebrobasilar junction remain patent. However, there is short segment occlusion of the proximal basilar artery about 5 mm distal to the vertebrobasilar junction (series 11, images 25 and 26). Flow is reconstituted in the distal basilar which is diminutive and irregular. The SCA origins remain patent. There is a fetal left PCA origin. The right P1 segment is patent but irregular, with moderate to severe stenosis. Right posterior communicating artery is diminutive or absent. Left PCA branches are within normal limits, but there is poor flow in the right  PCA throughout its course. Anterior circulation: Both ICA siphons are patent with extensive calcified plaque. On the left there is mild to moderate precavernous and cavernous segment stenosis, with moderate left supraclinoid stenosis. Normal left ophthalmic and posterior communicating artery origins. On the right there is mild cavernous segment stenosis. Normal right ophthalmic artery origin. Patent carotid termini. Normal MCA and ACA origins. Anterior communicating artery and bilateral ACA branches are normal. Left MCA M1 segment, bifurcation, and proximal left MCA branches are within normal limits. Mild irregularity of some left M3 branches. Right MCA M1 segment, bifurcation, and right MCA branches are within normal limits. Venous sinuses: Patent. Anatomic variants: Fetal left PCA origin. Delayed phase: No abnormal enhancement identified. Stable gray-white matter differentiation throughout the brain. no intracranial mass effect. Review of the  MIP images confirms the above findings IMPRESSION: 1. Severe posterior circulation atherosclerosis. 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 Reconstituted flow in the distal Basilar, but proximal right PCA stenosis and poor flow throughout the right PCA. 2. Widespread and occasionally bulky plaque in both carotid arteries including the ICA siphons, but no hemodynamically significant carotid stenosis in the neck. There is up to moderate stenosis of the left ICA siphon. 3. Otherwise negative anterior circulation. 4. Stable CT appearance of the brain since 1015 hours today. Electronically Signed   By: Odessa FlemingH  Hall M.D.   On: 01/31/2017 15:37   Mr Maxine GlennMra Neck Wo Contrast  Result Date: 01/31/2017 CLINICAL DATA:  Right-sided weakness and slurred speech. EXAM: MRI HEAD WITHOUT CONTRAST MRA HEAD WITHOUT CONTRAST MRA NECK WITHOUT CONTRAST TECHNIQUE: Multiplanar, multiecho pulse sequences  of the brain and surrounding structures were obtained without intravenous contrast. Angiographic images of the Circle of Willis were obtained using MRA technique without intravenous contrast. Angiographic images of the neck were obtained using MRA technique without intravenous contrast. Carotid stenosis measurements (when applicable) are obtained utilizing NASCET criteria, using the distal internal carotid diameter as the denominator. COMPARISON:  10/29/2008 brain MRI.  Head CT from earlier today FINDINGS: MRI HEAD FINDINGS Brain: Confluent acute infarct in the left paramedian pons and to a lesser degree in the right paramedian pons. Clustered small acute infarcts in the bilateral peripheral cerebellum. Acute lacunar infarct in the right thalamus. No acute noted anterior circulation infarcts. There is extensive remote right cerebellar infarction, mainly affecting the upper hemisphere and peduncles. Chronic microvascular ischemia in the cerebral white matter, confluent around the lateral ventricles. No acute hemorrhage, hydrocephalus, or masslike findings. Single remote microhemorrhage in the central right cerebellum. Remote lacunar infarct in the left genu of corpus callosum. Vascular: There is a blurred appearance of the mid basilar on T1 and T2 weighted imaging. See below. Skull and upper cervical spine: Cervical facet arthropathy. Negative for marrow lesion. Sinuses/Orbits: Negative MRA HEAD FINDINGS There are symmetric carotid arteries. Fetal type left PCA. Mild anterior circulation atheromatous changes. Symmetric atheromatous vertebral arteries. In the proximal and mid basilar is critical stenosis with 2 flow gaps. The abnormality has a central luminal appearance concerning for thrombus or irregular/ulcerated plaque. There is no right posterior communicating artery. Diminutive flow seen in the distal basilar and in the regular right PCA. Normal flow in the left PCA which is served by posterior communicating  artery. No evidence of intramural hematoma on conventional brain MRI to suggest intracranial dissection. MRA NECK FINDINGS Limited noncontrast neck MRA. There is atherosclerosis of the bifurcations with at least moderate (50%) left ICA stenosis at the bulb. The visible aorta arch is normal caliber. There is antegrade flow in both vertebral arteries. The right vertebral artery ostium was not covered. Critical Value/emergent results were called by telephone at the time of interpretation on 01/31/2017 at 1:33 pm to Dr. Aneta MinsPHILLIP HOBBS, who verbally acknowledged these results. IMPRESSION: 1. 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 The narrowing is irregular and fairly long and could be thrombus and or irregular plaque. Diminished flow in the distal basilar and irregular right PCA. Left PCA is fetal type. 2. Acute infarcts in the left more than right pons, bilateral cerebellum, and right thalamus. 3. Remote infarcts in the upper right cerebellum and left corpus callosum genu. 4. Patent carotid and vertebral arteries in the neck. Left ICA origin stenosis that is at  least 50%. Electronically Signed   By: Marnee Spring M.D.   On: 01/31/2017 13:42   Mr Brain Wo Contrast  Result Date: 01/31/2017 CLINICAL DATA:  Right-sided weakness and slurred speech. EXAM: MRI HEAD WITHOUT CONTRAST MRA HEAD WITHOUT CONTRAST MRA NECK WITHOUT CONTRAST TECHNIQUE: Multiplanar, multiecho pulse sequences of the brain and surrounding structures were obtained without intravenous contrast. Angiographic images of the Circle of Willis were obtained using MRA technique without intravenous contrast. Angiographic images of the neck were obtained using MRA technique without intravenous contrast. Carotid stenosis measurements (when applicable) are obtained utilizing NASCET criteria, using the distal internal carotid diameter as the denominator. COMPARISON:  10/29/2008 brain MRI.  Head CT from earlier today FINDINGS: MRI HEAD FINDINGS Brain:  Confluent acute infarct in the left paramedian pons and to a lesser degree in the right paramedian pons. Clustered small acute infarcts in the bilateral peripheral cerebellum. Acute lacunar infarct in the right thalamus. No acute noted anterior circulation infarcts. There is extensive remote right cerebellar infarction, mainly affecting the upper hemisphere and peduncles. Chronic microvascular ischemia in the cerebral white matter, confluent around the lateral ventricles. No acute hemorrhage, hydrocephalus, or masslike findings. Single remote microhemorrhage in the central right cerebellum. Remote lacunar infarct in the left genu of corpus callosum. Vascular: There is a blurred appearance of the mid basilar on T1 and T2 weighted imaging. See below. Skull and upper cervical spine: Cervical facet arthropathy. Negative for marrow lesion. Sinuses/Orbits: Negative MRA HEAD FINDINGS There are symmetric carotid arteries. Fetal type left PCA. Mild anterior circulation atheromatous changes. Symmetric atheromatous vertebral arteries. In the proximal and mid basilar is critical stenosis with 2 flow gaps. The abnormality has a central luminal appearance concerning for thrombus or irregular/ulcerated plaque. There is no right posterior communicating artery. Diminutive flow seen in the distal basilar and in the regular right PCA. Normal flow in the left PCA which is served by posterior communicating artery. No evidence of intramural hematoma on conventional brain MRI to suggest intracranial dissection. MRA NECK FINDINGS Limited noncontrast neck MRA. There is atherosclerosis of the bifurcations with at least moderate (50%) left ICA stenosis at the bulb. The visible aorta arch is normal caliber. There is antegrade flow in both vertebral arteries. The right vertebral artery ostium was not covered. Critical Value/emergent results were called by telephone at the time of interpretation on 01/31/2017 at 1:33 pm to Dr. Aneta Mins HOBBS, who  verbally acknowledged these results. IMPRESSION: 1. 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 The narrowing is irregular and fairly long and could be thrombus and or irregular plaque. Diminished flow in the distal basilar and irregular right PCA. Left PCA is fetal type. 2. Acute infarcts in the left more than right pons, bilateral cerebellum, and right thalamus. 3. Remote infarcts in the upper right cerebellum and left corpus callosum genu. 4. Patent carotid and vertebral arteries in the neck. 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 Electronically Signed   By: Marnee Spring M.D.   On: 01/31/2017 13:42   Dg Swallowing Func-speech Pathology  Result Date: 02/01/2017 Objective Swallowing Evaluation: Type of Study: MBS-Modified Barium Swallow Study Patient Details Name: Brandon Howard MRN: [id number] Date of Birth: [birth year] Today's Date: 02/01/2017 Time: SLP Start Time (ACUTE ONLY): 1045-SLP Stop Time (ACUTE ONLY): 1109 SLP Time Calculation (min) (ACUTE ONLY): 24 min Past Medical History: Past Medical History: Diagnosis Date . Hypertension  . Stroke [facility])  Past Surgical History: No past surgical history on file. HPI: SANDY BLOUCH  a 75 y.o.malepast medical history significant for hypertension and stroke. Presents emergency room with right-sided weakness and slurred speech. Patient states symptoms started yesterday. Did not call for assistance until today. Difficult walking. EMS activated. Brought patient to the hospital for evaluation. Patient symptoms have not worsened or improved. ED course: CT of the head revealed new stroke. MRI of head showinginfracrts left worse then right pons, bilateral cerebellum and right thalamus as well as remote infarcts in right cerebellum and left corpus collosum. Subjective: The patient was seen in radiology for MBS to determine current swallowing physiology.  Assessment / Plan / Recommendation CHL IP CLINICAL IMPRESSIONS 02/01/2017 Clinical Impression MBS  was completed using thin liquids, nectar thick liquids, pureed material and dual textured solids.  The patient presented with oropharyngeal dysphagia.  The oral phase was characterized by adequate but delayed mastication of dual textured solids, oral residue given pureed material and dual textured solids and premature loss of the bolus seen across most textures.  In addition, decreased labial seal leads to anterior escape mostly given thin liquids.  The pharyngeal phase of the swallow was characterized by a swallow trigger in the pyriform sinuses given thin and nectar thick liquids, a swallow trigger in the vallecula given pureed material and dual textured solids, mildly decreased laryngeal vestibule closure and mild pyriform residue seen after the swallow which appeared to be secondary to premature closure of the UES.  Flash penetration was noted during the swallow given thin liquids via spoon sips.  Penetration into the laryngeal vestibule was seen during the swallowing given cup sips of thin liquids which the patient was unable to clear with a cued second swallow nor a cough and reswallow.  One episode of flash penetration was seen given spoon sips of nectar thick liquids during the swallow but was not replicated even give self fed cup sips.  Esophageal sweep did not reveal overt issues.  Recommend a dysphagia 3 diet with nectar thick liquids.  The patient should take 1 sip at a time and not use straws.  Given the oral residue and oral weakness there is potential concern for oral residue given solids.  Instructed nursing to downgrade his diet to dysphagia 1 if issues are noted with pocketing/oral residue.  ST will follow during acute stay.  The patient would also benefit from ST follow up at the next level of care.   SLP Visit Diagnosis Dysphagia, oropharyngeal phase (R13.12) Attention and concentration deficit following -- Frontal lobe and executive function deficit following -- Impact on safety and function  Mild aspiration risk   CHL IP TREATMENT RECOMMENDATION 02/01/2017 Treatment Recommendations Therapy as outlined in treatment plan below   Prognosis 02/01/2017 Prognosis for Safe Diet Advancement Fair Barriers to Reach Goals -- Barriers/Prognosis Comment -- CHL IP DIET RECOMMENDATION 02/01/2017 SLP Diet Recommendations Dysphagia 3 (Mech soft) solids;Nectar thick liquid Liquid Administration via Cup Medication Administration Crushed with puree Compensations Slow rate;Small sips/bites Postural Changes Remain semi-upright after after feeds/meals (Comment);Seated upright at 90 degrees   CHL IP OTHER RECOMMENDATIONS 02/01/2017 Recommended Consults -- Oral Care Recommendations Oral care before and after PO;Oral care BID Other Recommendations Order thickener from pharmacy;Prohibited food (jello, ice cream, thin soups)   CHL IP FOLLOW UP RECOMMENDATIONS 02/01/2017 Follow up Recommendations Other (comment)   CHL IP FREQUENCY AND DURATION 02/01/2017 Speech Therapy Frequency (ACUTE ONLY) min 2x/week Treatment Duration 2 weeks      CHL IP ORAL PHASE 02/01/2017 Oral Phase Impaired Oral - Pudding Teaspoon -- Oral - Pudding Cup --  Oral - Honey Teaspoon -- Oral - Honey Cup -- Oral - Nectar Teaspoon -- Oral - Nectar Cup -- Oral - Nectar Straw -- Oral - Thin Teaspoon Right anterior bolus loss Oral - Thin Cup Right anterior bolus loss Oral - Thin Straw -- Oral - Puree Delayed oral transit;Premature spillage Oral - Mech Soft -- Oral - Regular -- Oral - Multi-Consistency Delayed oral transit;Premature spillage Oral - Pill -- Oral Phase - Comment --  CHL IP PHARYNGEAL PHASE 02/01/2017 Pharyngeal Phase Impaired Pharyngeal- Pudding Teaspoon -- Pharyngeal -- Pharyngeal- Pudding Cup -- Pharyngeal -- Pharyngeal- Honey Teaspoon -- Pharyngeal -- Pharyngeal- Honey Cup -- Pharyngeal -- Pharyngeal- Nectar Teaspoon Delayed swallow initiation-pyriform sinuses;Penetration/Aspiration during swallow Pharyngeal Material enters airway, remains ABOVE vocal cords then  ejected out Pharyngeal- Nectar Cup Delayed swallow initiation-pyriform sinuses;Pharyngeal residue - pyriform Pharyngeal -- Pharyngeal- Nectar Straw -- Pharyngeal -- Pharyngeal- Thin Teaspoon Delayed swallow initiation-pyriform sinuses;Penetration/Aspiration during swallow Pharyngeal Material enters airway, remains ABOVE vocal cords then ejected out Pharyngeal- Thin Cup Delayed swallow initiation-pyriform sinuses;Penetration/Aspiration during swallow Pharyngeal Material enters airway, remains ABOVE vocal cords and not ejected out Pharyngeal- Thin Straw -- Pharyngeal -- Pharyngeal- Puree Delayed swallow initiation-vallecula;Pharyngeal residue - pyriform Pharyngeal -- Pharyngeal- Mechanical Soft -- Pharyngeal -- Pharyngeal- Regular -- Pharyngeal -- Pharyngeal- Multi-consistency Delayed swallow initiation-vallecula;Pharyngeal residue - pyriform Pharyngeal -- Pharyngeal- Pill -- Pharyngeal -- Pharyngeal Comment --  CHL IP CERVICAL ESOPHAGEAL PHASE 02/01/2017 Cervical Esophageal Phase WFL Pudding Teaspoon -- Pudding Cup -- Honey Teaspoon -- Honey Cup -- Nectar Teaspoon -- Nectar Cup -- Nectar Straw -- Thin Teaspoon -- Thin Cup -- Thin Straw -- Puree -- Mechanical Soft -- Regular -- Multi-consistency -- Pill -- Cervical Esophageal Comment -- No flowsheet data found. Dimas Aguas, MA, CCC-SLP Acute Rehab SLP [phone] [name] 02/01/2017, 11:24 AM              Mr Maxine Glenn Head Wo Contrast  Result Date: 01/31/2017 CLINICAL DATA:  Right-sided weakness and slurred speech. EXAM: MRI HEAD WITHOUT CONTRAST MRA HEAD WITHOUT CONTRAST MRA NECK WITHOUT CONTRAST TECHNIQUE: Multiplanar, multiecho pulse sequences of the brain and surrounding structures were obtained without intravenous contrast. Angiographic images of the Circle of Willis were obtained using MRA technique without intravenous contrast. Angiographic images of the neck were obtained using MRA technique without intravenous contrast. Carotid stenosis measurements (when  applicable) are obtained utilizing NASCET criteria, using the distal internal carotid diameter as the denominator. COMPARISON:  10/29/2008 brain MRI.  Head CT from earlier today FINDINGS: MRI HEAD FINDINGS Brain: Confluent acute infarct in the left paramedian pons and to a lesser degree in the right paramedian pons. Clustered small acute infarcts in the bilateral peripheral cerebellum. Acute lacunar infarct in the right thalamus. No acute noted anterior circulation infarcts. There is extensive remote right cerebellar infarction, mainly affecting the upper hemisphere and peduncles. Chronic microvascular ischemia in the cerebral white matter, confluent around the lateral ventricles. No acute hemorrhage, hydrocephalus, or masslike findings. Single remote microhemorrhage in the central right cerebellum. Remote lacunar infarct in the left genu of corpus callosum. Vascular: There is a blurred appearance of the mid basilar on T1 and T2 weighted imaging. See below. Skull and upper cervical spine: Cervical facet arthropathy. Negative for marrow lesion. Sinuses/Orbits: Negative MRA HEAD FINDINGS There are symmetric carotid arteries. Fetal type left PCA. Mild anterior circulation atheromatous changes. Symmetric atheromatous vertebral arteries. In the proximal and mid basilar is critical stenosis with 2 flow gaps. The abnormality has a central luminal appearance concerning for  thrombus or irregular/ulcerated plaque. There is no right posterior communicating artery. Diminutive flow seen in the distal basilar and in the regular right PCA. Normal flow in the left PCA which is served by posterior communicating artery. No evidence of intramural hematoma on conventional brain MRI to suggest intracranial dissection. MRA NECK FINDINGS Limited noncontrast neck MRA. There is atherosclerosis of the bifurcations with at least moderate (50%) left ICA stenosis at the bulb. The visible aorta arch is normal caliber. There is antegrade flow in  both vertebral arteries. The right vertebral artery ostium was not covered. Critical Value/emergent results were called by telephone at the time of interpretation on 01/31/2017 at 1:33 pm to Dr. Aneta Mins HOBBS, who verbally acknowledged these results. IMPRESSION: 1. 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 The narrowing is irregular and fairly long and could be thrombus and or irregular plaque. Diminished flow in the distal basilar and irregular right PCA. Left PCA is fetal type. 2. Acute infarcts in the left more than right pons, bilateral cerebellum, and right thalamus. 3. Remote infarcts in the upper right cerebellum and left corpus callosum genu. 4. Patent carotid and vertebral arteries in the neck. 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 Electronically Signed   By: Marnee Spring M.D.   On: 01/31/2017 13:42    Assessment/Plan: Diagnosis: Bilateral CVA Labs and images independently reviewed.  Records reviewed and summated above. Stroke: Continue secondary stroke prophylaxis and Risk Factor Modification listed below:   Antiplatelet therapy:   Blood Pressure Management:  Continue current medication with prn's with permisive HTN per primary team Statin Agent:   Right sided hemiparesis: fit for orthosis to prevent contractures (resting hand splint for day, wrist cock up splint at night) Motor recovery: Fluoxetine  1. Does the need for close, 24 hr/day medical supervision in concert with the patient's rehab needs make it unreasonable for this patient to be served in a less intensive setting? Yes  2. Co-Morbidities requiring supervision/potential complications: dysphagia (advance diet as tolerated), hyperglycemia (Monitor in accordance with exercise and adjust meds as necessary), dysarthria, cocaine abuse (counsel), HTN (monitor and provide prns in accordance with increased physical exertion and pain), CVA with residual RUE weakness (transition from heparin ggt when appropriate) 3. Due to  bladder management, bowel management, safety, disease management, medication administration and patient education, does the patient require 24 hr/day rehab nursing? Yes 4. Does the patient require coordinated care of a physician, rehab nurse, PT (1-2 hrs/day, 5 days/week), OT (1-2 hrs/day, 5 days/week) and SLP (1-2 hrs/day, 5 days/week) to address physical and functional deficits in the context of the above medical diagnosis(es)? Yes Addressing deficits in the following areas: balance, endurance, locomotion, strength, transferring, bowel/bladder control, bathing, dressing, toileting, speech, swallowing and psychosocial support 5. Can the patient actively participate in an intensive therapy program of at least 3 hrs of therapy per day at least 5 days per week? Yes 6. The potential for patient to make measurable gains while on inpatient rehab is excellent 7. Anticipated functional outcomes upon discharge from inpatient rehab are supervision and min assist  with PT, supervision and min assist with OT, modified independent and supervision with SLP. 8. Estimated rehab length of stay to reach the above functional goals is: 18-23 days. 9. Anticipated D/C setting: Other 10. Anticipated post D/C treatments: SNF 11. Overall Rehab/Functional Prognosis: good  RECOMMENDATIONS: This patient's condition is appropriate for continued rehabilitative care in the following setting: CIR Patient has agreed to participate in recommended program. Yes Note  that insurance prior authorization may be required for reimbursement for recommended care.  Comment: Rehab Admissions Coordinator to follow up.  Maryla Morrow, MD, Earlie Counts, [place] 02/02/2017

## 2017-02-02 NOTE — Progress Notes (Signed)
Physical Therapy Treatment Patient Details Name: Brandon HolterHorace C Peale MRN: [id number] DOB: Oct 14, 1940 Today's Date: 02/02/2017    History of Present Illness 75 y.o.malepast medical history significant for hypertension and stroke. Presents emergency room with right-sided weakness and slurred speech. 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 Was able to perform transfers and ambulation with up to mod assist +2 for balance support and safety. Will continue to follow and progress as able per POC.   Follow Up Recommendations  CIR     Equipment Recommendations  Other (comment) (TBA if no CIR)    Recommendations for Other Services Rehab consult     Precautions / Restrictions Precautions Precautions: Fall Restrictions Weight Bearing Restrictions: No    Mobility  Bed Mobility Overal bed mobility: Needs Assistance Bed Mobility: Supine to Sit;Sit to Supine     Supine to sit: Min assist     General bed mobility comments: Assist with bed pad to complete scooting to EOB.   Transfers Overall transfer level: Needs assistance Equipment used: Rolling walker (2 wheeled) Transfers: Sit to/from Stand Sit to Stand: +2 safety/equipment;Mod assist         General transfer comment: VC's for hand placement on seated surface for safety. Pt was able to power up to full stand with +2 assist for safety.   Ambulation/Gait Ambulation/Gait assistance: Mod assist;+2 physical assistance Ambulation Distance (Feet): 25 Feet Assistive device: Rolling walker (2 wheeled) Gait Pattern/deviations: Step-to pattern;Decreased step length - right;Decreased dorsiflexion - right;Steppage;Ataxic;Narrow base of support Gait velocity: Decreased Gait  velocity interpretation: Below normal speed for age/gender General Gait Details: VC's for improved posture, balance support, and weight shift. +2 for chair follow and line management.    Stairs            Wheelchair Mobility    Modified Rankin (Stroke Patients Only) Modified Rankin (Stroke Patients Only) Pre-Morbid Rankin Score: No symptoms Modified Rankin: Moderately severe disability     Balance Overall balance assessment: Needs assistance Sitting-balance support: Single extremity supported;Feet unsupported Sitting balance-Leahy Scale: Poor     Standing balance support: No upper extremity supported Standing balance-Leahy Scale: Poor Standing balance comment: posterior lean                            Cognition Arousal/Alertness: Awake/alert Behavior During Therapy: Flat affect Overall Cognitive Status: Difficult to assess                                        Exercises      General Comments        Pertinent Vitals/Pain Pain Assessment: Faces Faces Pain Scale: No hurt Pain Intervention(s): Monitored during session    Home Living                      Prior Function            PT Goals (current goals can now be found in the care plan section) Acute Rehab PT Goals Patient Stated Goal: pt unable to state; agrees to PT goals PT Goal Formulation: With patient Time For Goal Achievement:  02/15/17 Potential to Achieve Goals: Good Progress towards PT goals: Progressing toward goals    Frequency    Min 4X/week      PT Plan Current plan remains appropriate    Co-evaluation              AM-PAC PT "6 Clicks" Daily Activity  Outcome Measure  Difficulty turning over in bed (including adjusting bedclothes, sheets and blankets)?: A Lot Difficulty moving from lying on back to sitting on the side of the bed? : A Lot Difficulty sitting down on and standing up from a chair with arms (e.g., wheelchair, bedside  commode, etc,.)?: A Lot Help needed moving to and from a bed to chair (including a wheelchair)?: A Lot Help needed walking in hospital room?: Total Help needed climbing 3-5 steps with a railing? : Total 6 Click Score: 10    End of Session Equipment Utilized During Treatment: Gait belt Activity Tolerance: Patient tolerated treatment well Patient left: in chair;with call bell/phone within reach;with chair alarm set Nurse Communication: Mobility status PT Visit Diagnosis: Ataxic gait (R26.0);Hemiplegia and hemiparesis Hemiplegia - Right/Left: Right Hemiplegia - dominant/non-dominant: Dominant Hemiplegia - caused by: Cerebral infarction     Time: 0805-0827 PT Time Calculation (min) (ACUTE ONLY): 22 min  Charges:  $Gait Training: 8-22 mins                    G Codes:       Conni Slipper, PT, DPT Acute Rehabilitation Services Pager: [phone]    Marylynn Pearson 02/02/2017, 8:36 AM

## 2017-02-02 NOTE — Progress Notes (Signed)
STROKE TEAM PROGRESS NOTE       HISTORY OF PRESENT ILLNESS (per record) Brandon Howard is an 76 y.o. male with a past medical history significant for CVA with residual right arm weakness and hypertension who presents to [facility] with complaints of worsening right sided weakness and trouble speaking. His last known normal was Wednesday 01/21/17. Brandon Howard noticed a significant worsening of his speech and right sided weakness over the last two days, although his son reports that Brandon Howard's speech has worsened significantly since approximately 1000.  Brandon Howard was brought to [facility] for further evaluation. Based on the initial timing of last known well, a code stroke was not activated. On my visit, he very clear has things to say, but he is very difficult to understand. I am only able to make out a few words he states. He does endorse being very hungry, stating that he ate this morning.  At baseline, Brandon Howard lives independently in an independent living facility. Following is previous stroke he had trouble walking and right arm weakness. His son states that he walks fairly well now, only occasionally using a cane or walker.  Pertinent Imaging: MRI/MRA head/neck 01/31/17:       1. 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 The narrowing is irregular and fairly long and could be thrombus and or irregular plaque. Diminished flow in the distal basilar and irregular right PCA. Left PCA is fetal type.      2. Acute infarcts in the left more than right pons, bilateral cerebellum, and right thalamus.      3. Remote infarcts in the upper right cerebellum and left corpus callosum genu.      4. Patent carotid and vertebral arteries in the neck. 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 CT head 01/31/17: Positive for ill-defined hypoattenuation in the left hemi pons concerning for an acute/subacute infarct. Less specific hypoattenuation in the right corona radiata is new compared to 2010.  Sequelae of prior right PCA territory infarct CTA head/neck 01/31/17: Severe posterior circulation atherosclerosis. 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 Reconstituted flow in the distal Basilar, but proximal right PCA stenosis and poor flow throughout the right PCA..  Date last known well: Date: 01/21/2017 Time last known well: Unable to determine  tPA Given: No: MRI changes already visible  Modified Rankin Score: 1  Stroke Risk Factors - hypertension   SUBJECTIVE (INTERVAL HISTORY)    Dysarthria is improving.  He  States he has quit cocaine OBJECTIVE Temp:  [97.6 F (36.4 C)-98 F (36.7 C)] 97.9 F (36.6 C) (08/06 1200) Pulse Rate:  [60-98] 78 (08/06 1200) Cardiac Rhythm: Normal sinus rhythm (08/05 2000) Resp:  [7-26] 19 (08/06 1200) BP: (128-168)/(64-114) 142/76 (08/06 1200) SpO2:  [93 %-100 %] 96 % (08/06 1200) Weight:  [155 lb 13.8 oz (70.7 kg)] 155 lb 13.8 oz (70.7 kg) (08/06 0500)  CBC:  Recent Labs Lab 01/31/17 0940  02/01/17 0524 02/02/17 0243  WBC 6.1  --  6.2 6.9  NEUTROABS 4.0  --   --   --   HGB 15.0  < > 15.9 15.0  HCT 46.1  < > 46.9 44.6  MCV 92.0  --  91.1 90.3  PLT 233  --  235 231  < > = values in this interval not displayed.  Basic Metabolic Panel:   Recent Labs Lab 01/31/17 1627 02/01/17 0524 02/02/17 0243  NA  --  142 140  K  --  3.2* 3.5  CL  --  106 109  CO2  --  26 24  GLUCOSE  --  87 118*  BUN  --  17 12  CREATININE  --  0.69 0.77  CALCIUM  --  8.5* 8.4*  MG 2.4 2.1  --   PHOS 3.5 3.4  --     Lipid Panel:     Component Value Date/Time   CHOL 112 02/01/2017 0524   TRIG 67 02/01/2017 0524   HDL 43 02/01/2017 0524   CHOLHDL 2.6 02/01/2017 0524   VLDL 13 02/01/2017 0524   LDLCALC 56 02/01/2017 0524   HgbA1c:  Lab Results  Component Value Date   HGBA1C 5.3 02/01/2017   Urine Drug Screen:     Component Value  Date/Time   LABOPIA NONE DETECTED 01/31/2017 1627   COCAINSCRNUR POSITIVE (A) 01/31/2017 1627   LABBENZ NONE DETECTED 01/31/2017 1627   AMPHETMU NONE DETECTED 01/31/2017 1627   THCU NONE DETECTED 01/31/2017 1627   LABBARB NONE DETECTED 01/31/2017 1627    Alcohol Level     Component Value Date/Time   ETH <5 01/31/2017 0940    IMAGING  Ct Angio Head W Or Wo Howard Ct Angio Neck W Or Wo Howard 01/31/2017 1. Severe posterior circulation atherosclerosis. 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 Reconstituted flow in the distal Basilar, but proximal right PCA stenosis and poor flow throughout the right PCA.  2. Widespread and occasionally bulky plaque in both carotid arteries including the ICA siphons, but no hemodynamically significant carotid stenosis in the neck. There is up to moderate stenosis of the left ICA siphon.  3. Otherwise negative anterior circulation.  4. Stable CT appearance of the brain since 1015 hours today.    Ct Head Wo Howard 01/31/2017 1. Positive for ill-defined hypoattenuation in the left hemi pons concerning for an acute/subacute infarct.  2. Less specific hypoattenuation in the right corona radiata is new compared to 2010 and likely represents a region of ischemic insult which has occurred sometime in the interim. This may be chronic, or subacute.  3. Sequelae of prior right PCA territory infarct with resultant encephalomalacia.  4. Intracranial atherosclerosis.    Mr Maxine GlennMra Neck Wo Howard Mr Maxine GlennMra Neck Wo Howard 01/31/2017 1. 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 The narrowing is irregular and fairly long and could be thrombus and or irregular plaque. Diminished flow in the distal basilar and irregular right PCA. Left PCA is fetal type.  2. Acute infarcts in the left more than right pons, bilateral cerebellum, and right thalamus.  3. Remote  infarcts in the upper right cerebellum and left corpus callosum genu. 4. Patent carotid and vertebral arteries in the neck. 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     PHYSICAL EXAM Obese elderly male not in distress. . Afebrile. Head is nontraumatic. Neck is supple without bruit.    Cardiac exam no murmur or gallop. Lungs are clear to auscultation. Distal pulses are well felt.  Neurological Exam :  Await, alert. Oriented 3. Moderate dysarthria . No aphasia. Follows commands well. Naming, repetition, comprehension - intact. Right upper and lower facial mild weakness    Mildly weak eye closure on the right. RUE 3/5 LUE 5/5 with mainly weakness of right grip and intrinsic hand muscles. Orbits left over right upper extremity. BLE 5/5  plantars upgoing bilateral. FTN intact on the left but unable to do on right due to weakness. Dysmetria on HTS bilaterally. EOMI. PERL. Gait deferred      ASSESSMENT/PLAN Brandon Howard is a 76 y.o. male with history of a previous stroke and hypertension presenting with worsening right-sided weakness and difficulty speaking. He did not receive IV t-PA due to late presentation.  Stroke:  Acute infarcts in the left more than right pons, bilateral cerebellum, and right thalamus - secondary to basilar stenosis large vessel atherosclerosis  Resultant  Severe right UE weakness and severe dysarthria and mild incoordination.  CT head - Positive for ill-defined hypoattenuation in the left hemi pons concerning for an acute/subacute infarct.   MRI head - Acute infarcts in the left more than right pons, bilateral cerebellum, and right thalamus.   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  Carotid Doppler - CTA neck 2D Echo -Left ventricle: The cavity size was normal. Systolic function was   normal. The estimated ejection fraction was in the range of 55%   to 60%. Wall motion was normal; there were no  regional wall    motion abnormalities  LDL - 56  HgbA1c - 5.3  VTE prophylaxis - IV heparin DIET DYS 3 Room service appropriate? Yes; Fluid consistency: Nectar Thick  No antithrombotic prior to admission, now on aspirin 300 mg suppository daily and heparin IV  Patient counseled to be compliant with his antithrombotic medications  Ongoing aggressive stroke risk factor management  Therapy recommendations: CLR Disposition: CLR  Hypertension  Stable - systolic blood pressure mildly low at times in the setting of basilar artery stenosis.  Permissive hypertension (OK if < 220/120) but gradually normalize in 5-7 days  Long-term BP goal normotensive  Hyperlipidemia  Home meds: Zocor 40 mg daily not resumed in hospital  LDL 56, goal < 70  Add Zocor when PO access is available.  Continue statin at discharge    Other Stroke Risk Factors  Advanced age  ETOH use, advised to drink no more than 1 drink a day   Hx stroke/TIA  Substance abuse   Other Active Problems  UDS - Cocaine positive   PLAN  Discontinue IV heparin and changed to aspirin and Plavix. Transfer to floor bed. Await inpatient rehabilitation bed after insurance approval. Discussed with Dr. Sharon Seller. Greater than 50% time during this 25 minute visit was spent on counseling and coordination of care about stroke risk discussion, prevention and treatment and answering questions. Patient counseled to quit alcohol and cocaine and is agreeable. Follow-up as an outpatient in the stroke clinic with Dr. Roda Shutters in 6 weeks. Stroke team will sign off. Kindly call for questions.  Hospital day # 2   To contact Stroke Continuity provider, please refer to [email or web address]. After hours, contact General Neurology

## 2017-02-02 NOTE — Progress Notes (Signed)
  Speech Language Pathology Treatment: Dysphagia;Cognitive-Linquistic  Patient Details Name: Brandon Howard MRN: [id number] DOB: [birth year] Today's Date: 02/02/2017 Time: [id number] SLP Time Calculation (min) (ACUTE ONLY): 13 min  Assessment / Plan / Recommendation Clinical Impression  Pt consumed soft solids and nectar thick liquids with Min cues provided for clearance of residuals pocketed on his right side. He had coughing x2 following cup sips of nectar thick liquids, but with Min cues for slower pacing and smaller bites/sips he had no further signs of aspiration/penetration. Recommend to continue with current diet and precautions for now. Pt also indicated that his speech is more dysarthric now than after his prior stroke. SLP provided education and Mod cues for use of speech intelligibility strategies for communication at the phrase level. He would benefit from CIR upon d/c.   HPI HPI: Brandon ProvencalHorace C Underwoodis a 76 y.o.malepast medical history significant for hypertension and stroke. Presents emergency room with right-sided weakness and slurred speech. Patient states symptoms started yesterday. Did not call for assistance until today. Difficult walking. EMS activated. Brought patient to the hospital for evaluation. Patient symptoms have not worsened or improved. ED course: CT of the head revealed new stroke. MRI of head showinginfracrts left worse then right pons, bilateral cerebellum and right thalamus as well as remote infarcts in right cerebellum and left corpus collosum.      SLP Plan  Continue with current plan of care       Recommendations  Diet recommendations: Dysphagia 3 (mechanical soft);Nectar-thick liquid Liquids provided via: Cup;No straw Medication Administration: Crushed with puree Supervision: Patient able to self feed;Full supervision/cueing for compensatory strategies Compensations: Slow rate;Small sips/bites;Lingual sweep for clearance of pocketing Postural Changes  and/or Swallow Maneuvers: Seated upright 90 degrees                Oral Care Recommendations: Oral care BID Follow up Recommendations: Inpatient Rehab SLP Visit Diagnosis: Dysphagia, oropharyngeal phase (R13.12);Dysarthria and anarthria (R47.1) Plan: Continue with current plan of care       GO                Maxcine Hamaiewonsky, Jailine Lieder 02/02/2017, 1:33 PM  Maxcine HamLaura Paiewonsky, M.A. CCC-SLP [phone]

## 2017-02-02 NOTE — Progress Notes (Signed)
ANTICOAGULATION CONSULT NOTE  Pharmacy Consult for heparin Indication: stroke  No Known Allergies  Patient Measurements: Height: 5\' 7"  (170.2 cm) Weight: 155 lb 13.8 oz (70.7 kg) IBW/kg (Calculated) : 66.1 Heparin Dosing Weight: 68.9kg  Vital Signs: Temp: 97.9 F (36.6 C) (08/06 1200) Temp Source: Oral (08/06 1200) BP: 142/76 (08/06 1200) Pulse Rate: 78 (08/06 1200)  Labs:  Recent Labs  01/31/17 0940 01/31/17 0949  02/01/17 0524 02/01/17 1003 02/02/17 0243 02/02/17 1201  HGB 15.0 15.6  --  15.9  --  15.0  --   HCT 46.1 46.0  --  46.9  --  44.6  --   PLT 233  --   --  235  --  231  --   APTT 27  --   --   --   --   --   --   LABPROT 14.1  --   --   --   --   --   --   INR 1.09  --   --   --   --   --   --   HEPARINUNFRC  --   --   < >  --  0.41 0.17* 0.29*  CREATININE 0.98 0.80  --  0.69  --  0.77  --   < > = values in this interval not displayed.  Estimated Creatinine Clearance: 73.4 mL/min (by C-G formula based on SCr of 0.77 mg/dL).   Medical History: Past Medical History:  Diagnosis Date  . Gout   . Hepatitis   . Hypertension   . Pancreatitis   . Right knee pain   . Stroke (HCC)   . Vitamin B 12 deficiency     Medications:  Infusions:  . heparin 850 Units/hr (02/02/17 1100)    Assessment: 75 yom presented to the hospital with right sided weakness found to have an acute stroke. Started on IV heparin per neurology. CBC WNL and he is not on anticoagulation PTA. To stop if patient has worsening mental status/severe headache. Heparin level slightly below goal 0.29. No bleeding or infusion issues documented.  Goal of Therapy:  Heparin level 0.3-0.5 units/ml Monitor platelets by anticoagulation protocol: Yes   Plan:  Increase heparin gtt to 900 units/hr Daily heparin level and CBC Monitor for s/sx of bleeding  Katrenia Alkins L Buford Bremer 02/02/2017,12:35 PM

## 2017-02-02 NOTE — Care Management Note (Signed)
Case Management Note  Patient Details  Name: Brandon Howard MRN: [id number] Date of Birth: Nov 30, 1940  Subjective/Objective:   Pt admitted on 01/31/17 with cerebellar stroke.  PTA, pt independent, lives alone.                   Action/Plan: Pt states he has a son who can assist at discharge.  PT recommending CIR.  Will follow for OT recommendations and discharge planning as pt progresses.    Expected Discharge Date:                  Expected Discharge Plan:  IP Rehab Facility  In-House Referral:     Discharge planning Services  CM Consult  Post Acute Care Choice:    Choice offered to:     DME Arranged:    DME Agency:     HH Arranged:    HH Agency:     Status of Service:  In process, will continue to follow  If discussed at Long Length of Stay Meetings, dates discussed:    Additional Comments:  Quintella BatonJulie W. Avett Reineck, RN, BSN  Trauma/Neuro ICU Case Manager [phone]

## 2017-02-03 LAB — BASIC METABOLIC PANEL
ANION GAP: 8 (ref 5–15)
BUN: 8 mg/dL (ref 6–20)
CO2: 26 mmol/L (ref 22–32)
Calcium: 8.9 mg/dL (ref 8.9–10.3)
Chloride: 107 mmol/L (ref 101–111)
Creatinine, Ser: 0.71 mg/dL (ref 0.61–1.24)
GFR calc Af Amer: >60 mL/min (ref >60)
Glucose, Bld: 110 mg/dL — ABNORMAL HIGH (ref 65–99)
POTASSIUM: 3.8 mmol/L (ref 3.5–5.1)
SODIUM: 141 mmol/L (ref 135–145)

## 2017-02-03 LAB — GLUCOSE, CAPILLARY: GLUCOSE-CAPILLARY: 190 mg/dL — AB (ref 65–99)

## 2017-02-03 NOTE — NC FL2 (Signed)
[place] MEDICAID FL2 LEVEL OF CARE SCREENING TOOL     IDENTIFICATION  Patient Name: Brandon Howard Birthdate: 01-Jan-1941 Sex: male Admission Date (Current Location): 01/31/2017  [facility] [place]   Facility and Address[facility]  [facility], [address], [place], [place] [address]      Provider Number: [id number]  Attending Physician Name and Address:  Jerald Kiefhiu, Stephen K, MD  Relative Name and Phone Number:       Current Level of Care: Hospital Recommended Level of Care: Skilled Nursing Facility Prior Approval Number:    Date Approved/Denied:   PASRR Number: [id number]  Discharge Plan: SNF    Current Diagnoses: Patient Active Problem List   Diagnosis Date Noted  . Dysphagia, post-stroke   . History of CVA with residual deficit   . Hyperglycemia   . Benign essential HTN   . Dysarthria, post-stroke   . Cocaine abuse   . Stroke (cerebrum) (HCC) 01/31/2017    Orientation RESPIRATION BLADDER Height & Weight     Time, Situation, Place, Self  Normal Incontinent, Indwelling catheter Weight: 152 lb 9.6 oz (69.2 kg) Height:  5\' 7"  (170.2 cm)  BEHAVIORAL SYMPTOMS/MOOD NEUROLOGICAL BOWEL NUTRITION STATUS      Continent Diet (dysphagia 3)  AMBULATORY STATUS COMMUNICATION OF NEEDS Skin   Extensive Assist Verbally Normal                       Personal Care Assistance Level of Assistance  Bathing, Dressing Bathing Assistance: Maximum assistance   Dressing Assistance: Maximum assistance     Functional Limitations Info  Speech     Speech Info: Impaired (speech is slurred, hard to understand)    SPECIAL CARE FACTORS FREQUENCY  PT (By licensed PT), OT (By licensed OT)     PT Frequency: 5x/wk OT Frequency: 5x/wk            Contractures      Additional Factors Info  Code Status, Allergies Code Status Info: Full Allergies Info: NKA           Current Medications (02/03/2017):  This is the current hospital  active medication list Current Facility-Administered Medications  Medication Dose Route Frequency Provider Last Rate Last Dose  .  stroke: mapping our early stages of recovery book   Does not apply Once Haydee SalterHobbs, Phillip M, MD      . acetaminophen (TYLENOL) tablet 650 mg  650 mg Oral Q4H PRN Haydee SalterHobbs, Phillip M, MD       Or  . acetaminophen (TYLENOL) suppository 650 mg  650 mg Rectal Q4H PRN Haydee SalterHobbs, Phillip M, MD      . allopurinol (ZYLOPRIM) tablet 100 mg  100 mg Oral Daily Jetty DuhamelMcClung, Jeffrey T, MD   100 mg at 02/03/17 1032  . aspirin chewable tablet 81 mg  81 mg Oral Daily Patteson, Remi DeterSamuel A, NP   81 mg at 02/03/17 1031  . clopidogrel (PLAVIX) tablet 75 mg  75 mg Oral Daily Patteson, Samuel A, NP   75 mg at 02/03/17 1031  . heparin injection 5,000 Units  5,000 Units Subcutaneous Q8H Lonia BloodMcClung, Jeffrey T, MD   5,000 Units at 02/03/17 [id number]  . hydrALAZINE (APRESOLINE) injection 10 mg  10 mg Intravenous Q8H PRN Paulette BlanchAldridge, Jamie H, PA-C      . MEDLINE mouth rinse  15 mL Mouth Rinse BID Lonia BloodMcClung, Jeffrey T, MD   15 mL at 02/02/17 1256  . RESOURCE THICKENUP CLEAR  Oral PRN [name], MD      . senna-docusate (Senokot-S) tablet 1 tablet  1 tablet Oral QHS PRN Haydee Salter, MD      . simvastatin (ZOCOR) tablet 40 mg  40 mg Oral QPM [name], MD   40 mg at 02/02/17 1804     Discharge Medications: Please see discharge summary for a list of discharge medications.  Relevant Imaging Results:  Relevant Lab Results:   Additional Information SS#: [id number]  Baldemar Lenis, LCSW

## 2017-02-03 NOTE — Progress Notes (Addendum)
PROGRESS NOTE    Brandon Howard  WUJ:[id number] DOB: [birth year] DOA: 01/31/2017 PCP: [name], MD    Brief Narrative:  76 y.o.malew/ a hx of HTN and stroke w/ residual R arm weakness who presented to the ED with worsening right-sided weakness and slurred speech of at least 24hrs duration. EMS brought patient to the ED where a CT of the head revealed a new stroke.   Assessment & Plan:   Active Problems:   Stroke (cerebrum) (HCC)   Dysphagia, post-stroke   History of CVA with residual deficit   Hyperglycemia   Benign essential HTN   Dysarthria, post-stroke   Cocaine abuse  Acute B pontine, B cerebellar, and R thalamic CVAs - stenotic basilar artery w/ sub-occlusive thrombus  Severe right UE weakness and severe dysarthria  - Patient to continue oral ASA + Plavix as per Stroke Team recommendations  HTN   - BP remains stable at present - continued on PRN hydralazine at present  HLD - continue home simvastatin - LDL controlled  Cocaine use  - UDS + at time of admit - pt admits to frequent use  - Patient was earlier counseled on absolute need to stop  Hypokalemia  -corrected -Cont to monitor  CAD s/p CABG 2005 -No recent chest pain  Possible EtOH abuse  -No evidence of withdrawals recently -Continuing to monitor  DVT prophylaxis: SCD's Code Status: Full Family Communication: Pt in room, family not at bedside Disposition Plan: Pending CIR vs SNF  Consultants:   Neurology  Procedures:     Antimicrobials: Anti-infectives    None       Subjective: No complaints at this time  Objective: Vitals:   02/03/17 0148 02/03/17 0500 02/03/17 0531 02/03/17 0953  BP: (!) 166/89  (!) 162/91 133/71  Pulse: 89  98 90  Resp: 18  18 18   Temp: 98.3 F (36.8 C)  98.6 F (37 C) 99.2 F (37.3 C)  TempSrc: Oral  Oral Oral  SpO2: 95%  95% 97%  Weight:  69.2 kg (152 lb 9.6 oz)    Height:        Intake/Output Summary (Last 24 hours) at 02/03/17  1410 Last data filed at 02/03/17 0453  Gross per 24 hour  Intake           535.25 ml  Output             1350 ml  Net          -814.75 ml   Filed Weights   02/01/17 0500 02/02/17 0500 02/03/17 0500  Weight: 68.3 kg (150 lb 9.2 oz) 70.7 kg (155 lb 13.8 oz) 69.2 kg (152 lb 9.6 oz)    Examination:  General exam: Appears calm and comfortable  Respiratory system: Clear to auscultation. Respiratory effort normal. Cardiovascular system: S1 & S2 heard, RRR.  Gastrointestinal system: Abdomen is nondistended, soft and nontender. No organomegaly or masses felt. Normal bowel sounds heard. Central nervous system: Alert and oriented. R sided weakness, slurred speech Extremities: Symmetric 5 x 5 power. Skin: No rashes, lesions  Psychiatry: Judgement and insight appear normal. Mood & affect appropriate.   Data Reviewed: I have personally reviewed following labs and imaging studies  CBC:  Recent Labs Lab 01/31/17 0940 01/31/17 0949 02/01/17 0524 02/02/17 0243  WBC 6.1  --  6.2 6.9  NEUTROABS 4.0  --   --   --   HGB 15.0 15.6 15.9 15.0  HCT 46.1 46.0 46.9 44.6  MCV 92.0  --  91.1 90.3  PLT 233  --  235 231   Basic Metabolic Panel:  Recent Labs Lab 01/31/17 0940 01/31/17 0949 01/31/17 1627 02/01/17 0524 02/02/17 0243 02/03/17 0356  NA 142 143  --  142 140 141  K 3.9 4.3  --  3.2* 3.5 3.8  CL 110 108  --  106 109 107  CO2 23  --   --  26 24 26   GLUCOSE 90 88  --  87 118* 110*  BUN 21* 30*  --  17 12 8   CREATININE 0.98 0.80  --  0.69 0.77 0.71  CALCIUM 8.7*  --   --  8.5* 8.4* 8.9  MG 2.2  --  2.4 2.1  --   --   PHOS 3.3  --  3.5 3.4  --   --    GFR: Estimated Creatinine Clearance: 73.4 mL/min (by C-G formula based on SCr of 0.71 mg/dL). Liver Function Tests:  Recent Labs Lab 01/31/17 0940  AST 34  ALT 14*  ALKPHOS 44  BILITOT 1.9*  PROT 6.5  ALBUMIN 3.9   No results for input(s): LIPASE, AMYLASE in the last 168 hours. No results for input(s): AMMONIA in the  last 168 hours. Coagulation Profile:  Recent Labs Lab 01/31/17 0940  INR 1.09   Cardiac Enzymes: No results for input(s): CKTOTAL, CKMB, CKMBINDEX, TROPONINI in the last 168 hours. BNP (last 3 results) No results for input(s): PROBNP in the last 8760 hours. HbA1C:  Recent Labs  02/01/17 0202  HGBA1C 5.3   CBG:  Recent Labs Lab 02/01/17 2310 02/02/17 0312 02/02/17 0742 02/02/17 1138 02/02/17 1517  GLUCAP 238* 105* 98 134* 133*   Lipid Profile:  Recent Labs  02/01/17 0524  CHOL 112  HDL 43  LDLCALC 56  TRIG 67  CHOLHDL 2.6   Thyroid Function Tests: No results for input(s): TSH, T4TOTAL, FREET4, T3FREE, THYROIDAB in the last 72 hours. Anemia Panel: No results for input(s): VITAMINB12, FOLATE, FERRITIN, TIBC, IRON, RETICCTPCT in the last 72 hours. Sepsis Labs: No results for input(s): PROCALCITON, LATICACIDVEN in the last 168 hours.  Recent Results (from the past 240 hour(s))  MRSA PCR Screening     Status: None   Collection Time: 01/31/17  5:23 PM  Result Value Ref Range Status   MRSA by PCR NEGATIVE NEGATIVE Final    Comment:        The GeneXpert MRSA Assay (FDA approved for NASAL specimens only), is one component of a comprehensive MRSA colonization surveillance program. It is not intended to diagnose MRSA infection nor to guide or monitor treatment for MRSA infections.      Radiology Studies: No results found.  Scheduled Meds: .  stroke: mapping our early stages of recovery book   Does not apply Once  . allopurinol  100 mg Oral Daily  . aspirin  81 mg Oral Daily  . clopidogrel  75 mg Oral Daily  . heparin subcutaneous  5,000 Units Subcutaneous Q8H  . mouth rinse  15 mL Mouth Rinse BID  . simvastatin  40 mg Oral QPM   Continuous Infusions:   LOS: 3 days   Avel Ogawa, Scheryl MartenSTEPHEN K, MD [facility] Pager [phone]  If 7PM-7AM, please contact night-coverage [email or web address] Password [email or web address] 02/03/2017, 2:10 PM

## 2017-02-03 NOTE — Progress Notes (Signed)
Inpatient Rehabilitation  I have initiated insurance authorization and plan to follow up with the patient later today.  Please call with questions.  Abby Stines, M.A., CCC/SLP Admission Coordinator  [facility]  Cell [phone]   

## 2017-02-03 NOTE — Progress Notes (Signed)
Physical Therapy Treatment Patient Details Name: Brandon Howard MRN: [id number] DOB: 10-14-1940 Today's Date: 02/03/2017    History of Present Illness 76 y.o.malepast medical history significant for hypertension and stroke. Presents emergency room with right-sided weakness and slurred speech. 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 Progressing well, but continues to require increased physical assist, CIR remains appropriate.   Follow Up Recommendations  CIR     Equipment Recommendations  Other (comment) (TBA if no CIR)    Recommendations for Other Services Rehab consult     Precautions / Restrictions Precautions Precautions: Fall Restrictions Weight Bearing Restrictions: No    Mobility  Bed Mobility Overal bed mobility: Needs Assistance Bed Mobility: Supine to Sit     Supine to sit: Min assist     General bed mobility comments: min assist to rotate trunk to EOB  Transfers Overall transfer level: Needs assistance Equipment used: Rolling walker (2 wheeled) Transfers: Sit to/from Stand Sit to Stand: Mod assist         General transfer comment: Manual and verbal cues for hand placement and positioning  Ambulation/Gait Ambulation/Gait assistance: Mod assist;+2 physical assistance Ambulation Distance (Feet): 70 Feet Assistive device: Rolling walker (2 wheeled) Gait Pattern/deviations: Step-to pattern;Decreased step length - right;Decreased dorsiflexion - right;Steppage;Ataxic;Narrow base of support Gait velocity: Decreased Gait velocity interpretation: Below normal speed for age/gender General Gait Details: Multi modal cues for gait retraining, manual weight  shift and pacing facilitation required (2 standing rest breaks)   Stairs            Wheelchair Mobility    Modified Rankin (Stroke Patients Only) Modified Rankin (Stroke Patients Only) Pre-Morbid Rankin Score: No symptoms Modified Rankin: Moderately severe disability     Balance Overall balance assessment: Needs assistance Sitting-balance support: Single extremity supported;Feet unsupported Sitting balance-Leahy Scale: Poor     Standing balance support: Bilateral upper extremity supported Standing balance-Leahy Scale: Poor Standing balance comment: posterior lean                            Cognition Arousal/Alertness: Awake/alert Behavior During Therapy: Flat affect Overall Cognitive Status: Impaired/Different from baseline Area of Impairment: Safety/judgement                         Safety/Judgement: Decreased awareness of safety            Exercises      General Comments        Pertinent Vitals/Pain Pain Assessment: Faces Faces Pain Scale: Hurts little more Pain Location: did not state Pain Descriptors / Indicators: Grimacing Pain Intervention(s): Monitored during session    Home Living                      Prior Function            PT Goals (current goals can now be found in the care plan section) Acute Rehab PT Goals Patient Stated Goal: to return home with his son's care PT Goal Formulation: With patient Time For Goal Achievement: 02/15/17 Potential to Achieve Goals: Good Progress towards PT goals: Progressing toward goals    Frequency  Min 4X/week      PT Plan Current plan remains appropriate    Co-evaluation              AM-PAC PT "6 Clicks" Daily Activity  Outcome Measure  Difficulty turning over in bed (including adjusting bedclothes, sheets and blankets)?: A Lot Difficulty moving from lying on back to sitting on the side of the bed? : A Lot Difficulty sitting down on and standing up  from a chair with arms (e.g., wheelchair, bedside commode, etc,.)?: A Lot Help needed moving to and from a bed to chair (including a wheelchair)?: A Lot Help needed walking in hospital room?: Total Help needed climbing 3-5 steps with a railing? : Total 6 Click Score: 10    End of Session Equipment Utilized During Treatment: Gait belt Activity Tolerance: Patient tolerated treatment well Patient left: in chair;with call bell/phone within reach;with chair alarm set Nurse Communication: Mobility status PT Visit Diagnosis: Ataxic gait (R26.0);Hemiplegia and hemiparesis Hemiplegia - Right/Left: Right Hemiplegia - dominant/non-dominant: Dominant Hemiplegia - caused by: Cerebral infarction     Time: [id number] PT Time Calculation (min) (ACUTE ONLY): 18 min  Charges:  $Gait Training: 8-22 mins                    G Codes:       Charlotte Crumbevon Abner Ardis, PT DPT  Board Certified Neurologic Specialist [phone]    Fabio AsaDevon J Attikus Bartoszek 02/03/2017, 2:10 PM

## 2017-02-03 NOTE — Progress Notes (Signed)
Inpatient Rehabilitation  Met with patient to discuss team's recommendation for IP Rehab.  Shared booklets and answered questions.  I obtained patient's permission to also call his son, Jeneen Rinks and discuss it with him since the rehab MD anticipates patient will require 24/7 Supervision post IP Rehab stay.  Spoke with Jeneen Rinks about the rehab process and he plans to discuss it with his dad tonight.  Plan to continue to follow for timing of medical readiness, insurance authorization, and patient/family decision.  Please call with questions.   Carmelia Roller., CCC/SLP Admission Coordinator  [facility]  Cell [phone]

## 2017-02-03 NOTE — Clinical Social Work Note (Signed)
Clinical Social Work Assessment  Patient Details  Name: Brandon HolterHorace C Hubbs MRN: [id number] Date of Birth: [birth year]  Date of referral:  02/03/17               Reason for consult:  Facility Placement, Discharge Planning                Permission sought to share information with:  Facility Medical sales representativeContact Representative, Family Supports Permission granted to share information::  Yes, Verbal Permission Granted  Name::     Teacher, adult educationJames  Agency::  SNF  Relationship::  Son  SolicitorContact Information:     Housing/Transportation Living arrangements for the past 2 months:  Apartment Source of Information:  Patient Patient Interpreter Needed:  None Criminal Activity/Legal Involvement Pertinent to Current Situation/Hospitalization:  No - Comment as needed Significant Relationships:  Adult Children Lives with:  Self Do you feel safe going back to the place where you live?  Yes Need for family participation in patient care:  No (Coment)  Care giving concerns:  Patient currently lives at home alone but needs increased support and short term rehab at this time prior to returning home in order to successfully complete ADLs.   Social Worker assessment / plan:  CSW introduced self to patient and explained current discharge plan of admitting to CIR, pending insurance approval. CSW explained how sometimes insurance denies CIR placement, and another option would be SNF placement for rehab instead. Patient nodded understanding in being agreeable to pursue SNF as a back-up option. Patient attempted to ask questions about insurance approval, and CSW attempted to answer questions; patient nodded his head in response and said "thank you" after answers given, but questions were difficult to understand given patient's current communication difficulties. CSW completed referral and faxed out; will follow for CIR approval, and contact son to determine SNF placement and initiate authorization as back-up, if needed.  Employment status:   Retired Database administratornsurance information:  Managed Medicare PT Recommendations:  Inpatient Rehab Consult Information / Referral to community resources:  Skilled Nursing Facility  Patient/Family's Response to care:  Patient agreeable to pursue SNF as back-up to CIR admission.  Patient/Family's Understanding of and Emotional Response to Diagnosis, Current Treatment, and Prognosis:  Patient indicated understanding of information by nodding his head. Patient attempted to ask questions and engage in discussion, but was having difficulty expressing what he wanted due to expressive aphagia. Patient seems to understand the need for rehab prior to returning home.  Emotional Assessment Appearance:  Appears stated age Attitude/Demeanor/Rapport:    Affect (typically observed):  Appropriate Orientation:  Oriented to Situation, Oriented to  Time, Oriented to Place, Oriented to Self Alcohol / Substance use:  Not Applicable Psych involvement (Current and /or in the community):  No (Comment)  Discharge Needs  Concerns to be addressed:  Care Coordination, Discharge Planning Concerns Readmission within the last 30 days:  No Current discharge risk:  Lives alone, Physical Impairment Barriers to Discharge:  Continued Medical Work up, Insurance Authorization   Baldemar Lenislizabeth M Aveyah [place], [place]CS[place] 02/03/2017, 12:11 PM

## 2017-02-04 ENCOUNTER — Encounter (HOSPITAL_COMMUNITY): Payer: Self-pay

## 2017-02-04 ENCOUNTER — Inpatient Hospital Stay (HOSPITAL_COMMUNITY)
Admission: RE | Admit: 2017-02-04 | Discharge: 2017-02-26 | DRG: 057 | Disposition: A | Discharge status: Skilled Nursing Facility | Payer: Medicare HMO | Source: Intra-hospital | Attending: Physical Medicine & Rehabilitation | Admitting: Physical Medicine & Rehabilitation

## 2017-02-04 DIAGNOSIS — I6930 Unspecified sequelae of cerebral infarction: Secondary | ICD-10-CM | POA: Diagnosis not present

## 2017-02-04 DIAGNOSIS — Z7902 Long term (current) use of antithrombotics/antiplatelets: Secondary | ICD-10-CM

## 2017-02-04 DIAGNOSIS — Z951 Presence of aortocoronary bypass graft: Secondary | ICD-10-CM | POA: Diagnosis not present

## 2017-02-04 DIAGNOSIS — I69393 Ataxia following cerebral infarction: Secondary | ICD-10-CM

## 2017-02-04 DIAGNOSIS — E785 Hyperlipidemia, unspecified: Secondary | ICD-10-CM

## 2017-02-04 DIAGNOSIS — I2510 Atherosclerotic heart disease of native coronary artery without angina pectoris: Secondary | ICD-10-CM | POA: Diagnosis present

## 2017-02-04 DIAGNOSIS — I10 Essential (primary) hypertension: Secondary | ICD-10-CM | POA: Diagnosis present

## 2017-02-04 DIAGNOSIS — I69322 Dysarthria following cerebral infarction: Secondary | ICD-10-CM | POA: Diagnosis present

## 2017-02-04 DIAGNOSIS — F1490 Cocaine use, unspecified, uncomplicated: Secondary | ICD-10-CM | POA: Diagnosis present

## 2017-02-04 DIAGNOSIS — Z79899 Other long term (current) drug therapy: Secondary | ICD-10-CM

## 2017-02-04 DIAGNOSIS — F1910 Other psychoactive substance abuse, uncomplicated: Secondary | ICD-10-CM

## 2017-02-04 DIAGNOSIS — I63219 Cerebral infarction due to unspecified occlusion or stenosis of unspecified vertebral arteries: Secondary | ICD-10-CM | POA: Diagnosis present

## 2017-02-04 DIAGNOSIS — G8191 Hemiplegia, unspecified affecting right dominant side: Secondary | ICD-10-CM | POA: Diagnosis not present

## 2017-02-04 DIAGNOSIS — I69351 Hemiplegia and hemiparesis following cerebral infarction affecting right dominant side: Secondary | ICD-10-CM | POA: Diagnosis not present

## 2017-02-04 DIAGNOSIS — I69391 Dysphagia following cerebral infarction: Secondary | ICD-10-CM

## 2017-02-04 DIAGNOSIS — I25810 Atherosclerosis of coronary artery bypass graft(s) without angina pectoris: Secondary | ICD-10-CM

## 2017-02-04 DIAGNOSIS — I69320 Aphasia following cerebral infarction: Secondary | ICD-10-CM

## 2017-02-04 DIAGNOSIS — R1312 Dysphagia, oropharyngeal phase: Secondary | ICD-10-CM | POA: Diagnosis present

## 2017-02-04 DIAGNOSIS — R269 Unspecified abnormalities of gait and mobility: Secondary | ICD-10-CM

## 2017-02-04 DIAGNOSIS — I63213 Cerebral infarction due to unspecified occlusion or stenosis of bilateral vertebral arteries: Secondary | ICD-10-CM

## 2017-02-04 DIAGNOSIS — I69398 Other sequelae of cerebral infarction: Secondary | ICD-10-CM | POA: Diagnosis not present

## 2017-02-04 DIAGNOSIS — Z87891 Personal history of nicotine dependence: Secondary | ICD-10-CM

## 2017-02-04 DIAGNOSIS — I69321 Dysphasia following cerebral infarction: Secondary | ICD-10-CM

## 2017-02-04 DIAGNOSIS — I6322 Cerebral infarction due to unspecified occlusion or stenosis of basilar arteries: Secondary | ICD-10-CM | POA: Diagnosis not present

## 2017-02-04 LAB — GLUCOSE, CAPILLARY: Glucose-Capillary: 150 mg/dL — ABNORMAL HIGH (ref 65–99)

## 2017-02-04 MED ORDER — DIPHENHYDRAMINE HCL 12.5 MG/5ML PO ELIX: 12.5000 mg | ORAL_SOLUTION | Freq: Four times a day (QID) | ORAL | Status: DC | PRN

## 2017-02-04 MED ORDER — ALLOPURINOL 100 MG PO TABS
100.0000 mg | ORAL_TABLET | Freq: Every day | ORAL | Status: DC
  Administered 2017-02-05 – 2017-02-26 (×22): 100 mg via ORAL
  Filled 2017-02-04 (×22): qty 1

## 2017-02-04 MED ORDER — ENOXAPARIN SODIUM 40 MG/0.4ML ~~LOC~~ SOLN
40.0000 mg | SUBCUTANEOUS | Status: DC
  Administered 2017-02-04 – 2017-02-25 (×22): 40 mg via SUBCUTANEOUS
  Filled 2017-02-04 (×22): qty 0.4

## 2017-02-04 MED ORDER — TRAZODONE HCL 50 MG PO TABS
25.0000 mg | ORAL_TABLET | Freq: Every evening | ORAL | Status: DC | PRN
  Administered 2017-02-13: 50 mg via ORAL
  Administered 2017-02-13: 25 mg via ORAL
  Administered 2017-02-14 – 2017-02-24 (×8): 50 mg via ORAL
  Filled 2017-02-04 (×10): qty 1

## 2017-02-04 MED ORDER — AMLODIPINE BESYLATE 10 MG PO TABS
10.0000 mg | ORAL_TABLET | Freq: Every day | ORAL | Status: DC
  Administered 2017-02-05 – 2017-02-25 (×19): 10 mg via ORAL
  Filled 2017-02-04 (×22): qty 1

## 2017-02-04 MED ORDER — CLOPIDOGREL BISULFATE 75 MG PO TABS
75.0000 mg | ORAL_TABLET | Freq: Every day | ORAL | Status: DC
  Administered 2017-02-05 – 2017-02-26 (×22): 75 mg via ORAL
  Filled 2017-02-04 (×22): qty 1

## 2017-02-04 MED ORDER — BISACODYL 10 MG RE SUPP: 10.0000 mg | Freq: Every day | RECTAL | Status: DC | PRN

## 2017-02-04 MED ORDER — ACETAMINOPHEN 325 MG PO TABS
325.0000 mg | ORAL_TABLET | ORAL | Status: DC | PRN
  Administered 2017-02-13: 325 mg via ORAL
  Administered 2017-02-14 – 2017-02-15 (×2): 650 mg via ORAL
  Filled 2017-02-04 (×3): qty 2

## 2017-02-04 MED ORDER — PROCHLORPERAZINE MALEATE 5 MG PO TABS: 5.0000 mg | ORAL_TABLET | Freq: Four times a day (QID) | ORAL | Status: DC | PRN

## 2017-02-04 MED ORDER — FLEET ENEMA 7-19 GM/118ML RE ENEM: 1.0000 | ENEMA | Freq: Once | RECTAL | Status: DC | PRN

## 2017-02-04 MED ORDER — POLYETHYLENE GLYCOL 3350 17 G PO PACK
17.0000 g | PACK | Freq: Every day | ORAL | Status: DC | PRN
  Filled 2017-02-04: qty 1

## 2017-02-04 MED ORDER — ALUM & MAG HYDROXIDE-SIMETH 200-200-20 MG/5ML PO SUSP: 30.0000 mL | ORAL | Status: DC | PRN

## 2017-02-04 MED ORDER — ASPIRIN 81 MG PO CHEW
81.0000 mg | CHEWABLE_TABLET | Freq: Every day | ORAL | Status: DC
  Administered 2017-02-05 – 2017-02-26 (×22): 81 mg via ORAL
  Filled 2017-02-04 (×22): qty 1

## 2017-02-04 MED ORDER — LISINOPRIL 20 MG PO TABS
20.0000 mg | ORAL_TABLET | Freq: Every day | ORAL | Status: DC
  Administered 2017-02-05 – 2017-02-22 (×18): 20 mg via ORAL
  Filled 2017-02-04 (×18): qty 1

## 2017-02-04 MED ORDER — PROCHLORPERAZINE EDISYLATE 5 MG/ML IJ SOLN: 5.0000 mg | Freq: Four times a day (QID) | INTRAMUSCULAR | Status: DC | PRN

## 2017-02-04 MED ORDER — SIMVASTATIN 40 MG PO TABS
40.0000 mg | ORAL_TABLET | Freq: Every evening | ORAL | Status: DC
  Administered 2017-02-04 – 2017-02-25 (×22): 40 mg via ORAL
  Filled 2017-02-04 (×22): qty 1

## 2017-02-04 MED ORDER — PROCHLORPERAZINE 25 MG RE SUPP: 12.5000 mg | Freq: Four times a day (QID) | RECTAL | Status: DC | PRN

## 2017-02-04 MED ORDER — GUAIFENESIN-DM 100-10 MG/5ML PO SYRP: 5.0000 mL | ORAL_SOLUTION | Freq: Four times a day (QID) | ORAL | Status: DC | PRN

## 2017-02-04 MED ORDER — ASPIRIN 81 MG PO CHEW: 81.0000 mg | CHEWABLE_TABLET | Freq: Every day | ORAL | Status: DC

## 2017-02-04 NOTE — PMR Pre-admission (Signed)
PMR Admission Coordinator Pre-Admission Assessment  Patient: Brandon Howard is an 76 y.o., male MRN: [id number] DOB: [birth year] Height: 5\' 7"  (170.2 cm) Weight: 69.2 kg (152 lb 9.6 oz)              Insurance Information HMO: X    PPO:      PCP:      IPA:      80/20:      OTHER:  PRIMARY: [employer]       Policy#: [id number]      Subscriber: Self  CM Name: Lyman Bishop      Phone#: [phone] [phone]     Fax#: [phone] Pre-Cert#: [id number]      Employer: Retired  Benefits:  Phone #: [phone]     Name: Verified online and with Willette Pa. Date: 06/30/16     Deduct: $0      Out of Pocket Max: $5900      Life Max: N/A CIR: $295 a day, days 1-6; $0 a day for days 7+      SNF: $0 a day. Days 1-20; $167 a days days 21-100 Outpatient: $40 per visit outpatient hospital, $101 per visit with specialist office visit      Home Health: 100%      Co-Pay: none DME: 80%     Co-Pay: 20% Providers: In-network   SECONDARY: None     Medicaid Application Date:       Case Manager:  Disability Application Date:       Case Worker:   Emergency Contact Information Contact Information    Name Relation Home Work [place] Son   [phone]   Eda Keys [phone]       Current Medical History  Patient Admitting Diagnosis: Bilateral CVA  History of Present Illness: Brandon Howard is a 76 y.o. 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 History taken from chart review. CT head with concerns of left hemipons acute/subacute infarct and encephalomalacia right cerebellar hemisphere. 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  MRI reviewed, showing bilateral  infarcts. Per report and MRA brain, acute infarcts in left > right pons, bilateral cerebellum and right thalamus, critical proximal BS stenosis with question thrombus or irregular plaque. 2D echo with EF 55- 60% and no wall abnormality. 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 IV heparin X one week followed by repeat CTA to monitor for resolution of vasospasms. Moderate to severe dysarthria noted with dysphagia and patient placed on dysphagia 3 textures and nectar-thick liquids. PT evaluation done revealing deficits in mobility with RLE weakness and CIR recommended for follow up therapy. Patient admitted to IP Rehab 02/04/17.   NIH Total: 8    Past Medical History  Past Medical History:  Diagnosis Date  . Gout   . Hepatitis   . Hypertension   . Pancreatitis   . Right knee pain   . Stroke (HCC)   . Vitamin B 12 deficiency     Family History  family history includes COPD in his father; Throat cancer in his mother.  Prior Rehab/Hospitalizations:  Has the patient had major surgery during 100 days prior to  admission? No  Current Medications   Current Facility-Administered Medications:  .   stroke: mapping our early stages of recovery book, , Does not apply, Once, Haydee Salter, MD .  acetaminophen (TYLENOL) tablet 650 mg, 650 mg, Oral, Q4H PRN **OR** [DISCONTINUED] acetaminophen (TYLENOL) solution 650 mg, 650 mg, Per Tube, Q4H PRN **OR** acetaminophen (TYLENOL) suppository 650 mg, 650 mg, Rectal, Q4H PRN, Haydee Salter, MD .  allopurinol (ZYLOPRIM) tablet 100 mg, 100 mg, Oral, Daily, [name], MD, 100 mg at 02/04/17 0913 .  aspirin chewable tablet 81 mg, 81 mg, Oral, Daily, Patteson, Samuel A, NP, 81 mg at 02/04/17 0913 .  clopidogrel (PLAVIX) tablet 75 mg, 75 mg, Oral, Daily, Patteson, Samuel A, NP, 75 mg at 02/04/17 0912 .  heparin injection 5,000 Units, 5,000 Units, Subcutaneous, Q8H, [name], MD, 5,000  Units at 02/04/17 1333 .  hydrALAZINE (APRESOLINE) injection 10 mg, 10 mg, Intravenous, Q8H PRN, Bruna Potter H, PA-C .  MEDLINE mouth rinse, 15 mL, Mouth Rinse, BID, [name], MD, 15 mL at 02/04/17 0913 .  RESOURCE THICKENUP CLEAR, , Oral, PRN, [name], MD .  senna-docusate (Senokot-S) tablet 1 tablet, 1 tablet, Oral, QHS PRN, Haydee Salter, MD .  simvastatin (ZOCOR) tablet 40 mg, 40 mg, Oral, QPM, [name], MD, 40 mg at 02/03/17 1742  Patients Current Diet: DIET DYS 3 Room service appropriate? Yes; Fluid consistency: Nectar Thick  Precautions / Restrictions Precautions Precautions: Fall Restrictions Weight Bearing Restrictions: No   Has the patient had 2 or more falls or a fall with injury in the past year?No, 1 fall without injury   Prior Activity Level Limited Community (1-2x/wk): Prior to admission patient lived in an apartment in [facility] where he had noon meals prepared for him.  He was independent with use of his walker in his apartment and went out about once a week to eat or to go to medical appointments with his son driving.   Home Assistive Devices / Equipment Home Assistive Devices/Equipment: Gilmer Mor (specify quad or straight)  Prior Device Use: Indicate devices/aids used by the patient prior to current illness, exacerbation or injury? Walker  Prior Functional Level Prior Function Level of Independence: Independent with assistive device(s) Comments: walks with a cane, does not drive, independent in ADL and IADL  Self Care: Did the patient need help bathing, dressing, using the toilet or eating? Independent  Indoor Mobility: Did the patient need assistance with walking from room to room (with or without device)? Independent  Stairs: Did the patient need assistance with internal or external stairs (with or without device)? Independent  Functional Cognition: Did the patient need help planning regular tasks such as shopping or  remembering to take medications? Independent  Current Functional Level Cognition  Arousal/Alertness: Awake/alert Overall Cognitive Status: Impaired/Different from baseline Difficult to assess due to: Impaired communication Orientation Level: Oriented X4 Safety/Judgement: Decreased awareness of safety Attention: Sustained Sustained Attention: Impaired Sustained Attention Impairment: Functional basic Memory: Impaired Memory Impairment: Decreased recall of new information (3/3 immediate recall  1/3 delayed recall) Awareness: Appears intact Problem Solving: Impaired Problem Solving Impairment: Verbal basic Safety/Judgment: Appears intact    Extremity Assessment (includes Sensation/Coordination)  Upper Extremity Assessment: RUE deficits/detail RUE Deficits / Details: 4-/5 gross grasp, 3+/5 elbow, 2/5 shoulder, pain with shoulder ROM above 90 degrees, + subluxation RUE Coordination: decreased fine motor, decreased gross motor  Lower Extremity Assessment: Defer to PT evaluation RLE Deficits / Details: hip flexion 3/5,  abduction 3+, knee extension 3-/5; ankle DF 2+/5    ADLs  Overall ADL's : Needs assistance/impaired Eating/Feeding: Set up, Sitting Grooming: Wash/dry hands, Wash/dry face, Sitting, Minimal assistance, Oral care Upper Body Bathing: Moderate assistance Lower Body Bathing: Sit to/from stand, Maximal assistance Upper Body Dressing : Moderate assistance, Sitting Lower Body Dressing: Maximal assistance, Sit to/from stand Toilet Transfer: Moderate assistance, Stand-pivot, BSC Toileting- Clothing Manipulation and Hygiene: Maximal assistance, Sit to/from stand General ADL Comments: Pt reports he could lift his R arm and use it as a functional assist prior to this event.    Mobility  Overal bed mobility: Needs Assistance Bed Mobility: Supine to Sit Supine to sit: Min assist Sit to supine: Min assist General bed mobility comments: min assist to rotate trunk to EOB     Transfers  Overall transfer level: Needs assistance Equipment used: Rolling walker (2 wheeled) Transfers: Sit to/from Stand Sit to Stand: Mod assist Stand pivot transfers: Mod assist General transfer comment: Manual and verbal cues for hand placement and positioning    Ambulation / Gait / Stairs / Wheelchair Mobility  Ambulation/Gait Ambulation/Gait assistance: Mod assist, +2 physical assistance Ambulation Distance (Feet): 70 Feet Assistive device: Rolling walker (2 wheeled) Gait Pattern/deviations: Step-to pattern, Decreased step length - right, Decreased dorsiflexion - right, Steppage, Ataxic, Narrow base of support General Gait Details: Multi modal cues for gait retraining, manual weight shift and pacing facilitation required (2 standing rest breaks) Gait velocity: Decreased Gait velocity interpretation: Below normal speed for age/gender    Posture / Balance Balance Overall balance assessment: Needs assistance Sitting-balance support: Single extremity supported, Feet unsupported Sitting balance-Leahy Scale: Poor Standing balance support: Bilateral upper extremity supported Standing balance-Leahy Scale: Poor Standing balance comment: posterior lean    Special needs/care consideration BiPAP/CPAP: No CPM: No Continuous Drip IV: No Dialysis: No         Life Vest: No Oxygen: No Special Bed: No Trach Size: No Wound Vac (area): No       Skin: Abrasion to the right foot and knee; Bruising right back, hip, and leg; Moisture associated skin damage to medial portion of buttocks                                 Bowel mgmt: Continent 02/01/17 Bladder mgmt: Catheter present  Diabetic mgmt: HgbA1c - 5.3     Previous Home Environment Living Arrangements: Alone  Lives With: Alone Home Care Services: No Additional Comments: pt difficult to understand, therefore full history not obtained  Discharge Living Setting Plans for Discharge Living Setting: Patient's home, Other (Comment) (son  to be there as recommended 24/7  ) Type of Home at Discharge: Apartment Discharge Home Layout: One level Discharge Home Access: Level entry Discharge Bathroom Shower/Tub: Tub/shower unit Discharge Bathroom Toilet: Standard Discharge Bathroom Accessibility: Yes How Accessible: Accessible via walker Does the patient have any problems obtaining your medications?: No  Social/Family/Support Systems Patient Roles: Parent Contact Information: Son: Nelson ChimesJames Lacerda [phone]; Sister: Roxanna MewMargret Bennet [phone] Anticipated Caregiver: Janet BerlinSon James  Anticipated Caregiver's Contact Information: see above Ability/Limitations of Caregiver: he works, but has Designer, industrial/productflexibility  Caregiver Availability: 24/7 Discharge Plan Discussed with Primary Caregiver: Yes Is Caregiver In Agreement with Plan?: Yes Does Caregiver/Family have Issues with Lodging/Transportation while Pt is in Rehab?: No  Goals/Additional Needs Patient/Family Goal for Rehab: PT/OT Supervision-Min A; SLP Mod I-Supervision  Expected length of stay: 18-23 days  Cultural Considerations: Baptist  Dietary Needs: Dys.3  textures and Nectar-thick liquids  Equipment Needs: TBD Special Service Needs: None Additional Information: 4 children total: 1 Fayrene Fearing is local, 2 out of town, 1 son died about a month ago due to a motor cycle accident  Pt/Family Agrees to Admission and willing to participate: Yes Program Orientation Provided & Reviewed with Pt/Caregiver Including Roles  & Responsibilities: Yes Additional Information Needs: None Information Needs to be Provided By: N/A  Decrease burden of Care through IP rehab admission: No  Possible need for SNF placement upon discharge: Potentially  Patient Condition: This patient's medical and functional status has changed since the consult dated: 02/02/17 at 1341 in which the Rehabilitation Physician determined and documented that the patient's condition is appropriate for intensive rehabilitative care in an  inpatient rehabilitation facility. See "History of Present Illness" (above) for medical update. Functional changes are: Mod A +2 70 feet. Patient's medical and functional status update has been discussed with the Rehabilitation physician and patient remains appropriate for inpatient rehabilitation. Will admit to inpatient rehab today.  Preadmission Screen Completed By:  Fae Pippin, 02/04/2017 1:36 PM ______________________________________________________________________   Discussed status with Dr. Allena Katz on 02/04/17 at 1335 and received telephone approval for admission today.  Admission Coordinator:  Fae Pippin, time 1335/Date 02/04/17

## 2017-02-04 NOTE — Progress Notes (Signed)
Inpatient Rehabilitation  I have received insurance authorization, medical clearance, and patient/family are in agreement with plan.  Plan to proceed with IP Rehab admission today.  Please call with questions.    Charlane FerrettiMelissa Zuleima Haser, M.A., CCC/SLP Admission Coordinator  [facility]  Cell [phone]

## 2017-02-04 NOTE — Progress Notes (Signed)
Patient arrived on unit approximately 1555. Oriented to room/unit. No acute distress at this time.

## 2017-02-04 NOTE — H&P (Signed)
Physical Medicine and Rehabilitation Admission H&P    CC: Right sided weakness and difficulty speaking.    HPI: Brandon Howard is a 76 y.o. 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 History taken from chart review. CT head with concerns of left hemipons acute/subacute infarct and encephalomalacia right cerebellar hemisphere. 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  MRI reviewed, showing bilateral infarcts. Per report and MRA brain, acute infarcts in left > right pons, bilateral cerebellum and right thalamus, critical proximal BS stenosis with question thrombus or irregular plaque. 2D echo with EF 55- 60% and no wall abnormality. 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 Moderate to severe dysarthria noted with dysphagia and patient placed on dysphagia 3, nectar liquids.   He was started on IV heparin for treatment of vasospasms and  transitioned to oral ASA+ Plavix on 8/6  As follow up CT head showed no significant changes. Dr. Leonie Man recommended monitoring for ETOH withdrawal as well as counseling to stop frequent cocaine use.   Therapy ongoing and patient with significant dysarthria and dysphagia as well as decline in mobility and ability to carry out ADL tasks. CIR recommended by MD and rehab team.   Review of Systems  HENT: Negative for hearing loss and tinnitus.   Eyes: Negative for blurred vision (left lateral field?) and double vision.  Respiratory: Negative for cough and shortness of breath.   Cardiovascular: Negative for chest pain and palpitations.  Gastrointestinal: Negative for heartburn and nausea.    Genitourinary: Negative for dysuria and urgency.  Musculoskeletal: Negative for back pain and myalgias.  Skin: Negative for rash.  Neurological: Positive for speech change, focal weakness, weakness and headaches. Negative for dizziness.  Psychiatric/Behavioral: The patient is not nervous/anxious and does not have insomnia.   All other systems reviewed and are negative.     Past Medical History:  Diagnosis Date  . Gout   . Hepatitis   . Hypertension   . Pancreatitis   . Right knee pain   . Stroke ([place])   . Vitamin B 12 deficiency     Past Surgical History:  Procedure Laterality Date  . CHOLECYSTECTOMY    . CORONARY ARTERY BYPASS GRAFT  2005    Family History  Problem Relation Age of Onset  . Throat cancer Mother   . COPD Father     Social History:  Lives alone in at [employer] [facility]. He indicated that he was independent PTA and has assistance occasionally with housework.  He  reports that he has never smoked. He used to chew tobacco. He reports that he drinks alcohol occasionally. He uses cocaine occasionally.     Allergies: No Known Allergies    Medications Prior to Admission  Medication Sig Dispense Refill  . allopurinol (ZYLOPRIM) 100 MG tablet Take 100 mg by mouth daily.  0  . amLODipine (NORVASC) 10 MG tablet Take 10 mg by mouth daily.  0  . [START ON 02/05/2017] aspirin 81 MG chewable tablet Chew 1 tablet (81 mg total) by mouth daily.    . clopidogrel (PLAVIX) 75 MG tablet Take 75 mg by mouth daily.  0  . lisinopril (PRINIVIL,ZESTRIL)  20 MG tablet Take 20 mg by mouth daily.  0  . simvastatin (ZOCOR) 40 MG tablet Take 40 mg by mouth every evening.  0    Home: Lives alone in an apartment   Functional History:    Functional Status:  Mobility:          ADL:    Cognition:      : Blood pressure (!) 150/74, pulse 80, temperature 98.2 F (36.8 C), temperature source Oral, resp. rate 19, weight 69.4 kg (153 lb 1.6 oz), SpO2 95 %. Physical Exam   Nursing note and vitals reviewed. Constitutional: He is oriented to person, place, and time. He appears well-developed and well-nourished.  HENT:  Head: Normocephalic and atraumatic.  Mouth/Throat: Oropharynx is clear and moist.  Eyes: Pupils are equal, round, and reactive to light. Conjunctivae and EOM are normal.  Neck: Normal range of motion. Neck supple.  Cardiovascular: Normal rate and regular rhythm.   Murmur heard. Respiratory: Effort normal and breath sounds normal. No stridor. No respiratory distress. He has no wheezes.  GI: Soft. Bowel sounds are normal.  Musculoskeletal: He exhibits no edema or tenderness.  Neurological: He is alert and oriented to person, place, and time.  Right facial droop with moderate to severe dysarthria.  Able to follow simple one and two step motor commands. Motor: RUE: 3/5 proximal to distal LLE/LUE: 5/5 proximal to distal RLE: 4/5 proximal to distal Sensation intact to light touch DTRs symmetric Right spastic hemiparesis.   Sensation intact to light touch Right spastic hemiparesis.    Skin: Skin is warm and dry.  Psychiatric: He has a normal mood and affect. His behavior is normal.      Results for orders placed or performed during the hospital encounter of 01/31/17 (from the past 48 hour(s))  Basic metabolic panel     Status: Abnormal   Collection Time: 02/03/17  3:56 AM  Result Value Ref Range   Sodium 141 135 - 145 mmol/L   Potassium 3.8 3.5 - 5.1 mmol/L   Chloride 107 101 - 111 mmol/L   CO2 26 22 - 32 mmol/L   Glucose, Bld 110 (H) 65 - 99 mg/dL   BUN 8 6 - 20 mg/dL   Creatinine, Ser 7.07 0.61 - 1.24 mg/dL   Calcium 8.9 8.9 - 61.5 mg/dL   GFR calc non Af Amer >60 >60 mL/min   GFR calc Af Amer >60 >60 mL/min    Comment: (NOTE) The eGFR has been calculated using the CKD EPI equation. This calculation has not been validated in all clinical situations. eGFR's persistently <60 mL/min signify possible Chronic Kidney Disease.     Anion gap 8 5 - 15  Glucose, capillary     Status: Abnormal   Collection Time: 02/03/17  9:33 PM  Result Value Ref Range   Glucose-Capillary 190 (H) 65 - 99 mg/dL  Glucose, capillary     Status: Abnormal   Collection Time: 02/04/17  4:53 AM  Result Value Ref Range   Glucose-Capillary 150 (H) 65 - 99 mg/dL   Comment 1 Notify RN    Comment 2 Document in Chart    No results found.   Medical Problem List and Plan: 1.  Dysarthria and dysphagia as well as decline in mobility and ability to carry out ADL tasks. secondary to bilateral CVA. 2.  DVT Prophylaxis/Anticoagulation: Pharmaceutical: Lovenox 3. Pain Management: N/A 4. Mood: LCSW to follow for evaluation and support.  5. Neuropsych: This patient is not fully capable  of making decisions on his own behalf. 6. Skin/Wound Care: routine pressure relief measures.  7. Fluids/Electrolytes/Nutrition: Monitor I/O. Check lytes in am as on nectar liquids. Monitor for signs of dehydration.  8. HTN: Permissive HTN for 5-7 days--Meds resumed today prior to admission. Monitor for hypotension  in setting of proximal BA stenosis.  9. Hyperlipidemia: On Zocor.  10. Polysubstance abuse: Counsel on importance of cessation.  11. CAD s/p CABG: Asymptomatic. On ASA, Plavix and Zocor.   Post Admission Physician Evaluation: 1. Preadmission assessment reviewed and changes made below. 2. Functional deficits secondary  to bilateral CVA. 3. Patient is admitted to receive collaborative, interdisciplinary care between the physiatrist, rehab nursing staff, and therapy team. 4. Patient's level of medical complexity and substantial therapy needs in context of that medical necessity cannot be provided at a lesser intensity of care such as a SNF. 5. Patient has experienced substantial functional loss from his/her baseline which was documented above under the "Functional History" and "Functional Status" headings.  Judging by the patient's diagnosis, physical exam, and  functional history, the patient has potential for functional progress which will result in measurable gains while on inpatient rehab.  These gains will be of substantial and practical use upon discharge  in facilitating mobility and self-care at the household level. 37. Physiatrist will provide 24 hour management of medical needs as well as oversight of the therapy plan/treatment and provide guidance as appropriate regarding the interaction of the two. 7. 24 hour rehab nursing will assist with bladder management, safety, disease management, medication administration and patient education  and help integrate therapy concepts, techniques,education, etc. 8. PT will assess and treat for/with: Lower extremity strength, range of motion, stamina, balance, functional mobility, safety, adaptive techniques and equipment, coping skills, pain control, stroke education.   Goals are: Supervision. 9. OT will assess and treat for/with: ADL's, functional mobility, safety, upper extremity strength, adaptive techniques and equipment, ego support, and community reintegration.   Goals are: Supervision. Therapy may proceed with showering this patient. 10. SLP will assess and treat for/with: swallowing, speech, cognition.  Goals are: Supervision. 11. Case Management and Social Worker will assess and treat for psychological issues and discharge planning. 12. Team conference will be held weekly to assess progress toward goals and to determine barriers to discharge. 13. Patient will receive at least 3 hours of therapy per day at least 5 days per week. 14. ELOS: 15-19 days.       15. Prognosis:  good  Delice Lesch, MD, [address], [place] 02/04/2017

## 2017-02-04 NOTE — Care Management Note (Signed)
Case Management Note  Patient Details  Name: Brandon Howard MRN: [id number] Date of Birth: Jul 15, 1940  Subjective/Objective:                    Action/Plan: Pt discharging to CIR today. No further needs per CM.   Expected Discharge Date:                  Expected Discharge Plan:  IP Rehab Facility  In-House Referral:     Discharge planning Services  CM Consult  Post Acute Care Choice:    Choice offered to:     DME Arranged:    DME Agency:     HH Arranged:    HH Agency:     Status of Service:  Completed, signed off  If discussed at [employer] Length of Stay Meetings, dates discussed:    Additional Comments:  Kermit BaloKelli F Kyrin Garn, RN 02/04/2017, 1:32 PM

## 2017-02-04 NOTE — Discharge Summary (Signed)
Physician Discharge Summary  Brandon Howard ZOX:[id number] DOB: 1941/05/02 DOA: 01/31/2017  PCP: [name], MD  Admit date: 01/31/2017 Discharge date: 02/04/2017  Admitted From: Home Disposition: CIR  Recommendations for Outpatient Follow-up:  1. Follow up with PCP in 1 week 2. Follow up with neurology in 6 weeks 3. Speech therapy 4. Please follow up on the following pending results: None  Home Health: CIR Equipment/Devices: CIR  Discharge Condition: Stable CODE STATUS: Full code Diet recommendation:  Dysphagia 3 (mechanical soft);Nectar-thick liquid Liquids provided via: Cup;No straw Medication Administration: Crushed with puree Supervision: Patient able to self feed;Full supervision/cueing for compensatory strategies Compensations: Slow rate;Small sips/bites;Lingual sweep for clearance of pocketing Postural Changes and/or Swallow Maneuvers: Seated upright 90 degrees  Brief/Interim Summary:  Admission HPI written by Haydee Salter, MD   Chief Complaint: Right-sided weakness  HPI: Brandon Howard is a 76 y.o. male  past medical history significant for hypertension and stroke. Presents emergency room with right-sided weakness and slurred speech. Patient states symptoms started yesterday. Did not call for assistance until today. Difficult walking. EMS activated. Brought patient to the hospital for evaluation. Patient symptoms have not worsened or improved. [name] snot been taking asa at home.  ED course: CT of the head revealed new stroke. Hospitalist consulted for admission.    Hospital course:  Acute ischemic stroke Infarcts and bilateral pons, bilateral cerebellum, and right thalamus secondary to basilar stenosis seen on MRI and MRA. CTA significant for severe posterior circulation atherosclerosis with moderate to severe bilateral distal vertebral artery stenosis and critical stenosis/short segment occlusion of proximal basilar artery in addition to  widespread/occasionally bulky plaque in bilateral carotid arteries. Patient's simvastatin was continued with an LDL of 56. Hemoglobin A1C of 5.3. Patient will be treated with dual antiplatelet therapy and will need follow-up with neurology in 6 weeks.  Essential hypertension Amlodipine and lisinopril held on admission for permissive hypertension. Restarted on discharge.  Hyperlipidemia Continued home simvastatin  Cocaine abuse Cocaine positive. Cessation discussed.  Hypokalemia Resolved with supplementation  CAD S/p CABG in 2005. Asymptomatic.  History of EtOH abuse No evidence of withdrawal on admission.  Discharge Diagnoses:  Active Problems:   Stroke (cerebrum) (HCC)   Dysphagia, post-stroke   History of CVA with residual deficit   Hyperglycemia   Benign essential HTN   Dysarthria, post-stroke   Cocaine abuse    Discharge Instructions   Allergies as of 02/04/2017   No Known Allergies     Medication List    TAKE these medications   allopurinol 100 MG tablet Commonly known as:  ZYLOPRIM Take 100 mg by mouth daily.   amLODipine 10 MG tablet Commonly known as:  NORVASC Take 10 mg by mouth daily.   aspirin 81 MG chewable tablet Chew 1 tablet (81 mg total) by mouth daily.   clopidogrel 75 MG tablet Commonly known as:  PLAVIX Take 75 mg by mouth daily.   lisinopril 20 MG tablet Commonly known as:  PRINIVIL,ZESTRIL Take 20 mg by mouth daily.   simvastatin 40 MG tablet Commonly known as:  ZOCOR Take 40 mg by mouth every evening.       No Known Allergies  Consultations:  Neurology   Procedures/Studies: Ct Angio Head W Or Wo Contrast  Result Date: 01/31/2017 CLINICAL DATA:  77 year old male with right side weakness and slurred speech for several days. Acute posterior circulation infarcts with very severe basilar artery stenosis on MRI and MRA today. EXAM: CT ANGIOGRAPHY HEAD AND NECK TECHNIQUE: Multidetector CT  imaging of the head and neck was  performed using the standard protocol during bolus administration of intravenous contrast. Multiplanar CT image reconstructions and MIPs were obtained to evaluate the vascular anatomy. Carotid stenosis measurements (when applicable) are obtained utilizing NASCET criteria, using the distal internal carotid diameter as the denominator. CONTRAST:  50 mL Isovue 370 COMPARISON:  Brain MRI, head and neck MRA 1206 hours today, and earlier FINDINGS: CTA NECK Skeleton: Prior sternotomy. Superior cervical spine degeneration including multilevel spondylolisthesis, and chronic C4-C5 ankylosis. Partially visible thoracic spine degeneration. No acute osseous abnormality identified. Visualized paranasal sinuses and mastoids are stable and well pneumatized. Upper chest: Partially visible cardiomegaly and prior CABG. No superior mediastinal lymphadenopathy. Dependent pulmonary atelectasis. Other neck: Negative.  No cervical lymphadenopathy. Aortic arch: Mild to moderate aortic arch soft and calcified plaque. Three vessel arch configuration. Right carotid system: No brachiocephalic or right CCA origin stenosis. Soft plaque along the anterior wall of the mid right CCA without stenosis. Bulky calcified plaque at the right carotid bifurcation with soft and calcified plaque extending into the right ICA origin and bulb, but less than 50 % stenosis with respect to the distal vessel (series 9, image 74). Left carotid system: No left CCA origin stenosis despite soft and calcified plaque. Tortuous left CCA, with a kinked appearance at the anterior left thoracic inlet (series 5, image 139). Mild anterior wall soft and calcified plaque proximal to the left carotid bifurcation with no associated stenosis. Bulky calcified plaque at the left ICA origin and bulb, but stenosis is less than 50 % with respect to the distal vessel. Superimposed soft plaque in the distal left ICA bulb beyond which there is tortuosity of the vessel with a mildly kinked  appearance. Soft and calcified plaque of the distal left ICA just below the skullbase without stenosis. Vertebral arteries: No proximal right subclavian artery stenosis despite plaque. Calcified plaque at the right vertebral artery origin with mild to moderate stenosis (series 8, image 140). No other right vertebral artery stenosis to the skullbase. No proximal left subclavian artery stenosis despite plaque. Calcified plaque near the left vertebral artery origin, but no origin stenosis (series 8, image 138). Tortuous left P1 segment with soft plaque but no stenosis. Occasional left V2 segment calcified plaque. No left vertebral artery stenosis to the skullbase. CTA HEAD Posterior circulation: Fairly extensive bilateral vertebral artery V4 segment plaque and stenosis, worse on the left. Moderate multifocal left V4 and mild to moderate right V4 segment stenoses. Still, the PICA origins and vertebrobasilar junction remain patent. However, there is short segment occlusion of the proximal basilar artery about 5 mm distal to the vertebrobasilar junction (series 11, images 25 and 26). Flow is reconstituted in the distal basilar which is diminutive and irregular. The SCA origins remain patent. There is a fetal left PCA origin. The right P1 segment is patent but irregular, with moderate to severe stenosis. Right posterior communicating artery is diminutive or absent. Left PCA branches are within normal limits, but there is poor flow in the right PCA throughout its course. Anterior circulation: Both ICA siphons are patent with extensive calcified plaque. On the left there is mild to moderate precavernous and cavernous segment stenosis, with moderate left supraclinoid stenosis. Normal left ophthalmic and posterior communicating artery origins. On the right there is mild cavernous segment stenosis. Normal right ophthalmic artery origin. Patent carotid termini. Normal MCA and ACA origins. Anterior communicating artery and  bilateral ACA branches are normal. Left MCA M1 segment, bifurcation, and proximal  left MCA branches are within normal limits. Mild irregularity of some left M3 branches. Right MCA M1 segment, bifurcation, and right MCA branches are within normal limits. Venous sinuses: Patent. Anatomic variants: Fetal left PCA origin. Delayed phase: No abnormal enhancement identified. Stable gray-white matter differentiation throughout the brain. no intracranial mass effect. Review of the MIP images confirms the above findings IMPRESSION: 1. Severe posterior circulation atherosclerosis. 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 Reconstituted flow in the distal Basilar, but proximal right PCA stenosis and poor flow throughout the right PCA. 2. Widespread and occasionally bulky plaque in both carotid arteries including the ICA siphons, but no hemodynamically significant carotid stenosis in the neck. There is up to moderate stenosis of the left ICA siphon. 3. Otherwise negative anterior circulation. 4. Stable CT appearance of the brain since 1015 hours today. Electronically Signed   By: Odessa Fleming M.D.   On: 01/31/2017 15:37   Ct Head Wo Contrast  Result Date: 01/31/2017 CLINICAL DATA:  76 year old male with right facial droop, slurred speech and right-sided weakness. Prior right cerebellar stroke in 2010. EXAM: CT HEAD WITHOUT CONTRAST TECHNIQUE: Contiguous axial images were obtained from the base of the skull through the vertex without intravenous contrast. COMPARISON:  Prior CT scan of the head 10/28/2008 FINDINGS: Brain: Encephalomalacia of the right cerebellar hemisphere consistent with prior right PCA territory infarct. Focus of hypoattenuation in the right corona radiata (image 16 series 3) is new compared to prior. Hypoattenuation in the left pons is also new compared to prior and favored to represent an acute/subacute infarct.  Generalized cerebral and cerebellar volume loss. Mild chronic microvascular ischemic white matter disease. Mild ex vacuo ventriculomegaly. No evidence of intracranial hemorrhage. Vascular: Atherosclerotic calcifications in the bilateral cavernous carotid arteries. No hyperdense vessel sign. Skull: Normal. Negative for fracture or focal lesion. Sinuses/Orbits: No acute finding. Other: None. IMPRESSION: 1. Positive for ill-defined hypoattenuation in the left hemi pons concerning for an acute/subacute infarct. 2. Less specific hypoattenuation in the right corona radiata is new compared to 2010 and likely represents a region of ischemic insult which has occurred sometime in the interim. This may be chronic, or subacute. 3. Sequelae of prior right PCA territory infarct with resultant encephalomalacia. 4. Intracranial atherosclerosis. These results were called by telephone at the time of interpretation on 01/31/2017 at 10:28 am to Dr. Margarita Grizzle , who verbally acknowledged these results. Electronically Signed   By: Malachy Moan M.D.   On: 01/31/2017 10:30   Ct Angio Neck W Or Wo Contrast  Result Date: 01/31/2017 CLINICAL DATA:  76 year old male with right side weakness and slurred speech for several days. Acute posterior circulation infarcts with very severe basilar artery stenosis on MRI and MRA today. EXAM: CT ANGIOGRAPHY HEAD AND NECK TECHNIQUE: Multidetector CT imaging of the head and neck was performed using the standard protocol during bolus administration of intravenous contrast. Multiplanar CT image reconstructions and MIPs were obtained to evaluate the vascular anatomy. Carotid stenosis measurements (when applicable) are obtained utilizing NASCET criteria, using the distal internal carotid diameter as the denominator. CONTRAST:  50 mL Isovue 370 COMPARISON:  Brain MRI, head and neck MRA 1206 hours today, and earlier FINDINGS: CTA NECK Skeleton: Prior sternotomy. Superior cervical spine degeneration  including multilevel spondylolisthesis, and chronic C4-C5 ankylosis. Partially visible thoracic spine degeneration. No acute osseous abnormality identified. Visualized paranasal sinuses and mastoids are stable and well pneumatized. Upper chest: Partially visible  cardiomegaly and prior CABG. No superior mediastinal lymphadenopathy. Dependent pulmonary atelectasis. Other neck: Negative.  No cervical lymphadenopathy. Aortic arch: Mild to moderate aortic arch soft and calcified plaque. Three vessel arch configuration. Right carotid system: No brachiocephalic or right CCA origin stenosis. Soft plaque along the anterior wall of the mid right CCA without stenosis. Bulky calcified plaque at the right carotid bifurcation with soft and calcified plaque extending into the right ICA origin and bulb, but less than 50 % stenosis with respect to the distal vessel (series 9, image 74). Left carotid system: No left CCA origin stenosis despite soft and calcified plaque. Tortuous left CCA, with a kinked appearance at the anterior left thoracic inlet (series 5, image 139). Mild anterior wall soft and calcified plaque proximal to the left carotid bifurcation with no associated stenosis. Bulky calcified plaque at the left ICA origin and bulb, but stenosis is less than 50 % with respect to the distal vessel. Superimposed soft plaque in the distal left ICA bulb beyond which there is tortuosity of the vessel with a mildly kinked appearance. Soft and calcified plaque of the distal left ICA just below the skullbase without stenosis. Vertebral arteries: No proximal right subclavian artery stenosis despite plaque. Calcified plaque at the right vertebral artery origin with mild to moderate stenosis (series 8, image 140). No other right vertebral artery stenosis to the skullbase. No proximal left subclavian artery stenosis despite plaque. Calcified plaque near the left vertebral artery origin, but no origin stenosis (series 8, image 138).  Tortuous left P1 segment with soft plaque but no stenosis. Occasional left V2 segment calcified plaque. No left vertebral artery stenosis to the skullbase. CTA HEAD Posterior circulation: Fairly extensive bilateral vertebral artery V4 segment plaque and stenosis, worse on the left. Moderate multifocal left V4 and mild to moderate right V4 segment stenoses. Still, the PICA origins and vertebrobasilar junction remain patent. However, there is short segment occlusion of the proximal basilar artery about 5 mm distal to the vertebrobasilar junction (series 11, images 25 and 26). Flow is reconstituted in the distal basilar which is diminutive and irregular. The SCA origins remain patent. There is a fetal left PCA origin. The right P1 segment is patent but irregular, with moderate to severe stenosis. Right posterior communicating artery is diminutive or absent. Left PCA branches are within normal limits, but there is poor flow in the right PCA throughout its course. Anterior circulation: Both ICA siphons are patent with extensive calcified plaque. On the left there is mild to moderate precavernous and cavernous segment stenosis, with moderate left supraclinoid stenosis. Normal left ophthalmic and posterior communicating artery origins. On the right there is mild cavernous segment stenosis. Normal right ophthalmic artery origin. Patent carotid termini. Normal MCA and ACA origins. Anterior communicating artery and bilateral ACA branches are normal. Left MCA M1 segment, bifurcation, and proximal left MCA branches are within normal limits. Mild irregularity of some left M3 branches. Right MCA M1 segment, bifurcation, and right MCA branches are within normal limits. Venous sinuses: Patent. Anatomic variants: Fetal left PCA origin. Delayed phase: No abnormal enhancement identified. Stable gray-white matter differentiation throughout the brain. no intracranial mass effect. Review of the MIP images confirms the above findings  IMPRESSION: 1. Severe posterior circulation atherosclerosis. 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 Reconstituted flow in the distal Basilar, but proximal right PCA stenosis and poor flow throughout the right PCA. 2. Widespread and occasionally bulky  plaque in both carotid arteries including the ICA siphons, but no hemodynamically significant carotid stenosis in the neck. There is up to moderate stenosis of the left ICA siphon. 3. Otherwise negative anterior circulation. 4. Stable CT appearance of the brain since 1015 hours today. Electronically Signed   By: Odessa Fleming M.D.   On: 01/31/2017 15:37   Mr Maxine Glenn Neck Wo Contrast  Result Date: 01/31/2017 CLINICAL DATA:  Right-sided weakness and slurred speech. EXAM: MRI HEAD WITHOUT CONTRAST MRA HEAD WITHOUT CONTRAST MRA NECK WITHOUT CONTRAST TECHNIQUE: Multiplanar, multiecho pulse sequences of the brain and surrounding structures were obtained without intravenous contrast. Angiographic images of the Circle of Willis were obtained using MRA technique without intravenous contrast. Angiographic images of the neck were obtained using MRA technique without intravenous contrast. Carotid stenosis measurements (when applicable) are obtained utilizing NASCET criteria, using the distal internal carotid diameter as the denominator. COMPARISON:  10/29/2008 brain MRI.  Head CT from earlier today FINDINGS: MRI HEAD FINDINGS Brain: Confluent acute infarct in the left paramedian pons and to a lesser degree in the right paramedian pons. Clustered small acute infarcts in the bilateral peripheral cerebellum. Acute lacunar infarct in the right thalamus. No acute noted anterior circulation infarcts. There is extensive remote right cerebellar infarction, mainly affecting the upper hemisphere and peduncles. Chronic microvascular ischemia in the cerebral white matter, confluent around the  lateral ventricles. No acute hemorrhage, hydrocephalus, or masslike findings. Single remote microhemorrhage in the central right cerebellum. Remote lacunar infarct in the left genu of corpus callosum. Vascular: There is a blurred appearance of the mid basilar on T1 and T2 weighted imaging. See below. Skull and upper cervical spine: Cervical facet arthropathy. Negative for marrow lesion. Sinuses/Orbits: Negative MRA HEAD FINDINGS There are symmetric carotid arteries. Fetal type left PCA. Mild anterior circulation atheromatous changes. Symmetric atheromatous vertebral arteries. In the proximal and mid basilar is critical stenosis with 2 flow gaps. The abnormality has a central luminal appearance concerning for thrombus or irregular/ulcerated plaque. There is no right posterior communicating artery. Diminutive flow seen in the distal basilar and in the regular right PCA. Normal flow in the left PCA which is served by posterior communicating artery. No evidence of intramural hematoma on conventional brain MRI to suggest intracranial dissection. MRA NECK FINDINGS Limited noncontrast neck MRA. There is atherosclerosis of the bifurcations with at least moderate (50%) left ICA stenosis at the bulb. The visible aorta arch is normal caliber. There is antegrade flow in both vertebral arteries. The right vertebral artery ostium was not covered. Critical Value/emergent results were called by telephone at the time of interpretation on 01/31/2017 at 1:33 pm to Dr. Aneta Mins HOBBS, who verbally acknowledged these results. IMPRESSION: 1. 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 The narrowing is irregular and fairly long and could be thrombus and or irregular plaque. Diminished flow in the distal basilar and irregular right PCA. Left PCA is fetal type. 2. Acute infarcts in the left more than right pons, bilateral cerebellum, and right thalamus. 3. Remote infarcts in the upper right cerebellum and left corpus callosum genu. 4.  Patent carotid and vertebral arteries in the neck. 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 Electronically Signed   By: Marnee Spring M.D.   On: 01/31/2017 13:42   Mr Brain Wo Contrast  Result Date: 01/31/2017 CLINICAL DATA:  Right-sided weakness and slurred speech. EXAM: MRI HEAD WITHOUT CONTRAST MRA HEAD WITHOUT CONTRAST MRA NECK WITHOUT CONTRAST TECHNIQUE: Multiplanar, multiecho pulse sequences of the brain  and surrounding structures were obtained without intravenous contrast. Angiographic images of the Circle of Willis were obtained using MRA technique without intravenous contrast. Angiographic images of the neck were obtained using MRA technique without intravenous contrast. Carotid stenosis measurements (when applicable) are obtained utilizing NASCET criteria, using the distal internal carotid diameter as the denominator. COMPARISON:  10/29/2008 brain MRI.  Head CT from earlier today FINDINGS: MRI HEAD FINDINGS Brain: Confluent acute infarct in the left paramedian pons and to a lesser degree in the right paramedian pons. Clustered small acute infarcts in the bilateral peripheral cerebellum. Acute lacunar infarct in the right thalamus. No acute noted anterior circulation infarcts. There is extensive remote right cerebellar infarction, mainly affecting the upper hemisphere and peduncles. Chronic microvascular ischemia in the cerebral white matter, confluent around the lateral ventricles. No acute hemorrhage, hydrocephalus, or masslike findings. Single remote microhemorrhage in the central right cerebellum. Remote lacunar infarct in the left genu of corpus callosum. Vascular: There is a blurred appearance of the mid basilar on T1 and T2 weighted imaging. See below. Skull and upper cervical spine: Cervical facet arthropathy. Negative for marrow lesion. Sinuses/Orbits: Negative MRA HEAD FINDINGS There are symmetric carotid arteries. Fetal type left PCA. Mild anterior circulation atheromatous changes.  Symmetric atheromatous vertebral arteries. In the proximal and mid basilar is critical stenosis with 2 flow gaps. The abnormality has a central luminal appearance concerning for thrombus or irregular/ulcerated plaque. There is no right posterior communicating artery. Diminutive flow seen in the distal basilar and in the regular right PCA. Normal flow in the left PCA which is served by posterior communicating artery. No evidence of intramural hematoma on conventional brain MRI to suggest intracranial dissection. MRA NECK FINDINGS Limited noncontrast neck MRA. There is atherosclerosis of the bifurcations with at least moderate (50%) left ICA stenosis at the bulb. The visible aorta arch is normal caliber. There is antegrade flow in both vertebral arteries. The right vertebral artery ostium was not covered. Critical Value/emergent results were called by telephone at the time of interpretation on 01/31/2017 at 1:33 pm to Dr. Aneta Mins HOBBS, who verbally acknowledged these results. IMPRESSION: 1. 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 The narrowing is irregular and fairly long and could be thrombus and or irregular plaque. Diminished flow in the distal basilar and irregular right PCA. Left PCA is fetal type. 2. Acute infarcts in the left more than right pons, bilateral cerebellum, and right thalamus. 3. Remote infarcts in the upper right cerebellum and left corpus callosum genu. 4. Patent carotid and vertebral arteries in the neck. 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 Electronically Signed   By: Marnee Spring M.D.   On: 01/31/2017 13:42   Dg Swallowing Func-speech Pathology  Result Date: 02/01/2017 Objective Swallowing Evaluation: Type of Study: MBS-Modified Barium Swallow Study Patient Details Name: Brandon Howard MRN: [id number] Date of Birth: 1940-08-02 Today's Date: 02/01/2017 Time: SLP Start Time (ACUTE ONLY): 1045-SLP Stop Time (ACUTE ONLY): 1109 SLP Time Calculation (min) (ACUTE ONLY): 24 min  Past Medical History: Past Medical History: Diagnosis Date . Hypertension  . Stroke [facility])  Past Surgical History: No past surgical history on file. HPI: Brandon Howard a 76 y.o.malepast medical history significant for hypertension and stroke. Presents emergency room with right-sided weakness and slurred speech. Patient states symptoms started yesterday. Did not call for assistance until today. Difficult walking. EMS activated. Brought patient to the hospital for evaluation. Patient symptoms have not worsened or improved. ED course: CT of the head  revealed new stroke. MRI of head showinginfracrts left worse then right pons, bilateral cerebellum and right thalamus as well as remote infarcts in right cerebellum and left corpus collosum. Subjective: The patient was seen in radiology for MBS to determine current swallowing physiology.  Assessment / Plan / Recommendation CHL IP CLINICAL IMPRESSIONS 02/01/2017 Clinical Impression MBS was completed using thin liquids, nectar thick liquids, pureed material and dual textured solids.  The patient presented with oropharyngeal dysphagia.  The oral phase was characterized by adequate but delayed mastication of dual textured solids, oral residue given pureed material and dual textured solids and premature loss of the bolus seen across most textures.  In addition, decreased labial seal leads to anterior escape mostly given thin liquids.  The pharyngeal phase of the swallow was characterized by a swallow trigger in the pyriform sinuses given thin and nectar thick liquids, a swallow trigger in the vallecula given pureed material and dual textured solids, mildly decreased laryngeal vestibule closure and mild pyriform residue seen after the swallow which appeared to be secondary to premature closure of the UES.  Flash penetration was noted during the swallow given thin liquids via spoon sips.  Penetration into the laryngeal vestibule was seen during the swallowing given cup  sips of thin liquids which the patient was unable to clear with a cued second swallow nor a cough and reswallow.  One episode of flash penetration was seen given spoon sips of nectar thick liquids during the swallow but was not replicated even give self fed cup sips.  Esophageal sweep did not reveal overt issues.  Recommend a dysphagia 3 diet with nectar thick liquids.  The patient should take 1 sip at a time and not use straws.  Given the oral residue and oral weakness there is potential concern for oral residue given solids.  Instructed nursing to downgrade his diet to dysphagia 1 if issues are noted with pocketing/oral residue.  ST will follow during acute stay.  The patient would also benefit from ST follow up at the next level of care.   SLP Visit Diagnosis Dysphagia, oropharyngeal phase (R13.12) Attention and concentration deficit following -- Frontal lobe and executive function deficit following -- Impact on safety and function Mild aspiration risk   CHL IP TREATMENT RECOMMENDATION 02/01/2017 Treatment Recommendations Therapy as outlined in treatment plan below   Prognosis 02/01/2017 Prognosis for Safe Diet Advancement Fair Barriers to Reach Goals -- Barriers/Prognosis Comment -- CHL IP DIET RECOMMENDATION 02/01/2017 SLP Diet Recommendations Dysphagia 3 (Mech soft) solids;Nectar thick liquid Liquid Administration via Cup Medication Administration Crushed with puree Compensations Slow rate;Small sips/bites Postural Changes Remain semi-upright after after feeds/meals (Comment);Seated upright at 90 degrees   CHL IP OTHER RECOMMENDATIONS 02/01/2017 Recommended Consults -- Oral Care Recommendations Oral care before and after PO;Oral care BID Other Recommendations Order thickener from pharmacy;Prohibited food (jello, ice cream, thin soups)   CHL IP FOLLOW UP RECOMMENDATIONS 02/01/2017 Follow up Recommendations Other (comment)   CHL IP FREQUENCY AND DURATION 02/01/2017 Speech Therapy Frequency (ACUTE ONLY) min 2x/week Treatment  Duration 2 weeks      CHL IP ORAL PHASE 02/01/2017 Oral Phase Impaired Oral - Pudding Teaspoon -- Oral - Pudding Cup -- Oral - Honey Teaspoon -- Oral - Honey Cup -- Oral - Nectar Teaspoon -- Oral - Nectar Cup -- Oral - Nectar Straw -- Oral - Thin Teaspoon Right anterior bolus loss Oral - Thin Cup Right anterior bolus loss Oral - Thin Straw -- Oral - Puree Delayed oral transit;Premature spillage Oral -  Mech Soft -- Oral - Regular -- Oral - Multi-Consistency Delayed oral transit;Premature spillage Oral - Pill -- Oral Phase - Comment --  CHL IP PHARYNGEAL PHASE 02/01/2017 Pharyngeal Phase Impaired Pharyngeal- Pudding Teaspoon -- Pharyngeal -- Pharyngeal- Pudding Cup -- Pharyngeal -- Pharyngeal- Honey Teaspoon -- Pharyngeal -- Pharyngeal- Honey Cup -- Pharyngeal -- Pharyngeal- Nectar Teaspoon Delayed swallow initiation-pyriform sinuses;Penetration/Aspiration during swallow Pharyngeal Material enters airway, remains ABOVE vocal cords then ejected out Pharyngeal- Nectar Cup Delayed swallow initiation-pyriform sinuses;Pharyngeal residue - pyriform Pharyngeal -- Pharyngeal- Nectar Straw -- Pharyngeal -- Pharyngeal- Thin Teaspoon Delayed swallow initiation-pyriform sinuses;Penetration/Aspiration during swallow Pharyngeal Material enters airway, remains ABOVE vocal cords then ejected out Pharyngeal- Thin Cup Delayed swallow initiation-pyriform sinuses;Penetration/Aspiration during swallow Pharyngeal Material enters airway, remains ABOVE vocal cords and not ejected out Pharyngeal- Thin Straw -- Pharyngeal -- Pharyngeal- Puree Delayed swallow initiation-vallecula;Pharyngeal residue - pyriform Pharyngeal -- Pharyngeal- Mechanical Soft -- Pharyngeal -- Pharyngeal- Regular -- Pharyngeal -- Pharyngeal- Multi-consistency Delayed swallow initiation-vallecula;Pharyngeal residue - pyriform Pharyngeal -- Pharyngeal- Pill -- Pharyngeal -- Pharyngeal Comment --  CHL IP CERVICAL ESOPHAGEAL PHASE 02/01/2017 Cervical Esophageal Phase WFL Pudding  Teaspoon -- Pudding Cup -- Honey Teaspoon -- Honey Cup -- Nectar Teaspoon -- Nectar Cup -- Nectar Straw -- Thin Teaspoon -- Thin Cup -- Thin Straw -- Puree -- Mechanical Soft -- Regular -- Multi-consistency -- Pill -- Cervical Esophageal Comment -- No flowsheet data found. Dimas Aguas, MA, CCC-SLP Acute Rehab SLP [phone] [name] 02/01/2017, 11:24 AM              Mr Maxine Glenn Head Wo Contrast  Result Date: 01/31/2017 CLINICAL DATA:  Right-sided weakness and slurred speech. EXAM: MRI HEAD WITHOUT CONTRAST MRA HEAD WITHOUT CONTRAST MRA NECK WITHOUT CONTRAST TECHNIQUE: Multiplanar, multiecho pulse sequences of the brain and surrounding structures were obtained without intravenous contrast. Angiographic images of the Circle of Willis were obtained using MRA technique without intravenous contrast. Angiographic images of the neck were obtained using MRA technique without intravenous contrast. Carotid stenosis measurements (when applicable) are obtained utilizing NASCET criteria, using the distal internal carotid diameter as the denominator. COMPARISON:  10/29/2008 brain MRI.  Head CT from earlier today FINDINGS: MRI HEAD FINDINGS Brain: Confluent acute infarct in the left paramedian pons and to a lesser degree in the right paramedian pons. Clustered small acute infarcts in the bilateral peripheral cerebellum. Acute lacunar infarct in the right thalamus. No acute noted anterior circulation infarcts. There is extensive remote right cerebellar infarction, mainly affecting the upper hemisphere and peduncles. Chronic microvascular ischemia in the cerebral white matter, confluent around the lateral ventricles. No acute hemorrhage, hydrocephalus, or masslike findings. Single remote microhemorrhage in the central right cerebellum. Remote lacunar infarct in the left genu of corpus callosum. Vascular: There is a blurred appearance of the mid basilar on T1 and T2 weighted imaging. See below. Skull and upper cervical spine:  Cervical facet arthropathy. Negative for marrow lesion. Sinuses/Orbits: Negative MRA HEAD FINDINGS There are symmetric carotid arteries. Fetal type left PCA. Mild anterior circulation atheromatous changes. Symmetric atheromatous vertebral arteries. In the proximal and mid basilar is critical stenosis with 2 flow gaps. The abnormality has a central luminal appearance concerning for thrombus or irregular/ulcerated plaque. There is no right posterior communicating artery. Diminutive flow seen in the distal basilar and in the regular right PCA. Normal flow in the left PCA which is served by posterior communicating artery. No evidence of intramural hematoma on conventional brain MRI to suggest intracranial dissection. MRA NECK FINDINGS Limited noncontrast  neck MRA. There is atherosclerosis of the bifurcations with at least moderate (50%) left ICA stenosis at the bulb. The visible aorta arch is normal caliber. There is antegrade flow in both vertebral arteries. The right vertebral artery ostium was not covered. Critical Value/emergent results were called by telephone at the time of interpretation on 01/31/2017 at 1:33 pm to Dr. Aneta Mins HOBBS, who verbally acknowledged these results. IMPRESSION: 1. 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 The narrowing is irregular and fairly long and could be thrombus and or irregular plaque. Diminished flow in the distal basilar and irregular right PCA. Left PCA is fetal type. 2. Acute infarcts in the left more than right pons, bilateral cerebellum, and right thalamus. 3. Remote infarcts in the upper right cerebellum and left corpus callosum genu. 4. Patent carotid and vertebral arteries in the neck. 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 Electronically Signed   By: Marnee Spring M.D.   On: 01/31/2017 13:42    Echocardiogram (02/01/17)  Study Conclusions  - Left ventricle: The cavity size was normal. Systolic function was   normal. The estimated ejection fraction  was in the range of 55%   to 60%. Wall motion was normal; there were no regional wall   motion abnormalities. Doppler parameters are consistent with   abnormal left ventricular relaxation (grade 1 diastolic   dysfunction).   Subjective: Dysarthria. Could not understand patient. Afebrile overnight.  Discharge Exam: Vitals:   02/04/17 0456 02/04/17 0930  BP: (!) 153/88 (!) 150/74  Pulse: 85 90  Resp: 20 20  Temp: 97.7 F (36.5 C) 97.6 F (36.4 C)   Vitals:   02/03/17 2046 02/04/17 0012 02/04/17 0456 02/04/17 0930  BP: 134/79 (!) 164/80 (!) 153/88 (!) 150/74  Pulse: 90 80 85 90  Resp: 20 20 20 20   Temp: 99.7 F (37.6 C) 98.2 F (36.8 C) 97.7 F (36.5 C) 97.6 F (36.4 C)  TempSrc: Oral Oral Oral Oral  SpO2: 98% 96% 95% 98%  Weight:      Height:        General: Pt is alert, awake, not in acute distress Cardiovascular: RRR, S1/S2 +, no rubs, no gallops Respiratory: CTA bilaterally, no wheezing, no rhonchi Abdominal: Soft, NT, ND, bowel sounds + Neuro: dysarthria, right sided weakness compared to left. Extremities: no edema, no cyanosis    The results of significant diagnostics from this hospitalization (including imaging, microbiology, ancillary and laboratory) are listed below for reference.     Microbiology: Recent Results (from the past 240 hour(s))  MRSA PCR Screening     Status: None   Collection Time: 01/31/17  5:23 PM  Result Value Ref Range Status   MRSA by PCR NEGATIVE NEGATIVE Final    Comment:        The GeneXpert MRSA Assay (FDA approved for NASAL specimens only), is one component of a comprehensive MRSA colonization surveillance program. It is not intended to diagnose MRSA infection nor to guide or monitor treatment for MRSA infections.      Labs: BNP (last 3 results) No results for input(s): BNP in the last 8760 hours. Basic Metabolic Panel:  Recent Labs Lab 01/31/17 0940 01/31/17 0949 01/31/17 1627 02/01/17 0524 02/02/17 0243  02/03/17 0356  NA 142 143  --  142 140 141  K 3.9 4.3  --  3.2* 3.5 3.8  CL 110 108  --  106 109 107  CO2 23  --   --  26 24 26   GLUCOSE  90 88  --  87 118* 110*  BUN 21* 30*  --  17 12 8   CREATININE 0.98 0.80  --  0.69 0.77 0.71  CALCIUM 8.7*  --   --  8.5* 8.4* 8.9  MG 2.2  --  2.4 2.1  --   --   PHOS 3.3  --  3.5 3.4  --   --    Liver Function Tests:  Recent Labs Lab 01/31/17 0940  AST 34  ALT 14*  ALKPHOS 44  BILITOT 1.9*  PROT 6.5  ALBUMIN 3.9   No results for input(s): LIPASE, AMYLASE in the last 168 hours. No results for input(s): AMMONIA in the last 168 hours. CBC:  Recent Labs Lab 01/31/17 0940 01/31/17 0949 02/01/17 0524 02/02/17 0243  WBC 6.1  --  6.2 6.9  NEUTROABS 4.0  --   --   --   HGB 15.0 15.6 15.9 15.0  HCT 46.1 46.0 46.9 44.6  MCV 92.0  --  91.1 90.3  PLT 233  --  235 231   Cardiac Enzymes: No results for input(s): CKTOTAL, CKMB, CKMBINDEX, TROPONINI in the last 168 hours. BNP: Invalid input(s): POCBNP CBG:  Recent Labs Lab 02/02/17 0742 02/02/17 1138 02/02/17 1517 02/03/17 2133 02/04/17 0453  GLUCAP 98 134* 133* 190* 150*   D-Dimer No results for input(s): DDIMER in the last 72 hours. Hgb A1c No results for input(s): HGBA1C in the last 72 hours. Lipid Profile No results for input(s): CHOL, HDL, LDLCALC, TRIG, CHOLHDL, LDLDIRECT in the last 72 hours. Thyroid function studies No results for input(s): TSH, T4TOTAL, T3FREE, THYROIDAB in the last 72 hours.  Invalid input(s): FREET3 Anemia work up No results for input(s): VITAMINB12, FOLATE, FERRITIN, TIBC, IRON, RETICCTPCT in the last 72 hours. Urinalysis    Component Value Date/Time   COLORURINE YELLOW 01/31/2017 1630   APPEARANCEUR CLEAR 01/31/2017 1630   LABSPEC 1.020 01/31/2017 1630   PHURINE 5.5 01/31/2017 1630   GLUCOSEU NEGATIVE 01/31/2017 1630   HGBUR NEGATIVE 01/31/2017 1630   BILIRUBINUR NEGATIVE 01/31/2017 1630   KETONESUR 40 (A) 01/31/2017 1630   PROTEINUR  NEGATIVE 01/31/2017 1630   UROBILINOGEN 0.2 10/26/2008 1550   NITRITE NEGATIVE 01/31/2017 1630   LEUKOCYTESUR NEGATIVE 01/31/2017 1630   Sepsis Labs Invalid input(s): PROCALCITONIN,  WBC,  LACTICIDVEN Microbiology Recent Results (from the past 240 hour(s))  MRSA PCR Screening     Status: None   Collection Time: 01/31/17  5:23 PM  Result Value Ref Range Status   MRSA by PCR NEGATIVE NEGATIVE Final    Comment:        The GeneXpert MRSA Assay (FDA approved for NASAL specimens only), is one component of a comprehensive MRSA colonization surveillance program. It is not intended to diagnose MRSA infection nor to guide or monitor treatment for MRSA infections.      Time coordinating discharge: Over 30 minutes  SIGNED:   Jacquelin Hawking, MD [facility] 02/04/2017, 2:13 PM Pager [phone]  If 7PM-7AM, please contact night-coverage [email or web address] Password [email or web address]

## 2017-02-04 NOTE — Progress Notes (Signed)
  Speech Language Pathology Treatment: Dysphagia;Cognitive-Linquistic  Patient Details Name: Brandon Howard MRN: [id number] DOB: [birth year] Today's Date: 02/04/2017 Time: [id number] SLP Time Calculation (min) (ACUTE ONLY): 16 min  Assessment / Plan / Recommendation Clinical Impression  Pt consumed Dys 3 textures and nectar thick liquids with wet vocal quality observed x2. SLP provided Mod cues for coughing/throat clearing with seemingly weak force produced by pt, although his voice did return to baseline. He appears to be tolerating his current diet as he remains afebrile with lung sounds unchanged and WBC WNL. SLP introduced effortful swallows to facilitate pharyngeal swallowing and glottal closure per MBS recommendations. Min cues were provided.   SLP also reinforced use of speech intelligibility strategies: over-articulation, pausing between words. SLP provided Max cues for use of strategies and for chunking/slower rate; however, pt still had a tendency to speak in full sentences and at a faster rate, making it difficult to interpret what he is saying. His intelligibility increases at the word to short phrase level. He will benefit from intensive SLP f/u upon d/c.   HPI HPI: Brandon ProvencalHorace C Underwoodis a 76 y.o.malepast medical history significant for hypertension and stroke. Presents emergency room with right-sided weakness and slurred speech. Patient states symptoms started yesterday. Did not call for assistance until today. Difficult walking. EMS activated. Brought patient to the hospital for evaluation. Patient symptoms have not worsened or improved. ED course: CT of the head revealed new stroke. MRI of head showinginfracrts left worse then right pons, bilateral cerebellum and right thalamus as well as remote infarcts in right cerebellum and left corpus collosum.      SLP Plan  Continue with current plan of care       Recommendations  Diet recommendations: Dysphagia 3 (mechanical  soft);Nectar-thick liquid Liquids provided via: Cup;No straw Medication Administration: Crushed with puree Supervision: Patient able to self feed;Full supervision/cueing for compensatory strategies Compensations: Slow rate;Small sips/bites;Lingual sweep for clearance of pocketing Postural Changes and/or Swallow Maneuvers: Seated upright 90 degrees                Oral Care Recommendations: Oral care BID Follow up Recommendations: Inpatient Rehab SLP Visit Diagnosis: Dysphagia, oropharyngeal phase (R13.12);Dysarthria and anarthria (R47.1) Plan: Continue with current plan of care       GO                Brandon Howard, Brandon Howard 02/04/2017, 11:09 AM  Brandon Howard, M.A. CCC-SLP [phone]

## 2017-02-05 DIAGNOSIS — I63219 Cerebral infarction due to unspecified occlusion or stenosis of unspecified vertebral arteries: Secondary | ICD-10-CM

## 2017-02-05 DIAGNOSIS — I6322 Cerebral infarction due to unspecified occlusion or stenosis of basilar arteries: Secondary | ICD-10-CM

## 2017-02-05 DIAGNOSIS — G8191 Hemiplegia, unspecified affecting right dominant side: Secondary | ICD-10-CM

## 2017-02-05 DIAGNOSIS — F1910 Other psychoactive substance abuse, uncomplicated: Secondary | ICD-10-CM

## 2017-02-05 LAB — CBC WITH DIFFERENTIAL/PLATELET
BASOS PCT: 0 %
Basophils Absolute: 0 10*3/uL (ref 0.0–0.1)
EOS ABS: 0.1 10*3/uL (ref 0.0–0.7)
Eosinophils Relative: 2 %
HCT: 49.7 % (ref 39.0–52.0)
Hemoglobin: 16.2 g/dL (ref 13.0–17.0)
Lymphocytes Relative: 33 %
Lymphs Abs: 2 10*3/uL (ref 0.7–4.0)
MCH: 30.6 pg (ref 26.0–34.0)
MCHC: 32.6 g/dL (ref 30.0–36.0)
MCV: 93.8 fL (ref 78.0–100.0)
MONO ABS: 0.7 10*3/uL (ref 0.1–1.0)
MONOS PCT: 11 %
NEUTROS PCT: 54 %
Neutro Abs: 3.3 10*3/uL (ref 1.7–7.7)
PLATELETS: 277 10*3/uL (ref 150–400)
RBC: 5.3 MIL/uL (ref 4.22–5.81)
RDW: 14.1 % (ref 11.5–15.5)
WBC: 6 10*3/uL (ref 4.0–10.5)

## 2017-02-05 LAB — COMPREHENSIVE METABOLIC PANEL
ALK PHOS: 52 U/L (ref 38–126)
ALT: 17 U/L (ref 17–63)
AST: 22 U/L (ref 15–41)
Albumin: 3.5 g/dL (ref 3.5–5.0)
Anion gap: 11 (ref 5–15)
BUN: 16 mg/dL (ref 6–20)
CALCIUM: 9.1 mg/dL (ref 8.9–10.3)
CO2: 29 mmol/L (ref 22–32)
CREATININE: 0.98 mg/dL (ref 0.61–1.24)
Chloride: 105 mmol/L (ref 101–111)
GLUCOSE: 89 mg/dL (ref 65–99)
Potassium: 4.1 mmol/L (ref 3.5–5.1)
SODIUM: 145 mmol/L (ref 135–145)
Total Bilirubin: 0.7 mg/dL (ref 0.3–1.2)
Total Protein: 6.4 g/dL — ABNORMAL LOW (ref 6.5–8.1)

## 2017-02-05 NOTE — Evaluation (Signed)
Speech Language Pathology Assessment and Plan  Patient Details  Name: Brandon Howard MRN: [id number] Date of Birth: 01/22/1941  SLP Diagnosis: Speech and Language deficits;Dysphagia (difficult to conduct differential diagnosis of aphasia vs. dysarthria)  Rehab Potential: Fair ELOS: 3 to 3.5 weeks    Today's Date: 02/05/2017 SLP Individual Time: 1430-1530 SLP Individual Time Calculation (min): 60 min   Problem List:  Patient Active Problem List   Diagnosis Date Noted  . Arterial ischemic stroke, vertebrobasilar, brainstem, acute, unspecified laterality ([place]) 02/04/2017  . Hyperlipidemia   . Polysubstance abuse   . 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 Right hemiparesis ([place])   . Dysphagia, post-stroke   . History of CVA with residual deficit   . Hyperglycemia   . Benign essential HTN   . Dysarthria, post-stroke   . Cocaine abuse   . Stroke (cerebrum) ([place]) 01/31/2017   Past Medical History:  Past Medical History:  Diagnosis Date  . Gout   . Hepatitis   . Hypertension   . Pancreatitis   . Right knee pain   . Stroke ([place])   . Vitamin B 12 deficiency    Past Surgical History:  Past Surgical History:  Procedure Laterality Date  . CHOLECYSTECTOMY    . CORONARY ARTERY BYPASS GRAFT  2005    Assessment / Plan / Recommendation Clinical Impression Brandon Shellhammer Underwoodis a 76 y.o.malewith history of HTN, CVA with residual RUE weakness and moderate dysarthria (CIR stay 10/2008) who was admitted on 01/31/17 with reports of increased weakness LUE and speech difficulty. History taken from chart review. CT head with concerns of left hemipons acute/subacute infarct and encephalomalacia right cerebellar hemisphere. CTA head/neck revealed moderate to severe bilateral distal VA stenosis and critical stenosis or short segment occlusion of proximal BA with reconstituted flow distal BA but proximal right PCA stenosis with poor  flow. MRI reviewed, showing bilateral infarcts. Per report and MRA brain, acute infarcts in left >right pons, bilateral cerebellum and right thalamus, critical proximal BS stenosis with question thrombus or irregular plaque. 2D echo with EF 55- 60% and no wall abnormality. 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 Moderate to severe dysarthria noted with dysphagia and patient placed on dysphagia 3, nectar liquids. He was started on IV heparin for treatment of vasospasms and transitioned to oral ASA+ Plavix on 8/6 As follow up CT head showed no significant changes. Dr. Leonie Man recommended monitoring for ETOH withdrawal as well as counseling to stop frequent cocaine use. Therapy ongoing and patient with significant dysarthria and dysphagia as well as decline in mobility and ability to carry out ADL tasks. CIR recommended by MD/rehab team and admited on 02/04/17.   Comprehensive speech-language and bedisde swallow evaluations completed on 02/05/17. Pt presents with severe expressive difficulties. During this evaluation, it was difficult to differentiately diagnosis expressive aphasia vs dysarthria. Despite pt's severe oral motor deficits, pt also presented with what appeared to be lots of paraphasias. Pt was <77% intelligible at the word level. Given bilateral infarcts, cognitive deficits cannot rule out at this time. Pt with difficulty following directions, sustaining attention, consistently answering yes/no or safety awareness. Additionally, pt with oropharyngeal dysphagia c/b oral residue, decreased bolus management and delayed swallow initiation with thin liquids. Continue to recommend dysphagia 3, necatr thick liquids via cup with direct nursing supervision d/t cognitive deficits. Skilled ST is required to address the above mentioned deficits, increase functional independence  and reduce caregiver burden. Anticipate that pt will require 24 hour  supervision, possibility of SNF placement will be ongoing with need for follow-up ST services.    Skilled Therapeutic Interventions          Skilled treatment session focused on completion of speech-language and bedside swallow evaluations, see above. Education provided to nursing on strategies to increase functional communication. With Max A multimodal cues, pt able to indicate yes/no with gestures and ~ 50% accuracy. Pt required total A to Max A to implement over articulation and increased vocal intensity but strategies were largely ineffective in increasing verbal communication.   SLP Assessment  Patient will need skilled Speech Lanaguage Pathology Services during CIR admission    Recommendations  SLP Diet Recommendations: Dysphagia 3 (Mech soft);Nectar Liquid Administration via: Cup Medication Administration: Crushed with puree Supervision: Patient able to self feed;Full supervision/cueing for compensatory strategies Compensations: Minimize environmental distractions;Slow rate;Small sips/bites;Lingual sweep for clearance of pocketing Postural Changes and/or Swallow Maneuvers: Seated upright 90 degrees Oral Care Recommendations: Oral care BID Patient destination: Home (Possibly SNF if family can't provide assistance) Follow up Recommendations: Home Health SLP;24 hour supervision/assistance;Skilled Nursing facility Equipment Recommended: To be determined    SLP Frequency 3 to 5 out of 7 days   SLP Duration  SLP Intensity  SLP Treatment/Interventions 3 to 3.5 weeks  Minumum of 1-2 x/day, 30 to 90 minutes  Cognitive remediation/compensation;Cueing hierarchy;Dysphagia/aspiration precaution training;Functional tasks;Internal/external aids;Multimodal communication approach;Oral motor exercises;Speech/Language facilitation;Therapeutic Activities;Patient/family education    Pain    Prior Functioning Cognitive/Linguistic Baseline: Within functional limits Type of Home: Apartment  Lives  With: Alone Available Help at Discharge: Family (Plan is to live with son) Vocation: Retired  Function:  Eating Eating   Modified Consistency Diet: No Eating Assist Level: More than reasonable amount of time;Set up assist for;Supervision or verbal cues;Helper checks for pocketed food   Eating Set Up Assist For: Opening containers       Cognition Comprehension Comprehension assist level: Understands basic 50 - 74% of the time/ requires cueing 25 - 49% of the time;Understands basic 25 - 49% of the time/ requires cueing 50 - 75% of the time  Expression   Expression assist level: Expresses basis less than 25% of the time/requires cueing >75% of the time.  Social Interaction Social Interaction assist level: Interacts appropriately 50 - 74% of the time - May be physically or verbally inappropriate.  Problem Solving Problem solving assist level: Solves basic less than 25% of the time - needs direction nearly all the time or does not effectively solve problems and may need a restraint for safety  Memory Memory assist level: Recognizes or recalls less than 25% of the time/requires cueing greater than 75% of the time   Short Term Goals: Week 1: SLP Short Term Goal 1 (Week 1): Pt will communicate basic wants and needs via multimodal communication with Max A cues.  SLP Short Term Goal 2 (Week 1): Pt will utilize speech intelligibility strategies to achieve ~ 50% intelligibility at the word level with Max A cues.  SLP Short Term Goal 3 (Week 1): Pt will answer yes/no questions using multimodalities with ~ 50% accuracy and Mod A cues.  SLP Short Term Goal 4 (Week 1): Pt will consume dysphagia 3 and nectar thick liquids with minimal overt s/s of aspiration and Max A cues for use of compensatory swallow strategies.  SLP Short Term Goal 5 (Week 1): Pt will demonstrate overt s/s of aspiration with trials of ice chips to  demonstrate readiness for repeat instrumental swallow study.   Refer to Care Plan for  Long Term Goals  Recommendations for other services: None   Discharge Criteria: Patient will be discharged from SLP if patient refuses treatment 3 consecutive times without medical reason, if treatment goals not met, if there is a change in medical status, if patient makes no progress towards goals or if patient is discharged from hospital.  The above assessment, treatment plan, treatment alternatives and goals were discussed and mutually agreed upon: No family available/patient unable  Genella Bas 02/05/2017, 3:36 PM

## 2017-02-05 NOTE — Evaluation (Signed)
Physical Therapy Assessment and Plan  Patient Details  Name: Brandon Howard MRN: [id number] Date of Birth: May 27, 1941  PT Diagnosis: Abnormality of gait, Cognitive deficits, Difficulty walking, Hemiparesis dominant, Hypertonia and Muscle weakness Rehab Potential: Fair ELOS: 18 to 21 days   Today's Date: 02/05/2017 PT Individual Time: 0775-1000 PT Individual Time Calculation (min): 55 min    Problem List:  Patient Active Problem List   Diagnosis Date Noted  . Arterial ischemic stroke, vertebrobasilar, brainstem, acute, unspecified laterality ([place]) 02/04/2017  . Hyperlipidemia   . Polysubstance abuse   . 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 Right hemiparesis ([place])   . Dysphagia, post-stroke   . History of CVA with residual deficit   . Hyperglycemia   . Benign essential HTN   . Dysarthria, post-stroke   . Cocaine abuse   . Stroke (cerebrum) ([place]) 01/31/2017    Past Medical History:  Past Medical History:  Diagnosis Date  . Gout   . Hepatitis   . Hypertension   . Pancreatitis   . Right knee pain   . Stroke ([place])   . Vitamin B 12 deficiency    Past Surgical History:  Past Surgical History:  Procedure Laterality Date  . CHOLECYSTECTOMY    . CORONARY ARTERY BYPASS GRAFT  2005    Assessment & Plan Clinical Impression: Patient is a 76 y.o. year old male with recent admission to the hospital on 31 Jan 2017 with history of HTN, CVA with residual RUE weakness and moderate dysarthria (CIR stay 77/2010) who was admitted on 01/31/17 with reports of increased weakness LUE and speech difficulty. History taken from chart review. CT head with concerns of left hemipons acute/subacute infarct and encephalomalacia right cerebellar hemisphere. CTA head/neck revealed moderate to severe bilateral distal VA stenosis and critical stenosis or short segment occlusion of proximal BA with reconstituted flow distal BA but proximal right  PCA stenosis with poor flow. MRI reviewed, showing bilateral infarcts. Per report and MRA brain, acute infarcts in left > right pons, bilateral cerebellum and right thalamus, critical proximal BS stenosis with question thrombus or irregular plaque. 2D echo with EF 55- 60% and no wall abnormality. 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 Moderate to severe dysarthria noted with dysphagia and patient placed on dysphagia 3, nectar liquids. .  Patient transferred to CIR on 02/04/2017 .   Patient currently requires max with mobility secondary to muscle weakness and muscle paralysis, impaired timing and sequencing, abnormal tone, decreased coordination and decreased motor planning, decreased motor planning, decreased awareness, decreased safety awareness and delayed processing and decreased sitting balance, decreased standing balance, decreased postural control, hemiplegia and decreased balance strategies.  Prior to hospitalization, patient was modified independent  with mobility and lived with Alone in a [place] home.  Home access is   .  Patient will benefit from skilled PT intervention to maximize safe functional mobility, minimize fall risk and decrease caregiver burden for planned discharge home with 24 hour assist.  Anticipate patient will benefit from follow up [facility] at discharge.  PT - End of Session Activity Tolerance: Tolerates 30+ min activity with multiple rests Endurance Deficit: Yes PT Assessment Rehab Potential (ACUTE/IP ONLY): Fair PT Patient demonstrates impairments in the following area(s): Balance;Endurance;Motor;Perception;Safety PT Transfers Functional Problem(s): Bed Mobility;Bed to Chair;Car;Furniture PT Locomotion Functional Problem(s): Ambulation;Wheelchair Mobility;Stairs PT Plan PT Intensity: Minimum of 1-2 x/day ,45 to 90 minutes PT Frequency: 5  out of 7 days PT Duration Estimated Length of Stay: 18 to 21 days PT  Treatment/Interventions: Ambulation/gait training;Balance/vestibular training;Cognitive remediation/compensation;Community reintegration;Discharge planning;Disease management/prevention;DME/adaptive equipment instruction;Neuromuscular re-education;Functional mobility training;Psychosocial support;Splinting/orthotics;Skin care/wound management;Therapeutic Activities;Therapeutic Exercise;UE/LE Strength taining/ROM;Stair training;Patient/family education;UE/LE Coordination activities;Visual/perceptual remediation/compensation;Wheelchair propulsion/positioning PT Transfers Anticipated Outcome(s): Supervision to min assist PT Locomotion Anticipated Outcome(s): mod assist ambulation PT Recommendation Follow Up Recommendations: Home health PT;24 hour supervision/assistance Patient destination: Home (Son plans to provide full time care) Equipment Recommended: Wheelchair (measurements) (To be determined)  Skilled Therapeutic Intervention Session today focused on improving functional mobility for D/C home with family.  Pt propelled w/c with min to mod assist and occasionally requires verbal cues not to reach for rail on wall to assist in w/c propulsion.  With ambulation pt requires total assist for advancing R LE, placing R LE, weight bearing through R LE and stabilizing R knee.  With transfers, pt requires significant cueing due to significant posterior lean.  Vitals Pre: 112/66/  HR: 85 bpm.  Following session, pt left up in chair with lap belt in place with LCSW, Becky, in room with patient.  Pt denied pain.  PT Evaluation Precautions/Restrictions Precautions Precautions: Fall Restrictions Weight Bearing Restrictions: No Other Position/Activity Restrictions: Dysarthria from prior stroke with prior R sided weakness General Chart Reviewed: Yes Additional Pertinent History: Prior stroke with R sided weakness with some dysarthria Family/Caregiver Present: No Vital SignsTherapy Vitals Temp: 98.3 F (36.8  C) Temp Source: Oral Pulse Rate: 81 Resp: 18 BP: 103/62 Patient Position (if appropriate): Sitting Oxygen Therapy SpO2: 95 % O2 Device: Not Delivered    Home Living/Prior Functioning Home Living Available Help at Discharge: Family (Plan is to live with son) Type of Home: Apartment Additional Comments:  (Dysarthria impacts ability to understand history clearly)  Lives With: Alone Prior Function Vocation: Retired Vision/Perception     Cognition Overall Cognitive Status: Impaired/Different from baseline Arousal/Alertness: Awake/alert Orientation Level: Oriented to person (Unable to express further information d/t severe dysarthria) Attention: Sustained Sustained Attention: Impaired Sustained Attention Impairment: Functional basic Memory: Impaired Memory Impairment: Decreased recall of new information Awareness: Impaired Awareness Impairment: Intellectual impairment Problem Solving: Impaired Problem Solving Impairment: Verbal basic;Functional basic Executive Function:  (All areas impacted by lower level deficits) Safety/Judgment: Impaired Comments:  (Clock drawing: 2/3; pt puts all numbers in place and draws clock at 3 PM, however, numbers are illegible) Sensation Sensation Light Touch: Not tested Additional Comments:  (With L UE, pt unable to perform L sided RAMPS and demos past pointing with finger to nose) Coordination Gross Motor Movements are Fluid and Coordinated: No Fine Motor Movements are Fluid and Coordinated: No Motor  Motor Motor: Motor apraxia;Abnormal tone Motor - Skilled Clinical Observations:  (Pt pushes posteriorly and to over strong side during standing mobility activities.)      Trunk/Postural Assessment  Thoracic Assessment Thoracic Assessment: Exceptions to [facility] (Increased kyphosis) Lumbar Assessment Lumbar Assessment: Exceptions to [facility] (Increased posterior pelvic tilt)  Balance Balance Balance Assessed: Yes (Pt requires mod to max assist for  standing balance due to posterior lean) Dynamic Sitting Balance Dynamic Sitting - Balance Support: Feet unsupported;No upper extremity supported Extremity Assessment  RUE Assessment RUE Assessment: Exceptions to [facility] RUE AROM (degrees) RUE Overall AROM Comments: Pt unable to isolate significant movement with R UE.  Only able to passively elevate R UE to 90 degrees.  R elbow grossly limited by 15 degrees extension passively RUE Tone RUE Tone: Moderate LUE Assessment LUE Assessment: Within Functional Limits RLE Assessment RLE Assessment: Exceptions to [facility]  RLE AROM (degrees) RLE Overall AROM Comments: Pt with decreased passive ankle DF on R LE;  Flexor synergy noted while pt attempting to isolate movement while supine in bed.  Also has flexor synergy when attempting gait. RLE Tone RLE Tone: Mild LLE Assessment LLE Assessment: Within Functional Limits   See Function Navigator for Current Functional Status.   Refer to Care Plan for Long Term Goals  Recommendations for other services: None   Discharge Criteria: Patient will be discharged from PT if patient refuses treatment 3 consecutive times without medical reason, if treatment goals not met, if there is a change in medical status, if patient makes no progress towards goals or if patient is discharged from hospital.  The above assessment, treatment plan, treatment alternatives and goals were discussed and mutually agreed upon: by patient  Berlinda Farve Hilario Quarry 02/05/2017, 4:28 PM

## 2017-02-05 NOTE — Progress Notes (Signed)
Subjective/Complaints:  Patient is severely dysarthric, speech intelligibility is about 10% Review of systems cannot obtain due to dysarthria.  Objective: Vital Signs: Blood pressure (!) 130/58, pulse 78, temperature 97.8 F (36.6 C), temperature source Oral, resp. rate 18, height '5\' 7"'$  (1.702 m), weight 69.5 kg (153 lb 3.5 oz), SpO2 99 %. No results found. Results for orders placed or performed during the hospital encounter of 02/04/17 (from the past 72 hour(s))  Comprehensive metabolic panel     Status: Abnormal   Collection Time: 02/05/17  4:40 AM  Result Value Ref Range   Sodium 145 135 - 145 mmol/L   Potassium 4.1 3.5 - 5.1 mmol/L   Chloride 105 101 - 111 mmol/L   CO2 29 22 - 32 mmol/L   Glucose, Bld 89 65 - 99 mg/dL   BUN 16 6 - 20 mg/dL   Creatinine, Ser 0.98 0.61 - 1.24 mg/dL   Calcium 9.1 8.9 - 10.3 mg/dL   Total Protein 6.4 (L) 6.5 - 8.1 g/dL   Albumin 3.5 3.5 - 5.0 g/dL   AST 22 15 - 41 U/L   ALT 17 17 - 63 U/L   Alkaline Phosphatase 52 38 - 126 U/L   Total Bilirubin 0.7 0.3 - 1.2 mg/dL   GFR calc non Af Amer >60 >60 mL/min   GFR calc Af Amer >60 >60 mL/min    Comment: (NOTE) The eGFR has been calculated using the CKD EPI equation. This calculation has not been validated in all clinical situations. eGFR's persistently <60 mL/min signify possible Chronic Kidney Disease.    Anion gap 11 5 - 15  CBC WITH DIFFERENTIAL     Status: None   Collection Time: 02/05/17  4:40 AM  Result Value Ref Range   WBC 6.0 4.0 - 10.5 K/uL   RBC 5.30 4.22 - 5.81 MIL/uL   Hemoglobin 16.2 13.0 - 17.0 g/dL   HCT 49.7 39.0 - 52.0 %   MCV 93.8 78.0 - 100.0 fL   MCH 30.6 26.0 - 34.0 pg   MCHC 32.6 30.0 - 36.0 g/dL   RDW 14.1 11.5 - 15.5 %   Platelets 277 150 - 400 K/uL   Neutrophils Relative % 54 %   Neutro Abs 3.3 1.7 - 7.7 K/uL   Lymphocytes Relative 33 %   Lymphs Abs 2.0 0.7 - 4.0 K/uL   Monocytes Relative 11 %   Monocytes Absolute 0.7 0.1 - 1.0 K/uL   Eosinophils Relative 2  %   Eosinophils Absolute 0.1 0.0 - 0.7 K/uL   Basophils Relative 0 %   Basophils Absolute 0.0 0.0 - 0.1 K/uL     HEENT: Right facial droop , dysarthria Cardio: RRR and no murmur Resp: CTA B/L and Unlabored GI: BS positive and NT, ND Extremity:  Pulses positive and No Edema Skin:   Intact Neuro: Cranial Nerve Abnormalities Right central 7, Abnormal Sensory Cannot assess secondary to severe dysarthria, Abnormal Motor 3 minus, right deltoid, biceps, triceps, grip, hip flexor, knee extensor, ankle dorsiflexor and Abnormal FMC Ataxic/ dec FMC Musc/Skel:  Other No pain with upper extremity or lower extremity range of motion Gen. no acute distress   Assessment/Plan: 1. 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 Physiatrist is providing close team supervision and 24 hour management of active medical problems listed below. Physiatrist and rehab team continue to assess barriers to discharge/monitor patient progress toward functional and medical  goals. FIM:       Function - Toileting Assist level: More than reasonable time           Function - Comprehension Comprehension: Auditory Comprehension assist level: Follows basic conversation/direction with no assist  Function - Expression Expression: Verbal Expression assist level: Expresses basic needs/ideas: With no assist  Function - Social Interaction Social Interaction assist level: Interacts appropriately with others - No medications needed.     Function - Memory Memory assist level: Complete Independence: No helper  Medical Problem List and Plan: 1.  Dysarthria and dysphagia as well as decline in mobility and ability to carry out ADL tasks. secondary to bilateral CVA. Initiate PT, OT, speech, CIR level 2.  DVT Prophylaxis/Anticoagulation: Pharmaceutical: Lovenox, monitor platelets, platelets were  normal on 02/05/2017 3. Pain Management: N/A 4. Mood: LCSW to follow for evaluation and support.  5. Neuropsych: This patient is not fully capable of making decisions on his own behalf. 6. Skin/Wound Care: routine pressure relief measures.  7. Fluids/Electrolytes/Nutrition: Monitor I/O. Check lytes in am as on nectar liquids. Monitor for signs of dehydration. B met normal on 02/05/2017 8. HTN: Permissive HTN for 5-7 days--Meds resumed today prior to admission. Monitor for hypotension  in setting of proximal BA stenosis.  Vitals:   02/04/17 1600 02/05/17 0415  BP: (!) 150/74 (!) 130/58  Pulse: 80 78  Resp: 19 18  Temp: 98.2 F (36.8 C) 97.8 F (36.6 C)  SpO2: 95% 99%   9. Hyperlipidemia: On Zocor.  10. Polysubstance abuse: Counsel on importance of cessation. Ask neuropsychology to evaluate 11. CAD s/p CABG: Asymptomatic. On ASA, Plavix and Zocor.   LOS (Days) 1 A FACE TO FACE EVALUATION WAS PERFORMED  Ceri Mayer E 02/05/2017, 10:41 AM

## 2017-02-05 NOTE — Progress Notes (Signed)
Brandon Fennel, MD Physician Signed Physical Medicine and Rehabilitation  PMR Pre-admission Date of Service: 02/04/2017 1:20 PM  Related encounter: ED to Hosp-Admission (Discharged) from 01/31/2017 in [facility] NEURO MEDICAL       [] Hide copied text PMR Admission Coordinator Pre-Admission Assessment  Patient: Brandon Howard is an 76 y.o., male MRN: [id number] DOB: 1941/01/04 Height: 5\' 7"  (170.2 cm) Weight: 69.2 kg (152 lb 9.6 oz)                                                                                                                                                  Insurance Information HMO: X    PPO:      PCP:      IPA:      80/20:      OTHER:  PRIMARY: [employer]       Policy#: [id number]      Subscriber: Self  CM Name: Brandon Howard      Phone#: [phone] [id number]     Fax#: [phone] Pre-Cert#: [id number]      Employer: Retired  Benefits:  Phone #: [phone]     Name: Verified online and with Brandon Howard. Date: 06/30/16     Deduct: $0      Out of Pocket Max: $5900      Life Max: N/Howard CIR: $295 Howard day, days 1-6; $0 Howard day for days 7+      SNF: $0 Howard day. Days 1-20; $167 Howard days days 21-100 Outpatient: $40 per visit outpatient hospital, $101 per visit with specialist office visit      Home Health: 100%      Co-Pay: none DME: 80%     Co-Pay: 20% Providers: In-network   SECONDARY: None     Medicaid Application Date:       Case Manager:  Disability Application Date:       Case Worker:   Emergency Contact Information        Contact Information    Name Relation Home Work Landen Son   [phone]   Eda Keys [phone]       Current Medical History  Patient Admitting Diagnosis: Bilateral CVA  History of Present Illness: Brandon Howard Howard 76 y.o.malewith history of HTN, CVA with residual RUE weakness and moderate dysarthria (CIR stay 10/2008) who was admitted on 01/31/17 with reports of increased  weakness LUE and speech difficulty. History taken from chart review. CT head with concerns of left hemipons acute/subacute infarct and encephalomalacia right cerebellar hemisphere. 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 MRI reviewed, showing bilateral infarcts. Per report and MRA brain, acute infarcts in left > right pons, bilateral  cerebellum and right thalamus, critical proximal BS stenosis with question thrombus or irregular plaque. 2D echo with EF 55- 60% and no wall abnormality. 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 IV heparin X one week followed by repeat CTA to monitor for resolution of vasospasms. Moderate to severe dysarthria noted with dysphagia and patient placed on dysphagia 3 textures and nectar-thick liquids. PT evaluation done revealing deficits in mobility with RLE weakness and CIR recommended for follow up therapy. Patient admitted to IP Rehab 02/04/17.   NIH Total: 8  Past Medical History      Past Medical History:  Diagnosis Date  . Gout   . Hepatitis   . Hypertension   . Pancreatitis   . Right knee pain   . Stroke (HCC)   . Vitamin B 12 deficiency     Family History  family history includes COPD in his father; Throat cancer in his mother.  Prior Rehab/Hospitalizations:  Has the patient had major surgery during 100 days prior to admission? No  Current Medications   Current Facility-Administered Medications:  .   stroke: mapping our early stages of recovery book, , Does not apply, Once, Brandon Howard, Brandon M, MD .  acetaminophen (TYLENOL) tablet 650 mg, 650 mg, Oral, Q4H PRN **OR** [DISCONTINUED] acetaminophen (TYLENOL) solution 650 mg, 650 mg, Per Tube, Q4H PRN **OR** acetaminophen (TYLENOL) suppository 650 mg, 650 mg, Rectal,  Q4H PRN, Brandon Howard, Brandon M, MD .  allopurinol (ZYLOPRIM) tablet 100 mg, 100 mg, Oral, Daily, Brandon Howard, Brandon T, MD, 100 mg at 02/04/17 0913 .  aspirin chewable tablet 81 mg, 81 mg, Oral, Daily, Brandon Howard, Brandon A, NP, 81 mg at 02/04/17 0913 .  clopidogrel (PLAVIX) tablet 75 mg, 75 mg, Oral, Daily, Brandon Howard, Brandon A, NP, 75 mg at 02/04/17 0912 .  heparin injection 5,000 Units, 5,000 Units, Subcutaneous, Q8H, Brandon Howard, Brandon T, MD, 5,000 Units at 02/04/17 1333 .  hydrALAZINE (APRESOLINE) injection 10 mg, 10 mg, Intravenous, Q8H PRN, Bruna PotterAldridge, Brandon Howard, Howard-C .  MEDLINE mouth rinse, 15 mL, Mouth Rinse, BID, Brandon Howard, Brandon T, MD, 15 mL at 02/04/17 0913 .  RESOURCE THICKENUP CLEAR, , Oral, PRN, Brandon Howard, Brandon T, MD .  senna-docusate (Senokot-S) tablet 1 tablet, 1 tablet, Oral, QHS PRN, Brandon Howard, Brandon M, MD .  simvastatin (ZOCOR) tablet 40 mg, 40 mg, Oral, QPM, Brandon Howard, Brandon T, MD, 40 mg at 02/03/17 1742  Patients Current Diet: DIET DYS 3 Room service appropriate? Yes; Fluid consistency: Nectar Thick  Precautions / Restrictions Precautions Precautions: Fall Restrictions Weight Bearing Restrictions: No   Has the patient had 2 or more falls or Howard fall with injury in the past year?No, 1 fall without injury   Prior Activity Level Limited Community (1-2x/wk): [place] admission [place] [facility] where he had noon meals prepared for him.  He was independent with use of his walker in his apartment and went out about once Howard week to eat or to go to medical appointments with his son driving.   Home Assistive Devices / Equipment Home Assistive Devices/Equipment: Gilmer MorCane (specify quad or straight)  Prior Device Use: Indicate devices/aids used by the patient prior to current illness, exacerbation or injury? Walker  Prior Functional Level Prior Function Level of Independence: Independent with assistive device(s) Comments: walks with Howard cane, does not drive,  independent in ADL and IADL  Self Care: Did the patient need help bathing, dressing, using the toilet or eating? Independent  Indoor Mobility: Did the patient need assistance with walking from room to room (with or without device)? Independent  Stairs: Did the patient need assistance with internal or external stairs (with or without device)? Independent  Functional Cognition: Did the patient need help planning regular tasks such as shopping or remembering to take medications? Independent  Current Functional Level Cognition  Arousal/Alertness: Awake/alert Overall Cognitive Status: Impaired/Different from baseline Difficult to assess due to: Impaired communication Orientation Level: Oriented X4 Safety/Judgement: Decreased awareness of safety Attention: Sustained Sustained Attention: Impaired Sustained Attention Impairment: Functional basic Memory: Impaired Memory Impairment: Decreased recall of new information (3/3 immediate recall  1/3 delayed recall) Awareness: Appears intact Problem Solving: Impaired Problem Solving Impairment: Verbal basic Safety/Judgment: Appears intact    Extremity Assessment (includes Sensation/Coordination)  Upper Extremity Assessment: RUE deficits/detail RUE Deficits / Details: 4-/5 gross grasp, 3+/5 elbow, 2/5 shoulder, pain with shoulder ROM above 90 degrees, + subluxation RUE Coordination: decreased fine motor, decreased gross motor  Lower Extremity Assessment: Defer to PT evaluation RLE Deficits / Details: hip flexion 3/5, abduction 3+, knee extension 3-/5; ankle DF 2+/5    ADLs  Overall ADL's : Needs assistance/impaired Eating/Feeding: Set up, Sitting Grooming: Wash/dry hands, Wash/dry face, Sitting, Minimal assistance, Oral care Upper Body Bathing: Moderate assistance Lower Body Bathing: Sit to/from stand, Maximal assistance Upper Body Dressing : Moderate assistance, Sitting Lower Body Dressing: Maximal assistance, Sit to/from  stand Toilet Transfer: Moderate assistance, Stand-pivot, BSC Toileting- Clothing Manipulation and Hygiene: Maximal assistance, Sit to/from stand General ADL Comments: Pt reports he could lift his R arm and use it as Howard functional assist prior to this event.    Mobility  Overal bed mobility: Needs Assistance Bed Mobility: Supine to Sit Supine to sit: Min assist Sit to supine: Min assist General bed mobility comments: min assist to rotate trunk to EOB    Transfers  Overall transfer level: Needs assistance Equipment used: Rolling walker (2 wheeled) Transfers: Sit to/from Stand Sit to Stand: Mod assist Stand pivot transfers: Mod assist General transfer comment: Manual and verbal cues for hand placement and positioning    Ambulation / Gait / Stairs / Wheelchair Mobility  Ambulation/Gait Ambulation/Gait assistance: Mod assist, +2 physical assistance Ambulation Distance (Feet): 70 Feet Assistive device: Rolling walker (2 wheeled) Gait Pattern/deviations: Step-to pattern, Decreased step length - right, Decreased dorsiflexion - right, Steppage, Ataxic, Narrow base of support General Gait Details: Multi modal cues for gait retraining, manual weight shift and pacing facilitation required (2 standing rest breaks) Gait velocity: Decreased Gait velocity interpretation: Below normal speed for age/gender    Posture / Balance Balance Overall balance assessment: Needs assistance Sitting-balance support: Single extremity supported, Feet unsupported Sitting balance-Leahy Scale: Poor Standing balance support: Bilateral upper extremity supported Standing balance-Leahy Scale: Poor Standing balance comment: posterior lean    Special needs/care consideration BiPAP/CPAP: No CPM: No Continuous Drip IV: No Dialysis: No         Life Vest: No Oxygen: No Special Bed: No Trach Size: No Wound Vac (area): No       Skin: Abrasion to the right foot and knee; Bruising right back, hip, and leg;  Moisture associated skin damage to medial portion of buttocks                                 Bowel mgmt: Continent 02/01/17 Bladder mgmt: Catheter present  Diabetic mgmt: HgbA1c- 5.3     Previous Home Environment Living  Arrangements: Alone  Lives With: Alone Home Care Services: No Additional Comments: pt difficult to understand, therefore full history not obtained  Discharge Living Setting Plans for Discharge Living Setting: Patient's home, Other (Comment) (son to be there as recommended 24/7  ) Type of Home at Discharge: Apartment Discharge Home Layout: One level Discharge Home Access: Level entry Discharge Bathroom Shower/Tub: Tub/shower unit Discharge Bathroom Toilet: Standard Discharge Bathroom Accessibility: Yes How Accessible: Accessible via walker Does the patient have any problems obtaining your medications?: No  Social/Family/Support Systems Patient Roles: Parent Contact Information: Son: Patrice Moates [phone]; Sister: Roxanna Mew [phone] Anticipated Caregiver: Janet Berlin  Anticipated Caregiver's Contact Information: see above Ability/Limitations of Caregiver: he works, but has Designer, industrial/product Availability: 24/7 Discharge Plan Discussed with Primary Caregiver: Yes Is Caregiver In Agreement with Plan?: Yes Does Caregiver/Family have Issues with Lodging/Transportation while Pt is in Rehab?: No  Goals/Additional Needs Patient/Family Goal for Rehab: PT/OT Supervision-Min Howard; SLP Mod I-Supervision  Expected length of stay: 18-23 days  Cultural Considerations: Baptist  Dietary Needs: Dys.3 textures and Nectar-thick liquids  Equipment Needs: TBD Special Service Needs: None Additional Information: 4 children total: 1 Fayrene Fearing is local, 2 out of town, 1 son died about Howard month ago due to Howard motor cycle accident  Pt/Family Agrees to Admission and willing to participate: Yes Program Orientation Provided & Reviewed with Pt/Caregiver Including Roles  &  Responsibilities: Yes Additional Information Needs: None Information Needs to be Provided By: N/Howard  Decrease burden of Care through IP rehab admission: No  Possible need for SNF placement upon discharge: Potentially  Patient Condition: This patient's medical and functional status has changed since the consult dated: 02/02/17 at 1341 in which the Rehabilitation Physician determined and documented that the patient's condition is appropriate for intensive rehabilitative care in an inpatient rehabilitation facility. See "History of Present Illness" (above) for medical update. Functional changes are: Mod Howard +2 70 feet. Patient's medical and functional status update has been discussed with the Rehabilitation physician and patient remains appropriate for inpatient rehabilitation. Will admit to inpatient rehab today.  Preadmission Screen Completed By:  Fae Pippin, 02/04/2017 1:36 PM ______________________________________________________________________   Discussed status with Dr. Allena Katz on 02/04/17 at 1335 and received telephone approval for admission today.  Admission Coordinator:  Fae Pippin, time 1335/Date 02/04/17       Revision History

## 2017-02-05 NOTE — Care Management Note (Signed)
Inpatient Rehabilitation Center Individual Statement of Services  Patient Name:  Brandon Howard  Date:  02/05/2017  Welcome to the Inpatient Rehabilitation Center.  Our goal is to provide you with an individualized program based on your diagnosis and situation, designed to meet your specific needs.  With this comprehensive rehabilitation program, you will be expected to participate in at least 3 hours of rehabilitation therapies Monday-Friday, with modified therapy programming on the weekends.  Your rehabilitation program will include the following services:  Physical Therapy (PT), Occupational Therapy (OT), Speech Therapy (ST), 24 hour per day rehabilitation nursing, Therapeutic Recreaction (TR), Neuropsychology, Case Management (Social Worker), Rehabilitation Medicine, Nutrition Services and Pharmacy Services  Weekly team conferences will be held on Wednesday to discuss your progress.  Your Social Worker will talk with you frequently to get your input and to update you on team discussions.  Team conferences with you and your family in attendance may also be held.  Expected length of stay: 18-21 days Overall anticipated outcome: min-mod level of assist  Depending on your progress and recovery, your program may change. Your Social Worker will coordinate services and will keep you informed of any changes. Your Social Worker's name and contact numbers are listed  below.  The following services may also be recommended but are not provided by the Inpatient Rehabilitation Center:  Home Health Rehabiltiation Services  Outpatient Rehabilitation Services    Arrangements will be made to provide these services after discharge if needed.  Arrangements include referral to agencies that provide these services.  Your insurance has been verified to be:  [place] [employer] Your primary doctor is:  Fleet Contrasdwin Avbuere  Pertinent information will be shared with your doctor and your insurance company.  Social  Worker:  Dossie DerBecky Meher Kucinski, [phone] or ([phone]) [phone]  Information discussed with and copy given to patient by: Lucy Chrisupree, Analeah Brame G, 02/05/2017, 10:11 AM

## 2017-02-05 NOTE — Progress Notes (Signed)
Patient information reviewed and entered into eRehab system by Elis Sauber, RN, CRRN, PPS Coordinator.  Information including medical coding and functional independence measure will be reviewed and updated through discharge.     Per nursing patient was given "Data Collection Information Summary for Patients in Inpatient Rehabilitation Facilities with attached "Privacy Act Statement-Health Care Records" upon admission.  

## 2017-02-05 NOTE — Evaluation (Signed)
Occupational Therapy Assessment and Plan  Patient Details  Name: Brandon Howard MRN: [id number] Date of Birth: 1941/05/17  OT Diagnosis: abnormal posture, apraxia, ataxia, cognitive deficits, hemiplegia affecting dominant side and muscle weakness (generalized) Rehab Potential: Rehab Potential (ACUTE ONLY): Fair ELOS: 18-21 days   Today's Date: 02/05/2017 OT Individual Time: 1300-1415 OT Individual Time Calculation (min): 75 min     Problem List:  Patient Active Problem List   Diagnosis Date Noted  . Arterial ischemic stroke, vertebrobasilar, brainstem, acute, unspecified laterality ([place]) 02/04/2017  . Hyperlipidemia   . Polysubstance abuse   . 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 Right hemiparesis ([place])   . Dysphagia, post-stroke   . History of CVA with residual deficit   . Hyperglycemia   . Benign essential HTN   . Dysarthria, post-stroke   . Cocaine abuse   . Stroke (cerebrum) ([place]) 01/31/2017    Past Medical History:  Past Medical History:  Diagnosis Date  . Gout   . Hepatitis   . Hypertension   . Pancreatitis   . Right knee pain   . Stroke ([place])   . Vitamin B 12 deficiency    Past Surgical History:  Past Surgical History:  Procedure Laterality Date  . CHOLECYSTECTOMY    . CORONARY ARTERY BYPASS GRAFT  2005    Assessment & Plan Clinical Impression: TRACEN MAHLER a 76 y.o.malewith history of HTN, CVA with residual RUE weakness and moderate dysarthria (CIR stay 10/2008) who was admitted on 01/31/17 with reports of increased weakness LUE and speech difficulty. History taken from chart review. CT head with concerns of left hemipons acute/subacute infarct and encephalomalacia right cerebellar hemisphere. CTA head/neck revealed moderate to severe bilateral distal VA stenosis and critical stenosis or short segment occlusion of proximal BA with reconstituted flow distal BA but proximal right PCA  stenosis with poor flow. MRI reviewed, showing bilateral infarcts. Per report and MRA brain, acute infarcts in left > right pons, bilateral cerebellum and right thalamus, critical proximal BS stenosis with question thrombus or irregular plaque. 2D echo with EF 55- 60% and no wall abnormality. 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 Moderate to severe dysarthria noted with dysphagia and patient placed on dysphagia 3, nectar liquids.  He was started on IV heparin for treatment of vasospasms and  transitioned to oral ASA+ Plavix on 8/6  As follow up CT head showed no significant changes. Dr. Leonie Man recommended monitoring for ETOH withdrawal as well as counseling to stop frequent cocaine use.   Therapy ongoing and patient with significant dysarthria and dysphagia as well as decline in mobility and ability to carry out ADL tasks. CIR recommended by MD and rehab team.    Patient transferred to CIR on 02/04/2017 .    Patient currently requires max- total A with basic self-care skills secondary to muscle weakness, decreased cardiorespiratoy endurance, abnormal tone, unbalanced muscle activation, motor apraxia, ataxia, decreased coordination and decreased motor planning, decreased attention, decreased awareness, decreased problem solving, decreased safety awareness, decreased memory and delayed processing and decreased sitting balance, decreased standing balance, decreased postural control, hemiplegia and decreased balance strategies.  Prior to hospitalization, patient could complete ADLs with modified independent .  Patient will benefit from skilled intervention to decrease level of assist with basic self-care skills and increase independence with basic self-care skills prior to discharge home with care partner.  Anticipate patient will require minimal  physical assistance and follow up home health.  OT - End of Session Activity Tolerance: Tolerates 10 - 20  min activity with multiple rests Endurance Deficit: Yes Endurance Deficit Description: Requires rest breaks throughout bathing/dressing session OT Assessment Rehab Potential (ACUTE ONLY): Fair OT Patient demonstrates impairments in the following area(s): Balance;Cognition;Endurance;Safety;Motor;Sensory OT Basic ADL's Functional Problem(s): Eating;Grooming;Bathing;Dressing;Toileting OT Transfers Functional Problem(s): Toilet;Tub/Shower OT Additional Impairment(s): Fuctional Use of Upper Extremity OT Plan OT Intensity: Minimum of 1-2 x/day, 45 to 90 minutes OT Frequency: 5 out of 7 days OT Duration/Estimated Length of Stay: 18-21 days OT Treatment/Interventions: Balance/vestibular training;Cognitive remediation/compensation;Discharge planning;Disease mangement/prevention;DME/adaptive equipment instruction;Functional electrical stimulation;Functional mobility training;Neuromuscular re-education;Pain management;Patient/family education;Psychosocial support;Self Care/advanced ADL retraining;Skin care/wound managment;Splinting/orthotics;Therapeutic Activities;Therapeutic Exercise;UE/LE Strength taining/ROM;UE/LE Coordination activities;Wheelchair propulsion/positioning OT Self Feeding Anticipated Outcome(s): Supervision/ set-up OT Basic Self-Care Anticipated Outcome(s): Min A OT Toileting Anticipated Outcome(s): Min A OT Bathroom Transfers Anticipated Outcome(s): Min A OT Recommendation Patient destination: Home Follow Up Recommendations: Home health OT;24 hour supervision/assistance Equipment Recommended: To be determined   Skilled Therapeutic Intervention Pt seen for OT eval and ADL bathing/dressing session. Pt sitting up in bed upon arrival with NT present providing supervision for meal, hand off to OT. Pt required mod cuing for oral pocketing on R side, able to clear independently following cuing.  He transferred to EOB with min A and multimodal cuing. Completed stand pivot transfers  throughout session with max A for lift/lower and max cuing for safety awareness and sequencing. Bathing/dressing completed from w/c level at sink. Pt able to attend to all body parts during bathing task and max A hand over hand assist provided to use R UE to bathe L arm. Pt completed toilet transfer and toileting task with total A due to decreased standing balance. He was impulsive throughout session, requiring cuing throughout for safety awareness. Pt left seated in w/c at end of session, QRB donned, lap tray in place, and NT entering room.    OT Evaluation Precautions/Restrictions  Precautions Precautions: Fall Restrictions Weight Bearing Restrictions: No Other Position/Activity Restrictions: Dysarthria from prior stroke with prior R sided weakness General Chart Reviewed: Yes Additional Pertinent History: hx CVA with resulting dysarthria Pain   No/ denies pain Home Living/Prior Functioning Home Living Available Help at Discharge: Family (Plans to live with son who can provide 24 hr physical/ cognitive assist) Type of Home: Apartment Additional Comments: Unable to obtain due to severe dysarthria, no family present at eval  Lives With: Alone Prior Function Vocation: Retired Surveyor, mining Baseline Vision/History: No visual deficits Patient Visual Report: Blurring of vision (Pt reports bluring of vision, unable to formally assess due to cognition and dyarthria) Vision Assessment?: No apparent visual deficits;Vision impaired- to be further tested in functional context Additional Comments: Unable to formally assess, does not appear to impact function Perception  Perception: Within Functional Limits Praxis Praxis: Intact Cognition Overall Cognitive Status: Impaired/Different from baseline Arousal/Alertness: Awake/alert Orientation Level: Nonverbal/unable to assess (Unable to assess due to dysarthria) Year: 2018 Month: August Day of Week: Other (Comment) (Unable to assess due to  dysarthria) Memory: Impaired Memory Impairment: Decreased recall of new information Immediate Memory Recall:  (Unable to assess due to dysarthria) Attention: Sustained Sustained Attention: Impaired Sustained Attention Impairment: Functional basic Awareness: Impaired Awareness Impairment: Intellectual impairment Problem Solving: Impaired Problem Solving Impairment: Verbal basic;Functional basic Executive Function:  (All areas impaired due to lower level deficits) Behaviors: Impulsive Safety/Judgment: Impaired Comments: Impulsive with decreased safety awareness and awareness of deficits Sensation Sensation Additional Comments: unable to formally assess due to  cognition/dyarthria Coordination Gross Motor Movements are Fluid and Coordinated: No Fine Motor Movements are Fluid and Coordinated: No Coordination and Movement Description: R hemiplegia Finger Nose Finger Test: WFL L UE, unable to perform R UE Motor  Motor Motor: Motor apraxia;Abnormal tone;Hemiplegia;Ataxia Motor - Skilled Clinical Observations: Posterior lean; R hemiplegia Mobility     Trunk/Postural Assessment  Cervical Assessment Cervical Assessment: Exceptions to [facility] (Forward head) Thoracic Assessment Thoracic Assessment: Exceptions to [facility] (Kyphotic) Lumbar Assessment Lumbar Assessment: Exceptions to [facility] (Posterior pelvic tilt) Postural Control Postural Control: Deficits on evaluation (Posterior and R lean; mild pushing tendencies noted in sitting)  Balance Balance Balance Assessed: Yes Static Sitting Balance Static Sitting - Balance Support: Feet supported;Left upper extremity supported Static Sitting - Level of Assistance: 4: Min assist;3: Mod assist Static Sitting - Comment/# of Minutes: Sitting EOB Dynamic Sitting Balance Dynamic Sitting - Balance Support: During functional activity;Feet supported Dynamic Sitting - Level of Assistance: 4: Min assist Sitting balance - Comments: Sitting on toilet Static  Standing Balance Static Standing - Balance Support: Left upper extremity supported Static Standing - Level of Assistance: 3: Mod assist;2: Max assist Static Standing - Comment/# of Minutes: Standing at sink Dynamic Standing Balance Dynamic Standing - Balance Support: During functional activity;Left upper extremity supported Dynamic Standing - Level of Assistance: 2: Max assist;1: +1 Total assist Dynamic Standing - Comments: During LB bathing/dressing and toileting tasks Extremity/Trunk Assessment RUE Assessment RUE Assessment: Exceptions to [facility] (2-/5 elbow flexion/extention; minimal gross grasp; trace shoulder flexion) RUE AROM (degrees) Overall AROM Right Upper Extremity: Deficits RUE Overall AROM Comments: Pt unable to isolate significant movement with R UE.  Only able to passively elevate R UE to 90 degrees.  R elbow grossly limited by 15 degrees extension passively RUE Tone RUE Tone: Moderate LUE Assessment LUE Assessment: Within Functional Limits   See Function Navigator for Current Functional Status.   Refer to Care Plan for Long Term Goals  Recommendations for other services: None    Discharge Criteria: Patient will be discharged from OT if patient refuses treatment 3 consecutive times without medical reason, if treatment goals not met, if there is a change in medical status, if patient makes no progress towards goals or if patient is discharged from hospital.  The above assessment, treatment plan, treatment alternatives and goals were discussed and mutually agreed upon: by patient  Ernestina Patches 02/05/2017, 5:55 PM

## 2017-02-05 NOTE — Progress Notes (Signed)
Patient safety maintained. Patient rested well during the night.  No needs/concerns voiced at this time.

## 2017-02-05 NOTE — Progress Notes (Signed)
Marcello FennelPatel, Ankit Anil, MD Physician Signed Physical Medicine and Rehabilitation  Consult Note Date of Service: 02/02/2017 9:36 AM  Related encounter: ED to Hosp-Admission (Discharged) [facility]     Expand All Collapse All   [] Hide copied text [] Hover for attribution information      Physical Medicine and Rehabilitation Consult  Reason for Consult: Right sided weakness and difficulty speaking Referring Physician: Neuro MD    HPI: Brandon Howard is a 76 y.o. 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 History taken from chart review. CT head with concerns of left hemipons acute/subacute infarct and encephalomalacia right cerebellar hemisphere. 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  MRI reviewed, showing bilateral infarcts. Per report and MRA brain, acute infarcts in left > right pons, bilateral cerebellum and right thalamus, critical proximal BS stenosis with question thrombus or irregular plaque. 2D echo with EF 55- 60% and no wall abnormality. 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 IV heparin X one week followed by repeat CTA to monitor for resolution of vasospasms. Moderate to severe dysarthria noted with dysphagia and patient placed on dysphagia 3, nectar liquids. PT evaluation done revealing deficits in mobility with RLE weakness and CIR recommended for follow up therapy.   Review of Systems  Eyes:       Indicating visual changes--question double vision (closing left eye)  Respiratory: Negative for shortness of breath.   Cardiovascular: Negative for  chest pain.  Gastrointestinal: Negative for abdominal pain.  Neurological: Positive for speech change, focal weakness and weakness. Negative for sensory change.  All other systems reviewed and are negative.         Past Medical History:  Diagnosis Date  . Gout   . Hepatitis   . Hypertension   . Pancreatitis   . Right knee pain   . Stroke (HCC)   . Vitamin B 12 deficiency          Past Surgical History:  Procedure Laterality Date  . CHOLECYSTECTOMY    . CORONARY ARTERY BYPASS GRAFT  2005          Family History  Problem Relation Age of Onset  . Throat cancer Mother   . COPD Father      Social History:  L[place] alone in [place] [facility]. He indicated that he was independent PTA and has assistance occasionally with housework.  He  reports that he has never smoked. He used to chew tobacco. He reports that he drinks alcohol occasionally. He uses cocaine occasionally.     Allergies: No Known Allergies          Medications Prior to Admission  Medication Sig Dispense Refill  . allopurinol (ZYLOPRIM) 100 MG tablet Take 100 mg by mouth daily.  0  . amLODipine (NORVASC) 10 MG tablet Take 10 mg by mouth daily.  0  . clopidogrel (PLAVIX) 75 MG tablet Take 75 mg by mouth daily.  0  . lisinopril (PRINIVIL,ZESTRIL) 20 MG tablet Take 20 mg by mouth daily.  0  . simvastatin (ZOCOR) 40 MG tablet Take 40 mg by mouth every evening.  0  Home: Home Living Family/patient expects to be discharged to:: Unsure Living Arrangements: Alone Additional Comments: pt difficult to understand, therefore full history not obtained  Lives With: Alone  Functional History: Functional Status:  Mobility: Bed Mobility Overal bed mobility: Needs Assistance Bed Mobility: Supine to Sit, Sit to Supine Supine to sit: Min assist Sit to supine: Min assist General bed mobility comments: Assist with bed pad to complete scooting to EOB.  Transfers Overall transfer level:  Needs assistance Equipment used: Rolling walker (2 wheeled) Transfers: Sit to/from Stand Sit to Stand: +2 safety/equipment, Mod assist General transfer comment: VC's for hand placement on seated surface for safety. Pt was able to power up to full stand with +2 assist for safety.  Ambulation/Gait Ambulation/Gait assistance: Mod assist, +2 physical assistance Ambulation Distance (Feet): 25 Feet Assistive device: Rolling walker (2 wheeled) Gait Pattern/deviations: Step-to pattern, Decreased step length - right, Decreased dorsiflexion - right, Steppage, Ataxic, Narrow base of support General Gait Details: VC's for improved posture, balance support, and weight shift. +2 for chair follow and line management.  Gait velocity: Decreased Gait velocity interpretation: Below normal speed for age/gender  ADL:  Cognition: Cognition Overall Cognitive Status: Difficult to assess Arousal/Alertness: Awake/alert Orientation Level: Oriented to person, Oriented to place, Oriented to time, Oriented to situation (missed year, knows month) Attention: Sustained Sustained Attention: Impaired Sustained Attention Impairment: Functional basic Memory: Impaired Memory Impairment: Decreased recall of new information (3/3 immediate recall  1/3 delayed recall) Awareness: Appears intact Problem Solving: Impaired Problem Solving Impairment: Verbal basic Safety/Judgment: Appears intact Cognition Arousal/Alertness: Awake/alert Behavior During Therapy: Flat affect Overall Cognitive Status: Difficult to assess Difficult to assess due to: Impaired communication   Blood pressure (!) 143/76, pulse 87, temperature 97.6 F (36.4 C), temperature source Oral, resp. rate 13, height 5\' 7"  (1.702 m), weight 70.7 kg (155 lb 13.8 oz), SpO2 98 %. Physical Exam  Nursing note and vitals reviewed. Constitutional: He is oriented to person, place, and time. He appears well-developed and well-nourished. No distress.  HENT:    Head: Normocephalic and atraumatic.  Eyes: Pupils are equal, round, and reactive to light. Conjunctivae and EOM are normal.  Neck: Normal range of motion. Neck supple.  Cardiovascular: Normal rate and regular rhythm.   Respiratory: Effort normal and breath sounds normal. No stridor.  GI: Soft. Bowel sounds are normal. He exhibits no distension.  Musculoskeletal: He exhibits no edema or tenderness.  Healing abrasion right knee.   Neurological: He is alert and oriented to person, place, and time. A cranial nerve deficit is present.  Right facial droop with dysarthria.  Able to follow simple one and two step motor commands. Motor: RUE: 3/5 proximal to distal LLE/LUE: 5/5 proximal to distal RLE: 4/5 proximal to distal Sensation intact to light touch DTRs symmetric Right spastic hemiparesis.   Skin: Skin is warm and dry. He is not diaphoretic.  Psychiatric: He has a normal mood and affect. His behavior is normal. His speech is slurred.    Lab Results Last 24 Hours       Results for orders placed or performed during the hospital encounter of 01/31/17 (from the past 24 hour(s))  Glucose, capillary     Status: Abnormal   Collection Time: 02/01/17 12:08 PM  Result Value Ref Range   Glucose-Capillary 102 (H) 65 - 99 mg/dL  Glucose, capillary     Status: Abnormal   Collection Time: 02/01/17  7:16 PM  Result Value Ref Range   Glucose-Capillary 179 (H) 65 - 99  mg/dL  Glucose, capillary     Status: Abnormal   Collection Time: 02/01/17 11:10 PM  Result Value Ref Range   Glucose-Capillary 238 (H) 65 - 99 mg/dL  Heparin level (unfractionated)     Status: Abnormal   Collection Time: 02/02/17  2:43 AM  Result Value Ref Range   Heparin Unfractionated 0.17 (L) 0.30 - 0.70 IU/mL  CBC     Status: None   Collection Time: 02/02/17  2:43 AM  Result Value Ref Range   WBC 6.9 4.0 - 10.5 K/uL   RBC 4.94 4.22 - 5.81 MIL/uL   Hemoglobin 15.0 13.0 - 17.0 g/dL   HCT 16.1 09.6 - 04.5 %    MCV 90.3 78.0 - 100.0 fL   MCH 30.4 26.0 - 34.0 pg   MCHC 33.6 30.0 - 36.0 g/dL   RDW 40.9 81.1 - 91.4 %   Platelets 231 150 - 400 K/uL  Basic metabolic panel     Status: Abnormal   Collection Time: 02/02/17  2:43 AM  Result Value Ref Range   Sodium 140 135 - 145 mmol/L   Potassium 3.5 3.5 - 5.1 mmol/L   Chloride 109 101 - 111 mmol/L   CO2 24 22 - 32 mmol/L   Glucose, Bld 118 (H) 65 - 99 mg/dL   BUN 12 6 - 20 mg/dL   Creatinine, Ser 7.82 0.61 - 1.24 mg/dL   Calcium 8.4 (L) 8.9 - 10.3 mg/dL   GFR calc non Af Amer >60 >60 mL/min   GFR calc Af Amer >60 >60 mL/min   Anion gap 7 5 - 15  Glucose, capillary     Status: Abnormal   Collection Time: 02/02/17  3:12 AM  Result Value Ref Range   Glucose-Capillary 105 (H) 65 - 99 mg/dL  Glucose, capillary     Status: None   Collection Time: 02/02/17  7:42 AM  Result Value Ref Range   Glucose-Capillary 98 65 - 99 mg/dL   Comment 1 Notify RN    Comment 2 Document in Chart       Imaging Results (Last 48 hours)  Ct Angio Head W Or Wo Contrast  Result Date: 01/31/2017 CLINICAL DATA:  76 year old male with right side weakness and slurred speech for several days. Acute posterior circulation infarcts with very severe basilar artery stenosis on MRI and MRA today. EXAM: CT ANGIOGRAPHY HEAD AND NECK TECHNIQUE: Multidetector CT imaging of the head and neck was performed using the standard protocol during bolus administration of intravenous contrast. Multiplanar CT image reconstructions and MIPs were obtained to evaluate the vascular anatomy. Carotid stenosis measurements (when applicable) are obtained utilizing NASCET criteria, using the distal internal carotid diameter as the denominator. CONTRAST:  50 mL Isovue 370 COMPARISON:  Brain MRI, head and neck MRA 1206 hours today, and earlier FINDINGS: CTA NECK Skeleton: Prior sternotomy. Superior cervical spine degeneration including multilevel spondylolisthesis, and chronic C4-C5  ankylosis. Partially visible thoracic spine degeneration. No acute osseous abnormality identified. Visualized paranasal sinuses and mastoids are stable and well pneumatized. Upper chest: Partially visible cardiomegaly and prior CABG. No superior mediastinal lymphadenopathy. Dependent pulmonary atelectasis. Other neck: Negative.  No cervical lymphadenopathy. Aortic arch: Mild to moderate aortic arch soft and calcified plaque. Three vessel arch configuration. Right carotid system: No brachiocephalic or right CCA origin stenosis. Soft plaque along the anterior wall of the mid right CCA without stenosis. Bulky calcified plaque at the right carotid bifurcation with soft and calcified plaque extending into the right ICA origin and  bulb, but less than 50 % stenosis with respect to the distal vessel (series 9, image 74). Left carotid system: No left CCA origin stenosis despite soft and calcified plaque. Tortuous left CCA, with a kinked appearance at the anterior left thoracic inlet (series 5, image 139). Mild anterior wall soft and calcified plaque proximal to the left carotid bifurcation with no associated stenosis. Bulky calcified plaque at the left ICA origin and bulb, but stenosis is less than 50 % with respect to the distal vessel. Superimposed soft plaque in the distal left ICA bulb beyond which there is tortuosity of the vessel with a mildly kinked appearance. Soft and calcified plaque of the distal left ICA just below the skullbase without stenosis. Vertebral arteries: No proximal right subclavian artery stenosis despite plaque. Calcified plaque at the right vertebral artery origin with mild to moderate stenosis (series 8, image 140). No other right vertebral artery stenosis to the skullbase. No proximal left subclavian artery stenosis despite plaque. Calcified plaque near the left vertebral artery origin, but no origin stenosis (series 8, image 138). Tortuous left P1 segment with soft plaque but no stenosis.  Occasional left V2 segment calcified plaque. No left vertebral artery stenosis to the skullbase. CTA HEAD Posterior circulation: Fairly extensive bilateral vertebral artery V4 segment plaque and stenosis, worse on the left. Moderate multifocal left V4 and mild to moderate right V4 segment stenoses. Still, the PICA origins and vertebrobasilar junction remain patent. However, there is short segment occlusion of the proximal basilar artery about 5 mm distal to the vertebrobasilar junction (series 11, images 25 and 26). Flow is reconstituted in the distal basilar which is diminutive and irregular. The SCA origins remain patent. There is a fetal left PCA origin. The right P1 segment is patent but irregular, with moderate to severe stenosis. Right posterior communicating artery is diminutive or absent. Left PCA branches are within normal limits, but there is poor flow in the right PCA throughout its course. Anterior circulation: Both ICA siphons are patent with extensive calcified plaque. On the left there is mild to moderate precavernous and cavernous segment stenosis, with moderate left supraclinoid stenosis. Normal left ophthalmic and posterior communicating artery origins. On the right there is mild cavernous segment stenosis. Normal right ophthalmic artery origin. Patent carotid termini. Normal MCA and ACA origins. Anterior communicating artery and bilateral ACA branches are normal. Left MCA M1 segment, bifurcation, and proximal left MCA branches are within normal limits. Mild irregularity of some left M3 branches. Right MCA M1 segment, bifurcation, and right MCA branches are within normal limits. Venous sinuses: Patent. Anatomic variants: Fetal left PCA origin. Delayed phase: No abnormal enhancement identified. Stable gray-white matter differentiation throughout the brain. no intracranial mass effect. Review of the MIP images confirms the above findings IMPRESSION: 1. Severe posterior circulation atherosclerosis.  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 Reconstituted flow in the distal Basilar, but proximal right PCA stenosis and poor flow throughout the right PCA. 2. Widespread and occasionally bulky plaque in both carotid arteries including the ICA siphons, but no hemodynamically significant carotid stenosis in the neck. There is up to moderate stenosis of the left ICA siphon. 3. Otherwise negative anterior circulation. 4. Stable CT appearance of the brain since 1015 hours today. Electronically Signed   By: Odessa Fleming M.D.   On: 01/31/2017 15:37   Ct Angio Neck W Or Wo Contrast  Result Date: 01/31/2017 CLINICAL DATA:  76 year old  male with right side weakness and slurred speech for several days. Acute posterior circulation infarcts with very severe basilar artery stenosis on MRI and MRA today. EXAM: CT ANGIOGRAPHY HEAD AND NECK TECHNIQUE: Multidetector CT imaging of the head and neck was performed using the standard protocol during bolus administration of intravenous contrast. Multiplanar CT image reconstructions and MIPs were obtained to evaluate the vascular anatomy. Carotid stenosis measurements (when applicable) are obtained utilizing NASCET criteria, using the distal internal carotid diameter as the denominator. CONTRAST:  50 mL Isovue 370 COMPARISON:  Brain MRI, head and neck MRA 1206 hours today, and earlier FINDINGS: CTA NECK Skeleton: Prior sternotomy. Superior cervical spine degeneration including multilevel spondylolisthesis, and chronic C4-C5 ankylosis. Partially visible thoracic spine degeneration. No acute osseous abnormality identified. Visualized paranasal sinuses and mastoids are stable and well pneumatized. Upper chest: Partially visible cardiomegaly and prior CABG. No superior mediastinal lymphadenopathy. Dependent pulmonary atelectasis. Other neck: Negative.  No cervical lymphadenopathy. Aortic arch:  Mild to moderate aortic arch soft and calcified plaque. Three vessel arch configuration. Right carotid system: No brachiocephalic or right CCA origin stenosis. Soft plaque along the anterior wall of the mid right CCA without stenosis. Bulky calcified plaque at the right carotid bifurcation with soft and calcified plaque extending into the right ICA origin and bulb, but less than 50 % stenosis with respect to the distal vessel (series 9, image 74). Left carotid system: No left CCA origin stenosis despite soft and calcified plaque. Tortuous left CCA, with a kinked appearance at the anterior left thoracic inlet (series 5, image 139). Mild anterior wall soft and calcified plaque proximal to the left carotid bifurcation with no associated stenosis. Bulky calcified plaque at the left ICA origin and bulb, but stenosis is less than 50 % with respect to the distal vessel. Superimposed soft plaque in the distal left ICA bulb beyond which there is tortuosity of the vessel with a mildly kinked appearance. Soft and calcified plaque of the distal left ICA just below the skullbase without stenosis. Vertebral arteries: No proximal right subclavian artery stenosis despite plaque. Calcified plaque at the right vertebral artery origin with mild to moderate stenosis (series 8, image 140). No other right vertebral artery stenosis to the skullbase. No proximal left subclavian artery stenosis despite plaque. Calcified plaque near the left vertebral artery origin, but no origin stenosis (series 8, image 138). Tortuous left P1 segment with soft plaque but no stenosis. Occasional left V2 segment calcified plaque. No left vertebral artery stenosis to the skullbase. CTA HEAD Posterior circulation: Fairly extensive bilateral vertebral artery V4 segment plaque and stenosis, worse on the left. Moderate multifocal left V4 and mild to moderate right V4 segment stenoses. Still, the PICA origins and vertebrobasilar junction remain patent. However,  there is short segment occlusion of the proximal basilar artery about 5 mm distal to the vertebrobasilar junction (series 11, images 25 and 26). Flow is reconstituted in the distal basilar which is diminutive and irregular. The SCA origins remain patent. There is a fetal left PCA origin. The right P1 segment is patent but irregular, with moderate to severe stenosis. Right posterior communicating artery is diminutive or absent. Left PCA branches are within normal limits, but there is poor flow in the right PCA throughout its course. Anterior circulation: Both ICA siphons are patent with extensive calcified plaque. On the left there is mild to moderate precavernous and cavernous segment stenosis, with moderate left supraclinoid stenosis. Normal left ophthalmic and posterior communicating artery origins. On the right there  is mild cavernous segment stenosis. Normal right ophthalmic artery origin. Patent carotid termini. Normal MCA and ACA origins. Anterior communicating artery and bilateral ACA branches are normal. Left MCA M1 segment, bifurcation, and proximal left MCA branches are within normal limits. Mild irregularity of some left M3 branches. Right MCA M1 segment, bifurcation, and right MCA branches are within normal limits. Venous sinuses: Patent. Anatomic variants: Fetal left PCA origin. Delayed phase: No abnormal enhancement identified. Stable gray-white matter differentiation throughout the brain. no intracranial mass effect. Review of the MIP images confirms the above findings IMPRESSION: 1. Severe posterior circulation atherosclerosis. 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 Reconstituted flow in the distal Basilar, but proximal right PCA stenosis and poor flow throughout the right PCA. 2. Widespread and occasionally bulky plaque in both carotid arteries including the ICA siphons, but no hemodynamically  significant carotid stenosis in the neck. There is up to moderate stenosis of the left ICA siphon. 3. Otherwise negative anterior circulation. 4. Stable CT appearance of the brain since 1015 hours today. Electronically Signed   By: Odessa Fleming M.D.   On: 01/31/2017 15:37   Mr Maxine Glenn Neck Wo Contrast  Result Date: 01/31/2017 CLINICAL DATA:  Right-sided weakness and slurred speech. EXAM: MRI HEAD WITHOUT CONTRAST MRA HEAD WITHOUT CONTRAST MRA NECK WITHOUT CONTRAST TECHNIQUE: Multiplanar, multiecho pulse sequences of the brain and surrounding structures were obtained without intravenous contrast. Angiographic images of the Circle of Willis were obtained using MRA technique without intravenous contrast. Angiographic images of the neck were obtained using MRA technique without intravenous contrast. Carotid stenosis measurements (when applicable) are obtained utilizing NASCET criteria, using the distal internal carotid diameter as the denominator. COMPARISON:  10/29/2008 brain MRI.  Head CT from earlier today FINDINGS: MRI HEAD FINDINGS Brain: Confluent acute infarct in the left paramedian pons and to a lesser degree in the right paramedian pons. Clustered small acute infarcts in the bilateral peripheral cerebellum. Acute lacunar infarct in the right thalamus. No acute noted anterior circulation infarcts. There is extensive remote right cerebellar infarction, mainly affecting the upper hemisphere and peduncles. Chronic microvascular ischemia in the cerebral white matter, confluent around the lateral ventricles. No acute hemorrhage, hydrocephalus, or masslike findings. Single remote microhemorrhage in the central right cerebellum. Remote lacunar infarct in the left genu of corpus callosum. Vascular: There is a blurred appearance of the mid basilar on T1 and T2 weighted imaging. See below. Skull and upper cervical spine: Cervical facet arthropathy. Negative for marrow lesion. Sinuses/Orbits: Negative MRA HEAD FINDINGS There  are symmetric carotid arteries. Fetal type left PCA. Mild anterior circulation atheromatous changes. Symmetric atheromatous vertebral arteries. In the proximal and mid basilar is critical stenosis with 2 flow gaps. The abnormality has a central luminal appearance concerning for thrombus or irregular/ulcerated plaque. There is no right posterior communicating artery. Diminutive flow seen in the distal basilar and in the regular right PCA. Normal flow in the left PCA which is served by posterior communicating artery. No evidence of intramural hematoma on conventional brain MRI to suggest intracranial dissection. MRA NECK FINDINGS Limited noncontrast neck MRA. There is atherosclerosis of the bifurcations with at least moderate (50%) left ICA stenosis at the bulb. The visible aorta arch is normal caliber. There is antegrade flow in both vertebral arteries. The right vertebral artery ostium was not covered. Critical Value/emergent results were called by telephone at the time of interpretation on 01/31/2017 at 1:33 pm to Dr.  PHILLIP HOBBS, who verbally acknowledged these results. IMPRESSION: 1. 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 The narrowing is irregular and fairly long and could be thrombus and or irregular plaque. Diminished flow in the distal basilar and irregular right PCA. Left PCA is fetal type. 2. Acute infarcts in the left more than right pons, bilateral cerebellum, and right thalamus. 3. Remote infarcts in the upper right cerebellum and left corpus callosum genu. 4. Patent carotid and vertebral arteries in the neck. 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 Electronically Signed   By: Marnee Spring M.D.   On: 01/31/2017 13:42   Mr Brain Wo Contrast  Result Date: 01/31/2017 CLINICAL DATA:  Right-sided weakness and slurred speech. EXAM: MRI HEAD WITHOUT CONTRAST MRA HEAD WITHOUT CONTRAST MRA NECK WITHOUT CONTRAST TECHNIQUE: Multiplanar, multiecho pulse sequences of the brain and  surrounding structures were obtained without intravenous contrast. Angiographic images of the Circle of Willis were obtained using MRA technique without intravenous contrast. Angiographic images of the neck were obtained using MRA technique without intravenous contrast. Carotid stenosis measurements (when applicable) are obtained utilizing NASCET criteria, using the distal internal carotid diameter as the denominator. COMPARISON:  10/29/2008 brain MRI.  Head CT from earlier today FINDINGS: MRI HEAD FINDINGS Brain: Confluent acute infarct in the left paramedian pons and to a lesser degree in the right paramedian pons. Clustered small acute infarcts in the bilateral peripheral cerebellum. Acute lacunar infarct in the right thalamus. No acute noted anterior circulation infarcts. There is extensive remote right cerebellar infarction, mainly affecting the upper hemisphere and peduncles. Chronic microvascular ischemia in the cerebral white matter, confluent around the lateral ventricles. No acute hemorrhage, hydrocephalus, or masslike findings. Single remote microhemorrhage in the central right cerebellum. Remote lacunar infarct in the left genu of corpus callosum. Vascular: There is a blurred appearance of the mid basilar on T1 and T2 weighted imaging. See below. Skull and upper cervical spine: Cervical facet arthropathy. Negative for marrow lesion. Sinuses/Orbits: Negative MRA HEAD FINDINGS There are symmetric carotid arteries. Fetal type left PCA. Mild anterior circulation atheromatous changes. Symmetric atheromatous vertebral arteries. In the proximal and mid basilar is critical stenosis with 2 flow gaps. The abnormality has a central luminal appearance concerning for thrombus or irregular/ulcerated plaque. There is no right posterior communicating artery. Diminutive flow seen in the distal basilar and in the regular right PCA. Normal flow in the left PCA which is served by posterior communicating artery. No evidence  of intramural hematoma on conventional brain MRI to suggest intracranial dissection. MRA NECK FINDINGS Limited noncontrast neck MRA. There is atherosclerosis of the bifurcations with at least moderate (50%) left ICA stenosis at the bulb. The visible aorta arch is normal caliber. There is antegrade flow in both vertebral arteries. The right vertebral artery ostium was not covered. Critical Value/emergent results were called by telephone at the time of interpretation on 01/31/2017 at 1:33 pm to Dr. Aneta Mins HOBBS, who verbally acknowledged these results. IMPRESSION: 1. 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 The narrowing is irregular and fairly long and could be thrombus and or irregular plaque. Diminished flow in the distal basilar and irregular right PCA. Left PCA is fetal type. 2. Acute infarcts in the left more than right pons, bilateral cerebellum, and right thalamus. 3. Remote infarcts in the upper right cerebellum and left corpus callosum genu. 4. Patent carotid and vertebral arteries in the neck. 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 Electronically Signed   By: Marja Kays  Watts M.D.   On: 01/31/2017 13:42   Dg Swallowing Func-speech Pathology  Result Date: 02/01/2017 Objective Swallowing Evaluation: Type of Study: MBS-Modified Barium Swallow Study Patient Details Name: HUSAIN COSTABILE MRN: [id number] Date of Birth: 1940/07/05 Today's Date: 02/01/2017 Time: SLP Start Time (ACUTE ONLY): 1045-SLP Stop Time (ACUTE ONLY): 1109 SLP Time Calculation (min) (ACUTE ONLY): 24 min Past Medical History: Past Medical History: Diagnosis Date . Hypertension  . Stroke [facility])  Past Surgical History: No past surgical history on file. HPI: KROY SPRUNG a 76 y.o.malepast medical history significant for hypertension and stroke. Presents emergency room with right-sided weakness and slurred speech. Patient states symptoms started yesterday. Did not call for assistance until today. Difficult walking. EMS  activated. Brought patient to the hospital for evaluation. Patient symptoms have not worsened or improved. ED course: CT of the head revealed new stroke. MRI of head showinginfracrts left worse then right pons, bilateral cerebellum and right thalamus as well as remote infarcts in right cerebellum and left corpus collosum. Subjective: The patient was seen in radiology for MBS to determine current swallowing physiology.  Assessment / Plan / Recommendation CHL IP CLINICAL IMPRESSIONS 02/01/2017 Clinical Impression MBS was completed using thin liquids, nectar thick liquids, pureed material and dual textured solids.  The patient presented with oropharyngeal dysphagia.  The oral phase was characterized by adequate but delayed mastication of dual textured solids, oral residue given pureed material and dual textured solids and premature loss of the bolus seen across most textures.  In addition, decreased labial seal leads to anterior escape mostly given thin liquids.  The pharyngeal phase of the swallow was characterized by a swallow trigger in the pyriform sinuses given thin and nectar thick liquids, a swallow trigger in the vallecula given pureed material and dual textured solids, mildly decreased laryngeal vestibule closure and mild pyriform residue seen after the swallow which appeared to be secondary to premature closure of the UES.  Flash penetration was noted during the swallow given thin liquids via spoon sips.  Penetration into the laryngeal vestibule was seen during the swallowing given cup sips of thin liquids which the patient was unable to clear with a cued second swallow nor a cough and reswallow.  One episode of flash penetration was seen given spoon sips of nectar thick liquids during the swallow but was not replicated even give self fed cup sips.  Esophageal sweep did not reveal overt issues.  Recommend a dysphagia 3 diet with nectar thick liquids.  The patient should take 1 sip at a time and not use straws.   Given the oral residue and oral weakness there is potential concern for oral residue given solids.  Instructed nursing to downgrade his diet to dysphagia 1 if issues are noted with pocketing/oral residue.  ST will follow during acute stay.  The patient would also benefit from ST follow up at the next level of care.   SLP Visit Diagnosis Dysphagia, oropharyngeal phase (R13.12) Attention and concentration deficit following -- Frontal lobe and executive function deficit following -- Impact on safety and function Mild aspiration risk   CHL IP TREATMENT RECOMMENDATION 02/01/2017 Treatment Recommendations Therapy as outlined in treatment plan below   Prognosis 02/01/2017 Prognosis for Safe Diet Advancement Fair Barriers to Reach Goals -- Barriers/Prognosis Comment -- CHL IP DIET RECOMMENDATION 02/01/2017 SLP Diet Recommendations Dysphagia 3 (Mech soft) solids;Nectar thick liquid Liquid Administration via Cup Medication Administration Crushed with puree Compensations Slow rate;Small sips/bites Postural Changes Remain semi-upright after after feeds/meals (Comment);Seated upright  at 90 degrees   CHL IP OTHER RECOMMENDATIONS 02/01/2017 Recommended Consults -- Oral Care Recommendations Oral care before and after PO;Oral care BID Other Recommendations Order thickener from pharmacy;Prohibited food (jello, ice cream, thin soups)   CHL IP FOLLOW UP RECOMMENDATIONS 02/01/2017 Follow up Recommendations Other (comment)   CHL IP FREQUENCY AND DURATION 02/01/2017 Speech Therapy Frequency (ACUTE ONLY) min 2x/week Treatment Duration 2 weeks      CHL IP ORAL PHASE 02/01/2017 Oral Phase Impaired Oral - Pudding Teaspoon -- Oral - Pudding Cup -- Oral - Honey Teaspoon -- Oral - Honey Cup -- Oral - Nectar Teaspoon -- Oral - Nectar Cup -- Oral - Nectar Straw -- Oral - Thin Teaspoon Right anterior bolus loss Oral - Thin Cup Right anterior bolus loss Oral - Thin Straw -- Oral - Puree Delayed oral transit;Premature spillage Oral - Mech Soft -- Oral - Regular --  Oral - Multi-Consistency Delayed oral transit;Premature spillage Oral - Pill -- Oral Phase - Comment --  CHL IP PHARYNGEAL PHASE 02/01/2017 Pharyngeal Phase Impaired Pharyngeal- Pudding Teaspoon -- Pharyngeal -- Pharyngeal- Pudding Cup -- Pharyngeal -- Pharyngeal- Honey Teaspoon -- Pharyngeal -- Pharyngeal- Honey Cup -- Pharyngeal -- Pharyngeal- Nectar Teaspoon Delayed swallow initiation-pyriform sinuses;Penetration/Aspiration during swallow Pharyngeal Material enters airway, remains ABOVE vocal cords then ejected out Pharyngeal- Nectar Cup Delayed swallow initiation-pyriform sinuses;Pharyngeal residue - pyriform Pharyngeal -- Pharyngeal- Nectar Straw -- Pharyngeal -- Pharyngeal- Thin Teaspoon Delayed swallow initiation-pyriform sinuses;Penetration/Aspiration during swallow Pharyngeal Material enters airway, remains ABOVE vocal cords then ejected out Pharyngeal- Thin Cup Delayed swallow initiation-pyriform sinuses;Penetration/Aspiration during swallow Pharyngeal Material enters airway, remains ABOVE vocal cords and not ejected out Pharyngeal- Thin Straw -- Pharyngeal -- Pharyngeal- Puree Delayed swallow initiation-vallecula;Pharyngeal residue - pyriform Pharyngeal -- Pharyngeal- Mechanical Soft -- Pharyngeal -- Pharyngeal- Regular -- Pharyngeal -- Pharyngeal- Multi-consistency Delayed swallow initiation-vallecula;Pharyngeal residue - pyriform Pharyngeal -- Pharyngeal- Pill -- Pharyngeal -- Pharyngeal Comment --  CHL IP CERVICAL ESOPHAGEAL PHASE 02/01/2017 Cervical Esophageal Phase WFL Pudding Teaspoon -- Pudding Cup -- Honey Teaspoon -- Honey Cup -- Nectar Teaspoon -- Nectar Cup -- Nectar Straw -- Thin Teaspoon -- Thin Cup -- Thin Straw -- Puree -- Mechanical Soft -- Regular -- Multi-consistency -- Pill -- Cervical Esophageal Comment -- No flowsheet data found. Dimas Aguas, MA, CCC-SLP Acute Rehab SLP [phone] [name] 02/01/2017, 11:24 AM              Mr Maxine Glenn Head Wo Contrast  Result Date:  01/31/2017 CLINICAL DATA:  Right-sided weakness and slurred speech. EXAM: MRI HEAD WITHOUT CONTRAST MRA HEAD WITHOUT CONTRAST MRA NECK WITHOUT CONTRAST TECHNIQUE: Multiplanar, multiecho pulse sequences of the brain and surrounding structures were obtained without intravenous contrast. Angiographic images of the Circle of Willis were obtained using MRA technique without intravenous contrast. Angiographic images of the neck were obtained using MRA technique without intravenous contrast. Carotid stenosis measurements (when applicable) are obtained utilizing NASCET criteria, using the distal internal carotid diameter as the denominator. COMPARISON:  10/29/2008 brain MRI.  Head CT from earlier today FINDINGS: MRI HEAD FINDINGS Brain: Confluent acute infarct in the left paramedian pons and to a lesser degree in the right paramedian pons. Clustered small acute infarcts in the bilateral peripheral cerebellum. Acute lacunar infarct in the right thalamus. No acute noted anterior circulation infarcts. There is extensive remote right cerebellar infarction, mainly affecting the upper hemisphere and peduncles. Chronic microvascular ischemia in the cerebral white matter, confluent around the lateral ventricles. No acute hemorrhage, hydrocephalus, or masslike findings. Single remote  microhemorrhage in the central right cerebellum. Remote lacunar infarct in the left genu of corpus callosum. Vascular: There is a blurred appearance of the mid basilar on T1 and T2 weighted imaging. See below. Skull and upper cervical spine: Cervical facet arthropathy. Negative for marrow lesion. Sinuses/Orbits: Negative MRA HEAD FINDINGS There are symmetric carotid arteries. Fetal type left PCA. Mild anterior circulation atheromatous changes. Symmetric atheromatous vertebral arteries. In the proximal and mid basilar is critical stenosis with 2 flow gaps. The abnormality has a central luminal appearance concerning for thrombus or irregular/ulcerated  plaque. There is no right posterior communicating artery. Diminutive flow seen in the distal basilar and in the regular right PCA. Normal flow in the left PCA which is served by posterior communicating artery. No evidence of intramural hematoma on conventional brain MRI to suggest intracranial dissection. MRA NECK FINDINGS Limited noncontrast neck MRA. There is atherosclerosis of the bifurcations with at least moderate (50%) left ICA stenosis at the bulb. The visible aorta arch is normal caliber. There is antegrade flow in both vertebral arteries. The right vertebral artery ostium was not covered. Critical Value/emergent results were called by telephone at the time of interpretation on 01/31/2017 at 1:33 pm to Dr. Aneta Mins HOBBS, who verbally acknowledged these results. IMPRESSION: 1. 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 The narrowing is irregular and fairly long and could be thrombus and or irregular plaque. Diminished flow in the distal basilar and irregular right PCA. Left PCA is fetal type. 2. Acute infarcts in the left more than right pons, bilateral cerebellum, and right thalamus. 3. Remote infarcts in the upper right cerebellum and left corpus callosum genu. 4. Patent carotid and vertebral arteries in the neck. 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 Electronically Signed   By: Marnee Spring M.D.   On: 01/31/2017 13:42     Assessment/Plan: Diagnosis: Bilateral CVA Labs and images independently reviewed.  Records reviewed and summated above. Stroke: Continue secondary stroke prophylaxis and Risk Factor Modification listed below:   Antiplatelet therapy:   Blood Pressure Management:  Continue current medication with prn's with permisive HTN per primary team Statin Agent:   Right sided hemiparesis: fit for orthosis to prevent contractures (resting hand splint for day, wrist cock up splint at night) Motor recovery: Fluoxetine  1. Does the need for close, 24 hr/day medical  supervision in concert with the patient's rehab needs make it unreasonable for this patient to be served in a less intensive setting? Yes  2. Co-Morbidities requiring supervision/potential complications: dysphagia (advance diet as tolerated), hyperglycemia (Monitor in accordance with exercise and adjust meds as necessary), dysarthria, cocaine abuse (counsel), HTN (monitor and provide prns in accordance with increased physical exertion and pain), CVA with residual RUE weakness (transition from heparin ggt when appropriate) 3. Due to bladder management, bowel management, safety, disease management, medication administration and patient education, does the patient require 24 hr/day rehab nursing? Yes 4. Does the patient require coordinated care of a physician, rehab nurse, PT (1-2 hrs/day, 5 days/week), OT (1-2 hrs/day, 5 days/week) and SLP (1-2 hrs/day, 5 days/week) to address physical and functional deficits in the context of the above medical diagnosis(es)? Yes Addressing deficits in the following areas: balance, endurance, locomotion, strength, transferring, bowel/bladder control, bathing, dressing, toileting, speech, swallowing and psychosocial support 5. Can the patient actively participate in an intensive therapy program of at least 3 hrs of therapy per day at least 5 days per week? Yes 6. The potential for patient to make measurable gains  while on inpatient rehab is excellent 7. Anticipated functional outcomes upon discharge from inpatient rehab are supervision and min assist  with PT, supervision and min assist with OT, modified independent and supervision with SLP. 8. Estimated rehab length of stay to reach the above functional goals is: 18-23 days. 9. Anticipated D/C setting: Other 10. Anticipated post D/C treatments: SNF 11. Overall Rehab/Functional Prognosis: good  RECOMMENDATIONS: This patient's condition is appropriate for continued rehabilitative care in the following setting:  CIR Patient has agreed to participate in recommended program. Yes Note that insurance prior authorization may be required for reimbursement for recommended care.  Comment: Rehab Admissions Coordinator to follow up.  Maryla Morrow, MD, Earlie Counts, [place] 02/02/2017    Revision History                        Routing History

## 2017-02-06 ENCOUNTER — Inpatient Hospital Stay (HOSPITAL_COMMUNITY): Payer: Medicare HMO | Admitting: Occupational Therapy

## 2017-02-06 ENCOUNTER — Inpatient Hospital Stay (HOSPITAL_COMMUNITY): Payer: Medicare HMO | Admitting: Speech Pathology

## 2017-02-06 ENCOUNTER — Inpatient Hospital Stay (HOSPITAL_COMMUNITY): Payer: Medicare HMO | Admitting: Physical Therapy

## 2017-02-06 ENCOUNTER — Inpatient Hospital Stay (HOSPITAL_COMMUNITY): Payer: Medicare HMO

## 2017-02-06 DIAGNOSIS — I69398 Other sequelae of cerebral infarction: Secondary | ICD-10-CM

## 2017-02-06 DIAGNOSIS — R269 Unspecified abnormalities of gait and mobility: Secondary | ICD-10-CM

## 2017-02-06 DIAGNOSIS — I25810 Atherosclerosis of coronary artery bypass graft(s) without angina pectoris: Secondary | ICD-10-CM

## 2017-02-06 NOTE — Progress Notes (Signed)
Speech Language Pathology Daily Session Note  Patient Details  Name: Brandon Howard Saline MRN: [id number] Date of Birth: 28-Dec-1940  Today's Date: 02/06/2017 SLP Individual Time: 1345-1430 SLP Individual Time Calculation (min): 45 min  Short Term Goals: Week 1: SLP Short Term Goal 1 (Week 1): Pt will communicate basic wants and needs via multimodal communication with Max A cues.  SLP Short Term Goal 2 (Week 1): Pt will utilize speech intelligibility strategies to achieve ~ 50% intelligibility at the word level with Max A cues.  SLP Short Term Goal 3 (Week 1): Pt will answer yes/no questions using multimodalities with ~ 50% accuracy and Mod A cues.  SLP Short Term Goal 4 (Week 1): Pt will consume dysphagia 3 and nectar thick liquids with minimal overt s/s of aspiration and Max A cues for use of compensatory swallow strategies.  SLP Short Term Goal 5 (Week 1): Pt will demonstrate overt s/s of aspiration with trials of ice chips to demonstrate readiness for repeat instrumental swallow study.   Skilled Therapeutic Interventions: Skilled treatment session focused on cognition goals. SLP facilitated session by offering thin water. Pt refused all attempts. Pt with attempts at speech intelligibility when naming objects. Pt with word approximation of ~ 75% when naming simple objects. Pt able to follow simple 2 step directions with Mod A. Pt able to gesture yes/no answers with ~ 75% accuracy. Pt left upright in recliner, safety belt in place, chair alarm and all needs within reach. Continue per current plan of care.      Function:    Cognition Comprehension Comprehension assist level: Understands basic 50 - 74% of the time/ requires cueing 25 - 49% of the time;Understands basic 25 - 49% of the time/ requires cueing 50 - 75% of the time  Expression   Expression assist level: Expresses basis less than 25% of the time/requires cueing >75% of the time.  Social Interaction Social Interaction assist level:  Interacts appropriately 50 - 74% of the time - May be physically or verbally inappropriate.  Problem Solving Problem solving assist level: Solves basic less than 25% of the time - needs direction nearly all the time or does not effectively solve problems and may need a restraint for safety  Memory Memory assist level: Recognizes or recalls less than 25% of the time/requires cueing greater than 75% of the time    Pain Pain Assessment Pain Assessment: No/denies pain  Therapy/Group: Individual Therapy  Junah Yam 02/06/2017, 3:16 PM

## 2017-02-06 NOTE — Progress Notes (Signed)
Social Work Assessment and Plan Social Work Assessment and Plan  Patient Details  Name: Brandon Howard MRN: [id number] Date of Birth: 1941/05/31  Today's Date: 02/06/2017  Problem List:  Patient Active Problem List   Diagnosis Date Noted  . Gait disturbance, post-stroke 02/06/2017  . Arterial ischemic stroke, vertebrobasilar, brainstem, acute, unspecified laterality (HCC) 02/04/2017  . Hyperlipidemia   . Polysubstance abuse   . 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 Right hemiparesis (HCC)   . Dysphagia, post-stroke   . History of CVA with residual deficit   . Hyperglycemia   . Benign essential HTN   . Dysarthria, post-stroke   . Cocaine abuse   . Stroke (cerebrum) (HCC) 01/31/2017   Past Medical History:  Past Medical History:  Diagnosis Date  . Gout   . Hepatitis   . Hypertension   . Pancreatitis   . Right knee pain   . Stroke (HCC)   . Vitamin B 12 deficiency    Past Surgical History:  Past Surgical History:  Procedure Laterality Date  . CHOLECYSTECTOMY    . CORONARY ARTERY BYPASS GRAFT  2005   Social History:  reports that he has never smoked. He has never used smokeless tobacco. He reports that he drinks alcohol. He reports that he uses drugs, including "Crack" cocaine.  Family / Support Systems Marital Status: Divorced Patient Roles: Parent, Other (Comment) (friend) Children: James-son [phone]-cell, tow other children out of town Other Supports: Radene Journey [phone] Anticipated Caregiver: Son and sister Ability/Limitations of Caregiver: Son works but can flex his schedule Caregiver Availability: Other (Comment) (Preparing for 24 hr care) Family Dynamics: Pt is close with Fayrene Fearing and his other two children he recently lost another child a month ago in a motorcycle accident. He has friends and nieghbors at [employer] who are supportive and will check in on him.  Social  History Preferred language: English Religion: None Cultural Background: No issues Education: High School Read: Yes Write: Yes Employment Status: Retired Fish farm manager Issues: No issues Guardian/Conservator: none-according to MD pt is not fully capable of making his own decisions at this time. Will look toward his son since no formal POA in place. Fayrene Fearing voiced he talks with his siblings and makes them aware of what is going on here.   Abuse/Neglect Physical Abuse: Denies Verbal Abuse: Denies Sexual Abuse: Denies Exploitation of patient/patient's resources: Denies Self-Neglect: Denies  Emotional Status Pt's affect, behavior adn adjustment status: Pt is motivated to do well he has been here before after other stroke in 2010. He has always been independent and able to take care of himself. He is not one to ask others for assist and wil not start now. Fayrene Fearing reports his Dad is hard headed and does it himself no matter what, if safe or not. Recent Psychosocial Issues: other health issues-history of stroke in 2010 and had residual right UE weakness as a result Pyschiatric History: Probable some depression over the loss of his son recently. Hard to understand pt due to dysarthia but he does try and gestures and does yes/no with accuracy. Will ask neuro-psych to see once better able to communicate for coping and substance abuse issues. Substance Abuse History: ETOH waves off this, but also positive for cocaine difficult to address with pt's communication issue, will continue to re-assess this and ask neuro-psych to see while here.  Patient / Family Perceptions, Expectations & Goals Pt/Family understanding of illness & functional limitations: Pt  points to his arm and leg and mouth for his speech. He seems to understand what has happened to him. Fayrene FearingJames reports he is aware of his deficits and knows he will need assistance when he leaves here. Pt does try to talk with MD when he is rounding  in the am. Premorbid pt/family roles/activities: Dad, grandfather, retiree, friend, etc Anticipated changes in roles/activities/participation: resume Pt/family expectations/goals: Pt points to arm and leg getting better, atleast leg so he can move himself. Fayrene FearingJames states: " I hope he does well here, we will do what we can for him."  [employer] Community Agencies: Other (Comment) (Lives at senior apartment-lunch provided) Premorbid Home Care/DME Agencies: None Transportation available at discharge: Son provides transport to his appointments Resource referrals recommended: Neuropsychology, Support group (specify)  Discharge Planning Living Arrangements: Alone Support Systems: Children, Other relatives, Friends/neighbors Type of Residence: Private residence Insurance Resources: Media plannerrivate Insurance (specify) Financial Resources: Restaurant manager, fast foodocial Security Financial Screen Referred: Previously completed Living Expenses: Rent Money Management: Patient Does the patient have any problems obtaining your medications?: No Home Management: Pt maintained his apartment Patient/Family Preliminary Plans: Unsure to return back to his apartment or to son's or sister's home. Pt will require care at discharge, son aware of this. Will work on the best plan and what works for the family also. Team feels will reach min assist level goals. With son workng usure who will be providiing the care Sw Barriers to Discharge: Decreased caregiver support Social Work Anticipated Follow Up Needs: HH/OP, SNF, Support Group  Clinical Impression Unfortuante gentleman who has suffered a severe stroke. He had residual weakness from his stroke in 2010 with his right arm and hand. He wants to return back to his apartment and be as independent as possible. Pt's son is supportive and willing to assist him unsure if able to provide 24 hr care, will ask pt's sister for help with this. Will address his substance abuse issue when able to  communicate better and have neuro-psych see also. Hope to get pt home but will see how much assist family can provide.    Lucy Chrisupree, Brody Kump G 02/06/2017, 12:42 PM

## 2017-02-06 NOTE — Discharge Instructions (Signed)
Inpatient Rehab Discharge Instructions  RAAD CLAYSON Discharge date and time:    Activities/Precautions/ Functional Status: Activity: no lifting, driving, or strenuous exercise for till cleared by MD Diet: cardiac diet Wound Care: none needed   Functional status:  ___ No restrictions     ___ Walk up steps independently ___ 24/7 supervision/assistance   ___ Walk up steps with assistance ___ Intermittent supervision/assistance  ___ Bathe/dress independently ___ Walk with walker     ___ Bathe/dress with assistance ___ Walk Independently    ___ Shower independently ___ Walk with assistance    ___ Shower with assistance ___ No alcohol     ___ Return to work/school ________  Special Instructions:   STROKE/TIA DISCHARGE INSTRUCTIONS SMOKING Cigarette smoking nearly doubles your risk of having a stroke & is the single most alterable risk factor  If you smoke or have smoked in the last 12 months, you are advised to quit smoking for your health.  Most of the excess cardiovascular risk related to smoking disappears within a year of stopping.  Ask you doctor about anti-smoking medications  [place] Quit Line: 1-800-QUIT NOW  Free Smoking Cessation Classes [phone]  CHOLESTEROL Know your levels; limit fat & cholesterol in your diet  Lipid Panel     Component Value Date/Time   CHOL 112 02/01/2017 0524   TRIG 67 02/01/2017 0524   HDL 43 02/01/2017 0524   CHOLHDL 2.6 02/01/2017 0524   VLDL 13 02/01/2017 0524   LDLCALC 56 02/01/2017 0524      Many patients benefit from treatment even if their cholesterol is at goal.  Goal: Total Cholesterol (CHOL) less than 160  Goal:  Triglycerides (TRIG) less than 150  Goal:  HDL greater than 40  Goal:  LDL (LDLCALC) less than 100   BLOOD PRESSURE American Stroke Association blood pressure target is less that 120/80 mm/Hg  Your discharge blood pressure is:  BP: 134/78  Monitor your blood pressure  Limit your salt and alcohol  intake  Many individuals will require more than one medication for high blood pressure  DIABETES (A1c is a blood sugar average for last 3 months) Goal HGBA1c is under 7% (HBGA1c is blood sugar average for last 3 months)  Diabetes: No known diagnosis of diabetes    Lab Results  Component Value Date   HGBA1C 5.3 02/01/2017     Your HGBA1c can be lowered with medications, healthy diet, and exercise.  Check your blood sugar as directed by your physician  Call your physician if you experience unexplained or low blood sugars.  PHYSICAL ACTIVITY/REHABILITATION Goal is 30 minutes at least 4 days per week  Activity: No driving, Therapies: see above Return to work: *N/A  Activity decreases your risk of heart attack and stroke and makes your heart stronger.  It helps control your weight and blood pressure; helps you relax and can improve your mood.  Participate in a regular exercise program.  Talk with your doctor about the best form of exercise for you (dancing, walking, swimming, cycling).  DIET/WEIGHT Goal is to maintain a healthy weight  Your discharge diet is: DIET DYS 3 Room service appropriate? Yes; Fluid consistency: Nectar Thick  liquids Your height is:  Height: 5\' 7"  (170.2 cm) Your current weight is: Weight: 69.5 kg (153 lb 3.5 oz) Your Body Mass Index (BMI) is:  BMI (Calculated): 24  Following the type of diet specifically designed for you will help prevent another stroke.  You are at goal weight  Your goal Body Mass Index (BMI) is 19-24.  Healthy food habits can help reduce 3 risk factors for stroke:  High cholesterol, hypertension, and excess weight.  RESOURCES Stroke/Support Group:  Call [phone]   STROKE EDUCATION PROVIDED/REVIEWED AND GIVEN TO PATIENT Stroke warning signs and symptoms How to activate emergency medical system (call 911). Medications prescribed at discharge. Need for follow-up after discharge. Personal risk factors for stroke. Pneumonia vaccine  given:  Flu vaccine given:  My questions have been answered, the writing is legible, and I understand these instructions.  I will adhere to these goals & educational materials that have been provided to me after my discharge from the hospital.     My questions have been answered and I understand these instructions. I will adhere to these goals and the provided educational materials after my discharge from the hospital.  Patient/Caregiver Signature _______________________________ Date __________  Clinician Signature _______________________________________ Date __________  Please bring this form and your medication list with you to all your follow-up doctor's appointments.

## 2017-02-06 NOTE — Progress Notes (Signed)
Occupational Therapy Session Note  Patient Details  Name: Brandon Howard MRN: [id number] Date of Birth: Feb 07, 1941  Today's Date: 02/06/2017 OT Individual Time: [id number] and 1430-1500 OT Individual Time Calculation (min): 60 min and 30 min   Short Term Goals: Week 1:  OT Short Term Goal 1 (Week 1): Pt will complete stand pivot transfer w/c <> toilet with mod A OT Short Term Goal 2 (Week 1): Pt will recall and utilize hemi dressing techniques with min VCs  OT Short Term Goal 3 (Week 1): Pt will maintain static standing balance with min A and UE support in order to decrease caregiver burden during ADL tasks OT Short Term Goal 4 (Week 1): Pt will use R UE at gross assist stabilizer level during functional task with max A  Skilled Therapeutic Interventions/Progress Updates:    Session one: PT seen for OT ADL bathing/dressing session. Pt sitting up in w/c upon arrival, agreeable to tx session. Due to pt's severe dysarthria, was unable to understand pt's verbalization. Attmpted to have pt write out desires, however, was not legible.  Pt nodded in agreement to showering task. Completed stand pivot transfer to drop arm BSC in shower. Required multimodal cuing for sequencing, assist for advancement of R LE. He bathed seated on 3-1, able to cross LE into figure four position to wash feet. Required VCs for proper sequencing and set-up of task.  He returned to w/c to dress, educated regarding hemi dressing technique though required max A to initiate technique. Pt stood at sink with min A to have pants pulled up, much improved standing balance compared to previous session.  Grooming tasks completed from w/c level at sink utilizing suction toothbrush for oral care. In [employer] gym, utilized [employer] to assess R side awareness. Pt able to attend to all 4 quadrants of board in equal time. Pt returned to room at end of session, left seated in w/c with QRB donned and chair alarm activated.  Pt required VCs  throughout session for safety awareness due to impulsivity.    Session Two: Pt seen for OT session focusing on neuro re-ed with R UE. Pt sitting up in w/c upon arrival, agreeable to tx session.  In therapy gym, pt completed arm skate activity with multimodal cuing for technique and to complete full range. Pt with active motion to complete full range without assist. Completed x2 sets of 10.  Pt returned to room and agreeable to attempt toilteing task. Completed max A transfer to toilet with use of grab, requiring total A for advancement of R LE and placement.  Pt left seated on toilet at end of session with hand off to NT for pt supervision  Therapy Documentation Precautions:  Precautions Precautions: Fall Restrictions Weight Bearing Restrictions: No Other Position/Activity Restrictions: Dysarthria from prior stroke with prior R sided weakness Pain:   No/ denies pain  See Function Navigator for Current Functional Status.   Therapy/Group: Individual Therapy  [name], Raigen Jagielski C 02/06/2017, 7:07 AM

## 2017-02-06 NOTE — IPOC Note (Signed)
Overall Plan of Care [facility]) Patient Details Name: Brandon Howard MRN: [id number] DOB: 1940/12/29  Admitting Diagnosis: Bilat CVAs  Hospital Problems: Principal Problem:   Gait disturbance, post-stroke Active Problems:   Dysphagia, post-stroke   History of CVA with residual deficit   Arterial ischemic stroke, vertebrobasilar, brainstem, acute, unspecified laterality (HCC)   Hyperlipidemia   Polysubstance abuse   Coronary artery disease involving coronary bypass graft of native heart without angina pectoris   Right hemiparesis (HCC)     Functional Problem List: Nursing Motor, Skin Integrity, Pain, Safety, Medication Management, Sensory  PT Balance, Endurance, Motor, Perception, Safety  OT Balance, Cognition, Endurance, Safety, Motor, Sensory  SLP Cognition, Linguistic, Motor, Perception, Safety, Nutrition  TR         Basic ADL's: OT Eating, Grooming, Bathing, Dressing, Toileting     Advanced  ADL's: OT       Transfers: PT Bed Mobility, Bed to Chair, Car, Occupational psychologist, Research scientist (life sciences): PT Ambulation, Psychologist, prison and probation services, Stairs     Additional Impairments: OT Fuctional Use of Upper Extremity  SLP Swallowing, Communication, Social Cognition comprehension, expression Problem Solving, Memory, Attention, Social Interaction, Awareness  TR      Anticipated Outcomes Item Anticipated Outcome  Self Feeding Supervision/ set-up  Swallowing  Supervision with least restrictive diet   Basic self-care  Min A  Toileting  Min A   Bathroom Transfers Min A  Bowel/Bladder  Patient will remain continent during hospital stay.  Transfers  Supervision to min assist  Locomotion  mod assist ambulation  Communication  Min A for basic wants and needs  Cognition  Min A to supervision  Pain  Pain levels will be less than 5 during hospital stay.  Safety/Judgment  Patient will have no falls/injuries during hospital stay.   Therapy Plan: PT Intensity:  Minimum of 1-2 x/day ,45 to 90 minutes PT Frequency: 5 out of 7 days PT Duration Estimated Length of Stay: 18 to 21 days OT Intensity: Minimum of 1-2 x/day, 45 to 90 minutes OT Frequency: 5 out of 7 days OT Duration/Estimated Length of Stay: 18-21 days SLP Intensity: Minumum of 1-2 x/day, 30 to 90 minutes SLP Frequency: 3 to 5 out of 7 days SLP Duration/Estimated Length of Stay: 3 to 3.5 weeks    Team Interventions: Nursing Interventions Patient/Family Education, Disease Management/Prevention, Medication Management, Dysphagia/Aspiration Precaution Training, Discharge Planning, Psychosocial Support  PT interventions Ambulation/gait training, Warden/ranger, Cognitive remediation/compensation, Community reintegration, Discharge planning, Disease management/prevention, DME/adaptive equipment instruction, Neuromuscular re-education, Functional mobility training, Psychosocial support, Splinting/orthotics, Skin care/wound management, Therapeutic Activities, Therapeutic Exercise, UE/LE Strength taining/ROM, Stair training, Patient/family education, UE/LE Coordination activities, Visual/perceptual remediation/compensation, Wheelchair propulsion/positioning  OT Interventions Warden/ranger, Cognitive remediation/compensation, Discharge planning, Disease mangement/prevention, DME/adaptive equipment instruction, Functional electrical stimulation, Functional mobility training, Neuromuscular re-education, Pain management, Patient/family education, Psychosocial support, Self Care/advanced ADL retraining, Skin care/wound managment, Splinting/orthotics, Therapeutic Activities, Therapeutic Exercise, UE/LE Strength taining/ROM, UE/LE Coordination activities, Wheelchair propulsion/positioning  SLP Interventions Cognitive remediation/compensation, Cueing hierarchy, Dysphagia/aspiration precaution training, Functional tasks, Internal/external aids, Multimodal communication approach, Oral motor  exercises, Speech/Language facilitation, Therapeutic Activities, Patient/family education  TR Interventions    SW/CM Interventions Discharge Planning, Psychosocial Support, Patient/Family Education   Barriers to Discharge MD  Medical stability  Nursing      PT      OT      SLP Lack of/limited family support, Decreased caregiver support Significant history of drug use, multiple strokes  SW Decreased caregiver support  Team Discharge Planning: Destination: PT-Home (Son plans to provide full time care) ,OT- Home , SLP-Home (Possibly SNF if family can't provide assistance) Projected Follow-up: PT-Home health PT, 24 hour supervision/assistance, OT-  Home health OT, 24 hour supervision/assistance, SLP-Home Health SLP, 24 hour supervision/assistance, Skilled Nursing facility Projected Equipment Needs: PT-Wheelchair (measurements) (To be determined), OT- To be determined, SLP-To be determined Equipment Details: PT- , OT-  Patient/family involved in discharge planning: PT- Patient,  OT-Patient, SLP-Patient unable/family or caregive not available  MD ELOS: 18-22 days. Medical Rehab Prognosis:  Good Assessment: 76 y.o.malewith history of HTN, CVA with residual RUE weakness and moderate dysarthria (CIR stay 10/2008) who was admitted on 01/31/17 with reports of increased weakness LUE and speech difficulty. CT head with concerns of left hemipons acute/subacute infarct and encephalomalacia right cerebellar hemisphere. 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 MRI showed acute infarcts in left > right pons, bilateral cerebellum and right thalamus, critical proximal BS stenosis with question thrombus or irregular plaque. 2D echo with EF 55- 60% and no wall abnormality. 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 Moderate to severe dysarthria noted with dysphagia and patient placed on dysphagia 3, nectar liquids. He was started on IV heparin for treatment of vasospasms and  transitioned to oral ASA+ Plavix on 8/6  As follow up CT head showed no significant changes. Dr. Pearlean BrownieSethi recommended monitoring for ETOH withdrawal as well as counseling to stop frequent cocaine use.   Therapy ongoing and patient with significant dysarthria and dysphagia as well as decline in mobility and ability to carry out ADL tasks. Will set goals for Min A with most tasks with PT/OT/SLP.  See Team Conference Notes for weekly updates to the plan of care

## 2017-02-06 NOTE — Progress Notes (Signed)
Physical Therapy Session Note  Patient Details  Name: Brandon Howard MRN: [id number] Date of Birth: April 01, 1941  Today's Date: 02/06/2017 PT Individual Time: 0800-0900 PT Individual Time Calculation (min): 60 min   Short Term Goals: Week 1:  PT Short Term Goal 1 (Week 1): Pt will perform supine to sit and return with min assist. PT Short Term Goal 2 (Week 1):  (Pt will perform sit to stand with min assist) PT Short Term Goal 3 (Week 1):  (Pt will perform bed to chair transfer with min assist.)  Skilled Therapeutic Interventions/Progress Updates:    Pt supine in bed upon PT arrival, agreeable to therapy tx and denies pain this session. Pt performed rolling x 2 to each side with supervision in order to don briefs. Pt donned pants in supine with min assist to loop pants around LEs, pt able to bridge x 2 with min assist to pull pants over hips. Pt transferred from supine to sitting EOB with moderate assist to move LEs and bring trunk forwards. Pt transferred from bed to w/c squat pivot with mod assist, verbal cues for sequencing and manual facilitation for weightshift. Pt propelled w/c x 40 ft to the gym with min assist for steering. Pt performed sit to stands x 3 using parallel bars for L UE support, facilitation for lateral weightshift over R LE, mod assist to stand. Pt worked on static standing balance x 3 trials without UE support, mod assist to maintain standing balance, using mirror for visual feedback to shift weight over R LE. Standing in parallel bars worked on pre gait with L UE support, mod assist for standing balance, facilitation for hip extension and blocking R knee during stance to step forward and backwards with L LE. Pt able to advance R LE using hip flexors, decreased hamstring and DF activation. Seated in w/c pt performed 1 x 10 isometric quad sets, holding for 3 sec. Worked on transfers, pt performed squat pivot transfer with mod assist, increased difficulty transferring to R  compared to L. Pt left seated in w/c with needs in reach and RN present to supervise for breakfast. QRB in place and call bell in reach.   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   Therapy/Group: Individual Therapy  Cresenciano GenreEmily van Schagen, PT, DPT 02/06/2017, 8:57 AM

## 2017-02-06 NOTE — Progress Notes (Signed)
Subjective/Complaints: Severe dysarthria, patient does follow commands. Able to do some yes no's  Review of systems cannot obtain due to dysarthria.  Objective: Vital Signs: Blood pressure 134/78, pulse 84, temperature 98.4 F (36.9 C), temperature source Oral, resp. rate 18, height '5\' 7"'$  (1.702 m), weight 69.5 kg (153 lb 3.5 oz), SpO2 95 %. No results found. Results for orders placed or performed during the hospital encounter of 02/04/17 (from the past 72 hour(s))  Comprehensive metabolic panel     Status: Abnormal   Collection Time: 02/05/17  4:40 AM  Result Value Ref Range   Sodium 145 135 - 145 mmol/L   Potassium 4.1 3.5 - 5.1 mmol/L   Chloride 105 101 - 111 mmol/L   CO2 29 22 - 32 mmol/L   Glucose, Bld 89 65 - 99 mg/dL   BUN 16 6 - 20 mg/dL   Creatinine, Ser 0.98 0.61 - 1.24 mg/dL   Calcium 9.1 8.9 - 10.3 mg/dL   Total Protein 6.4 (L) 6.5 - 8.1 g/dL   Albumin 3.5 3.5 - 5.0 g/dL   AST 22 15 - 41 U/L   ALT 17 17 - 63 U/L   Alkaline Phosphatase 52 38 - 126 U/L   Total Bilirubin 0.7 0.3 - 1.2 mg/dL   GFR calc non Af Amer >60 >60 mL/min   GFR calc Af Amer >60 >60 mL/min    Comment: (NOTE) The eGFR has been calculated using the CKD EPI equation. This calculation has not been validated in all clinical situations. eGFR's persistently <60 mL/min signify possible Chronic Kidney Disease.    Anion gap 11 5 - 15  CBC WITH DIFFERENTIAL     Status: None   Collection Time: 02/05/17  4:40 AM  Result Value Ref Range   WBC 6.0 4.0 - 10.5 K/uL   RBC 5.30 4.22 - 5.81 MIL/uL   Hemoglobin 16.2 13.0 - 17.0 g/dL   HCT 49.7 39.0 - 52.0 %   MCV 93.8 78.0 - 100.0 fL   MCH 30.6 26.0 - 34.0 pg   MCHC 32.6 30.0 - 36.0 g/dL   RDW 14.1 11.5 - 15.5 %   Platelets 277 150 - 400 K/uL   Neutrophils Relative % 54 %   Neutro Abs 3.3 1.7 - 7.7 K/uL   Lymphocytes Relative 33 %   Lymphs Abs 2.0 0.7 - 4.0 K/uL   Monocytes Relative 11 %   Monocytes Absolute 0.7 0.1 - 1.0 K/uL   Eosinophils Relative  2 %   Eosinophils Absolute 0.1 0.0 - 0.7 K/uL   Basophils Relative 0 %   Basophils Absolute 0.0 0.0 - 0.1 K/uL     HEENT: Right facial droop , dysarthria Cardio: RRR and no murmur Resp: CTA B/L and Unlabored GI: BS positive and NT, ND Extremity:  Pulses positive and No Edema Skin:   Intact Neuro: Cranial Nerve Abnormalities Right central 7, Abnormal Sensory Cannot assess secondary to severe dysarthria, Abnormal Motor 3 minus, right deltoid, biceps, triceps, grip, hip flexor, knee extensor, ankle dorsiflexor and Abnormal FMC Ataxic/ dec FMC Musc/Skel:  Other No pain with upper extremity or lower extremity range of motion Gen. no acute distress   Assessment/Plan: 1. 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 Physiatrist is providing close team supervision and 24 hour management of active medical problems listed below. Physiatrist and rehab team continue to assess barriers to discharge/monitor patient progress toward functional  and medical goals. FIM:       Function - Toileting Assist level: More than reasonable time     Function - Chair/bed transfer Chair/bed transfer method: Squat pivot Chair/bed transfer assist level: Moderate assist (Pt 50 - 74%/lift or lower) Chair/bed transfer assistive device: Bedrails, Armrests Chair/bed transfer details: Manual facilitation for weight shifting, Manual facilitation for placement, Verbal cues for precautions/safety, Verbal cues for technique  Function - Locomotion: Wheelchair Will patient use wheelchair at discharge?: Yes Type: Manual Max wheelchair distance:  (50 ft) Assist Level: Moderate assistance (Pt 50 - 74%) Assist Level: Moderate assistance (Pt 50 - 74%) Wheel 150 feet activity did not occur: Safety/medical concerns (Poor planning limits efficiency) Turns around,maneuvers to table,bed, and  toilet,negotiates 3% grade,maneuvers on rugs and over doorsills: No Function - Locomotion: Ambulation Assistive device: No device (hand held) Max distance:  (5 ft) Assist level: 2 helpers Walk 10 feet activity did not occur: Safety/medical concerns (Dependence) Walk 50 feet with 2 turns activity did not occur: Safety/medical concerns Walk 150 feet activity did not occur: Safety/medical concerns Walk 10 feet on uneven surfaces activity did not occur: Safety/medical concerns  Function - Comprehension Comprehension: Auditory Comprehension assist level: Understands basic 50 - 74% of the time/ requires cueing 25 - 49% of the time, Understands basic 25 - 49% of the time/ requires cueing 50 - 75% of the time  Function - Expression Expression: Verbal Expression assist level: Expresses basis less than 25% of the time/requires cueing >75% of the time.  Function - Social Interaction Social Interaction assist level: Interacts appropriately 50 - 74% of the time - May be physically or verbally inappropriate.  Function - Problem Solving Problem solving assist level: Solves basic less than 25% of the time - needs direction nearly all the time or does not effectively solve problems and may need a restraint for safety  Function - Memory Memory assist level: Recognizes or recalls less than 25% of the time/requires cueing greater than 75% of the time Patient normally able to recall (first 3 days only): None of the above  Medical Problem List and Plan: 1.  Dysarthria and dysphagia as well as decline in mobility and ability to carry out ADL tasks. secondary to bilateral CVA. Continue PT, OT, speech, CIR level 2.  DVT Prophylaxis/Anticoagulation: Pharmaceutical: Lovenox, monitor platelets, platelets were normal on 02/05/2017 3. Pain Management: N/A 4. Mood: LCSW to follow for evaluation and support.  5. Neuropsych: This patient is not fully capable of making decisions on his own behalf. 6. Skin/Wound  Care: routine pressure relief measures.  7. Fluids/Electrolytes/Nutrition: Monitor I/O. Check lytes in am as on nectar liquids. Monitor for signs of dehydration. Bmet normal on 02/05/2017 8. HTN: Permissive HTN for 5-7 days--Meds resumed today . Monitor for hypotension  in setting of proximal BA stenosis. Controlled 8/10 Vitals:   02/05/17 1439 02/06/17 0502  BP: 103/62 134/78  Pulse: 81 84  Resp: 18 18  Temp: 98.3 F (36.8 C) 98.4 F (36.9 C)  SpO2: 95% 95%   9. Hyperlipidemia: On Zocor.  10. Polysubstance abuse: Counsel on importance of cessation. Ask neuropsychology to evaluate 11. CAD s/p CABG: Asymptomatic. On ASA, Plavix and Zocor.   LOS (Days) 2 A FACE TO FACE EVALUATION WAS PERFORMED  KIRSTEINS,ANDREW E 02/06/2017, 8:53 AM

## 2017-02-07 ENCOUNTER — Inpatient Hospital Stay (HOSPITAL_COMMUNITY): Payer: Medicare HMO | Admitting: Physical Therapy

## 2017-02-07 ENCOUNTER — Inpatient Hospital Stay (HOSPITAL_COMMUNITY): Payer: Medicare HMO

## 2017-02-07 ENCOUNTER — Inpatient Hospital Stay (HOSPITAL_COMMUNITY): Payer: Medicare HMO | Admitting: Speech Pathology

## 2017-02-07 DIAGNOSIS — I69398 Other sequelae of cerebral infarction: Secondary | ICD-10-CM

## 2017-02-07 DIAGNOSIS — R269 Unspecified abnormalities of gait and mobility: Secondary | ICD-10-CM

## 2017-02-07 DIAGNOSIS — I6930 Unspecified sequelae of cerebral infarction: Secondary | ICD-10-CM

## 2017-02-07 DIAGNOSIS — I69391 Dysphagia following cerebral infarction: Secondary | ICD-10-CM

## 2017-02-07 NOTE — Progress Notes (Signed)
Physical Therapy Session Note  Patient Details  Name: Brandon Howard MRN: [id number] Date of Birth: [birth year]  Today's Date: 02/07/2017 PT Individual Time: 1105-1200 AND 1445-1528 PT Individual Time Calculation (min): 55 min AND 43 min  Short Term Goals: Week 1:  PT Short Term Goal 1 (Week 1): Pt will perform supine to sit and return with min assist. PT Short Term Goal 2 (Week 1):  (Pt will perform sit to stand with min assist) PT Short Term Goal 3 (Week 1):  (Pt will perform bed to chair transfer with min assist.)  Skilled Therapeutic Interventions/Progress Updates:   Session 1:  -Pt in w/c upon arrival and agreeable to therapy, no c/o pain. Worked on endurance and NMR this session.  -NMR in stance w/ Max A while performing LUE tasks and w/o UE support. Manual cues to engage R quad and facilitate RLE weight-bearing in stance. NMR in seated w/ supervision and emphasis on R weight shift and weight bearing while reaching outside of BOS w/ LUE. Verbal cues for weight-shifting and engaging core for support.  -NuStep to increase functional endurance and strength @ L1, 15 mins, verbal cues to attend to task  -Ended session in w/c w/ pink safety belt on, call bell within reach and all needs met.  Session 2:  -Pt in w/c upon arrival and agreeable to therapy, no c/o pain. Worked on [employer] and pregait activities this session in parallel bars.  -NMR in stance, performing static standing balance w/ Min A and bilateral UE support, manual and tactile cues at R knee to increase quad activation, manual facilitation of R weightshift to promote RLE weight-bearing -Pregait activities in stance w/ L forward/backward stepping w/ Max A to facilitate R weight shift for L step, Max A for dynamic standing balance while performing tasks  -Verbal and visual cues throughout NMR and pregait to decrease trunk flexion and increase thoracic extension -Ended session supine in bed w/ bed alarm on, call bell within reach  and all needs met.   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   Therapy/Group: Individual Therapy  Brandon Howard 02/07/2017, 11:16 AM

## 2017-02-07 NOTE — Progress Notes (Signed)
Subjective/Complaints: Lying in bed this morning. Per night.  Review of systems: Appears to deny P, SOB, nausea, vomiting, diarrhea.  Objective: Vital Signs: Blood pressure 133/76, pulse 83, temperature 99 F (37.2 C), temperature source Oral, resp. rate 16, height 5' 7" (1.702 m), weight 69.5 kg (153 lb 3.5 oz), SpO2 94 %. No results found. Results for orders placed or performed during the hospital encounter of 02/04/17 (from the past 72 hour(s))  Comprehensive metabolic panel     Status: Abnormal   Collection Time: 02/05/17  4:40 AM  Result Value Ref Range   Sodium 145 135 - 145 mmol/L   Potassium 4.1 3.5 - 5.1 mmol/L   Chloride 105 101 - 111 mmol/L   CO2 29 22 - 32 mmol/L   Glucose, Bld 89 65 - 99 mg/dL   BUN 16 6 - 20 mg/dL   Creatinine, Ser 0.98 0.61 - 1.24 mg/dL   Calcium 9.1 8.9 - 10.3 mg/dL   Total Protein 6.4 (L) 6.5 - 8.1 g/dL   Albumin 3.5 3.5 - 5.0 g/dL   AST 22 15 - 41 U/L   ALT 17 17 - 63 U/L   Alkaline Phosphatase 52 38 - 126 U/L   Total Bilirubin 0.7 0.3 - 1.2 mg/dL   GFR calc non Af Amer >60 >60 mL/min   GFR calc Af Amer >60 >60 mL/min    Comment: (NOTE) The eGFR has been calculated using the CKD EPI equation. This calculation has not been validated in all clinical situations. eGFR's persistently <60 mL/min signify possible Chronic Kidney Disease.    Anion gap 11 5 - 15  CBC WITH DIFFERENTIAL     Status: None   Collection Time: 02/05/17  4:40 AM  Result Value Ref Range   WBC 6.0 4.0 - 10.5 K/uL   RBC 5.30 4.22 - 5.81 MIL/uL   Hemoglobin 16.2 13.0 - 17.0 g/dL   HCT 49.7 39.0 - 52.0 %   MCV 93.8 78.0 - 100.0 fL   MCH 30.6 26.0 - 34.0 pg   MCHC 32.6 30.0 - 36.0 g/dL   RDW 14.1 11.5 - 15.5 %   Platelets 277 150 - 400 K/uL   Neutrophils Relative % 54 %   Neutro Abs 3.3 1.7 - 7.7 K/uL   Lymphocytes Relative 33 %   Lymphs Abs 2.0 0.7 - 4.0 K/uL   Monocytes Relative 11 %   Monocytes Absolute 0.7 0.1 - 1.0 K/uL   Eosinophils Relative 2 %   Eosinophils  Absolute 0.1 0.0 - 0.7 K/uL   Basophils Relative 0 %   Basophils Absolute 0.0 0.0 - 0.1 K/uL     HEENT: Normocephalic, atraumatic Cardio: RRR and no JVD Resp: CTA B/L and Unlabored GI: BS positive and ND Skin:   Intact. Warm and dry. Neuro:  Right facial droop , dysarthria Cranial Nerve Abnormalities Right central 7 Severe dysarthria Motor 3-/5, right deltoid, biceps, triceps, grip with spastic tone  4+/5 right hip flexor, knee extensor, ankle dorsiflexor and LUE/LLE: 5/5 proximal to distal Musc/Skel:  No edema, no tenderness. Gen. no acute distress. Vital signs reviewed.   Assessment/Plan: 1. 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 Physiatrist is providing close team supervision and 24 hour management of active medical problems listed below. Physiatrist and rehab team continue to assess barriers to discharge/monitor patient progress toward functional and medical goals. FIM: Function - Bathing Position: Shower Body parts   bathed by patient: Chest, Abdomen, Front perineal area, Right upper leg, Left upper leg Body parts bathed by helper: Right arm, Left arm, Buttocks, Right lower leg, Left lower leg, Back  Function- Upper Body Dressing/Undressing What is the patient wearing?: Pull over shirt/dress Pull over shirt/dress - Perfomed by patient: Thread/unthread right sleeve, Pull shirt over trunk Pull over shirt/dress - Perfomed by helper: Thread/unthread left sleeve, Put head through opening Function - Lower Body Dressing/Undressing What is the patient wearing?: Pants, Non-skid slipper socks Position: Wheelchair/chair at sink Pants- Performed by helper: Thread/unthread right pants leg, Thread/unthread left pants leg, Pull pants up/down Non-skid slipper socks- Performed by patient: Don/doff right sock, Don/doff left sock Assist for footwear:  Partial/moderate assist Assist for lower body dressing:  (Required+2 on day of eval; +1 today)  Function - Toileting Toileting steps completed by helper: Adjust clothing prior to toileting, Performs perineal hygiene, Adjust clothing after toileting Toileting Assistive Devices: Grab bar or rail Assist level: More than reasonable time  Function Midwife transfer assistive device: Grab bar Assist level to toilet: Moderate assist (Pt 50 - 74%/lift or lower) Assist level from toilet: Maximal assist (Pt 25 - 49%/lift and lower)  Function - Chair/bed transfer Chair/bed transfer method: Squat pivot Chair/bed transfer assist level: Moderate assist (Pt 50 - 74%/lift or lower) Chair/bed transfer assistive device: Bedrails, Armrests Chair/bed transfer details: Manual facilitation for weight shifting, Manual facilitation for placement, Verbal cues for precautions/safety, Verbal cues for technique  Function - Locomotion: Wheelchair Will patient use wheelchair at discharge?: Yes Type: Manual Max wheelchair distance: 40 ft Assist Level: Touching or steadying assistance (Pt > 75%) Assist Level: Moderate assistance (Pt 50 - 74%) Wheel 150 feet activity did not occur: Safety/medical concerns (Poor planning limits efficiency) Turns around,maneuvers to table,bed, and toilet,negotiates 3% grade,maneuvers on rugs and over doorsills: No Function - Locomotion: Ambulation Assistive device: No device (hand held) Max distance:  (5 ft) Assist level: 2 helpers Walk 10 feet activity did not occur: Safety/medical concerns (Dependence) Walk 50 feet with 2 turns activity did not occur: Safety/medical concerns Walk 150 feet activity did not occur: Safety/medical concerns Walk 10 feet on uneven surfaces activity did not occur: Safety/medical concerns  Function - Comprehension Comprehension: Auditory Comprehension assist level: Understands basic 50 - 74% of the time/ requires cueing 25 - 49% of the  time, Understands basic 25 - 49% of the time/ requires cueing 50 - 75% of the time  Function - Expression Expression: Verbal Expression assist level: Expresses basis less than 25% of the time/requires cueing >75% of the time.  Function - Social Interaction Social Interaction assist level: Interacts appropriately 50 - 74% of the time - May be physically or verbally inappropriate.  Function - Problem Solving Problem solving assist level: Solves basic less than 25% of the time - needs direction nearly all the time or does not effectively solve problems and may need a restraint for safety  Function - Memory Memory assist level: Recognizes or recalls less than 25% of the time/requires cueing greater than 75% of the time Patient normally able to recall (first 3 days only): None of the above  Medical Problem List and Plan: 1.  Dysarthria and dysphagia as well as decline in mobility and ability to carry out ADL tasks. secondary to bilateral CVA.  Continue CIR 2.  DVT Prophylaxis/Anticoagulation: Pharmaceutical: Lovenox, monitor platelets, platelets were normal on 02/05/2017 3. Pain Management: N/A 4. Mood: LCSW to follow for evaluation and support.  5. Neuropsych: This  patient is not fully capable of making decisions on his own behalf. 6. Skin/Wound Care: routine pressure relief measures.  7. Fluids/Electrolytes/Nutrition: Monitor I/Os. Monitor for signs of dehydration.    D3 Nectar, advance as tolerated  Bmet normal on 02/05/2017  Labs ordered for Monday 8. HTN: Meds resumed. Monitor for hypotension  in setting of proximal BA stenosis.   Controlled 8/11 Vitals:   02/06/17 1346 02/07/17 0438  BP: 114/61 133/76  Pulse: 88 83  Resp: 18 16  Temp: 98.7 F (37.1 C) 99 F (37.2 C)  SpO2: 97% 94%   9. Hyperlipidemia: On Zocor.  10. Polysubstance abuse: Counsel on importance of cessation. 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 On ASA, Plavix and Zocor.   LOS (Days)  3 A FACE TO FACE EVALUATION WAS PERFORMED  Ankit Anil Patel 02/07/2017, 10:11 AM   

## 2017-02-07 NOTE — Progress Notes (Signed)
Occupational Therapy Session Note  Patient Details  Name: Brandon Howard MRN: [id number] Date of Birth: 05-Dec-1940  Today's Date: 02/07/2017 OT Individual Time: 4098-11910800-0855 OT Individual Time Calculation (min): 55 min    Short Term Goals: Week 1:  OT Short Term Goal 1 (Week 1): Pt will complete stand pivot transfer w/c <> toilet with mod A OT Short Term Goal 2 (Week 1): Pt will recall and utilize hemi dressing techniques with min VCs  OT Short Term Goal 3 (Week 1): Pt will maintain static standing balance with min A and UE support in order to decrease caregiver burden during ADL tasks OT Short Term Goal 4 (Week 1): Pt will use R UE at gross assist stabilizer level during functional task with max A  Skilled Therapeutic Interventions/Progress Updates:    1:1. Pt supine>EOB with supervision with MIN A for scooting. Pt squat pivot transfer with MOD A for lifting EOB>w/c<>BSC with VC for sequencing. Pt has successful BM with A for clothing mannagement. Pt bathes at sink with MOD A for sit to stand and balance while washing buttocks and HOH A for RUE to wash LUE. Pt able to assume seated figure 4 for bathing feet, thread B pant legs and doff/don socks. Pt able to recall hemi dressing techniques with min question cues and dons shirt with supervision. Pt requires MOD A for sit to stand at sink to advance pants past hips. Exited session with pt seated in w/c with call light in reach, QRB donned and half lap tray on chair.  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   Therapy/Group: Individual Therapy  Shon HaleStephanie M Marleen Moret 02/07/2017, 8:56 AM

## 2017-02-07 NOTE — Progress Notes (Signed)
Speech Language Pathology Daily Session Note  Patient Details  Name: Brandon Howard MRN: [id number] Date of Birth: 10/15/1940  Today's Date: 02/07/2017 SLP Individual Time: 0730-0800 SLP Individual Time Calculation (min): 30 min  Short Term Goals: Week 1: SLP Short Term Goal 1 (Week 1): Pt will communicate basic wants and needs via multimodal communication with Max A cues.  SLP Short Term Goal 2 (Week 1): Pt will utilize speech intelligibility strategies to achieve ~ 50% intelligibility at the word level with Max A cues.  SLP Short Term Goal 3 (Week 1): Pt will answer yes/no questions using multimodalities with ~ 50% accuracy and Mod A cues.  SLP Short Term Goal 4 (Week 1): Pt will consume dysphagia 3 and nectar thick liquids with minimal overt s/s of aspiration and Max A cues for use of compensatory swallow strategies.  SLP Short Term Goal 5 (Week 1): Pt will demonstrate overt s/s of aspiration with trials of ice chips to demonstrate readiness for repeat instrumental swallow study.   Skilled Therapeutic Interventions: Skilled treatment session focused on dysphagia and speech communication goals. SLP facilitated session by providing skilled observation of pt consuming dysphagia 3 breakfast tray with nectar thick liquids. Pt with cough x 2 during consumption however he demonstrates strong cough with no respiratory distress. Pt required Max A multimodal cues to increase intelligibility when labeling items on his tray. Pt achieved ~ <25% intelligibility. Pt left upright in bed, bed alarm on and all needs within reach. Continue per current plan of care.      Function:  Eating Eating   Modified Consistency Diet: Yes Eating Assist Level: More than reasonable amount of time;Set up assist for;Supervision or verbal cues   Eating Set Up Assist For: Opening containers       Cognition Comprehension Comprehension assist level: Understands basic 50 - 74% of the time/ requires cueing 25 - 49% of  the time;Understands basic 25 - 49% of the time/ requires cueing 50 - 75% of the time  Expression   Expression assist level: Expresses basis less than 25% of the time/requires cueing >75% of the time.  Social Interaction Social Interaction assist level: Interacts appropriately 50 - 74% of the time - May be physically or verbally inappropriate.  Problem Solving Problem solving assist level: Solves basic less than 25% of the time - needs direction nearly all the time or does not effectively solve problems and may need a restraint for safety  Memory Memory assist level: Recognizes or recalls less than 25% of the time/requires cueing greater than 75% of the time    Pain    Therapy/Group: Individual Therapy  Nehan Flaum 02/07/2017, 7:50 AM

## 2017-02-08 ENCOUNTER — Inpatient Hospital Stay (HOSPITAL_COMMUNITY): Payer: Medicare HMO

## 2017-02-08 DIAGNOSIS — I10 Essential (primary) hypertension: Secondary | ICD-10-CM

## 2017-02-08 NOTE — Progress Notes (Signed)
Occupational Therapy Session Note  Patient Details  Name: Brandon Howard MRN: [id number] Date of Birth: 04/03/1941  Today's Date: 02/08/2017 OT Individual Time: 6720-9470 OT Individual Time Calculation (min): 59 min    Short Term Goals: Week 1:  OT Short Term Goal 1 (Week 1): Pt will complete stand pivot transfer w/c <> toilet with mod A OT Short Term Goal 2 (Week 1): Pt will recall and utilize hemi dressing techniques with min VCs  OT Short Term Goal 3 (Week 1): Pt will maintain static standing balance with min A and UE support in order to decrease caregiver burden during ADL tasks OT Short Term Goal 4 (Week 1): Pt will use R UE at gross assist stabilizer level during functional task with max A  Skilled Therapeutic Interventions/Progress Updates:    1;1. Pain in R hip reported but indicates it is small with gestures. Pt agreeable to bathe and dress session at shower level. Pt Supine>EOB<>w/c<>TTB with min A using bed rail and grab bar for leverage with VC for safety awareness as pt does not lock breaks prior to beginning transfers. Pt bathes at sit to stand level with A to wash back and MOD A for balance while washing buttocks with RUE weight bearing on grab bar. Pt dresses with MAX VC for hemi techniques for UB and LB clothing. To don shirt pt requires A to pull shirt down back. Pt requires A to advance pants past hips and fasten pants along with MOD A for sit to stand at sink with L knee blocking. Pt dons socks in figure 4. Exited session iwht pt seated in bed with call ight in reach and all needs met.   Therapy Documentation Precautions:  Precautions Precautions: Fall Restrictions Weight Bearing Restrictions: No Other Position/Activity Restrictions: Dysarthria from prior stroke with prior R sided weakness See Function Navigator for Current Functional Status.   Therapy/Group: Individual Therapy  Tonny Branch 02/08/2017, 10:44 AM

## 2017-02-08 NOTE — Progress Notes (Signed)
Subjective/Complaints: Patient seen lying in bed this morning. He appears to indicate that he slept well overnight.  Review of systems: Appears to deny P, SOB, nausea, vomiting, diarrhea, however limited due to dysarthria and cognition.  Objective: Vital Signs: Blood pressure 118/71, pulse 77, temperature 98.1 F (36.7 C), temperature source Oral, resp. rate 18, height 5\' 7"  (1.702 m), weight 69.5 kg (153 lb 3.5 oz), SpO2 93 %. No results found. No results found for this or any previous visit (from the past 72 hour(s)).   HEENT: Normocephalic, atraumatic Cardio: RRR and no JVD Resp: CTA B/L and Unlabored GI: BS positive and ND Skin:   Intact. Warm and dry. Neuro:  Right facial droop , dysarthria Cranial Nerve Abnormalities Right central 7 Severe dysarthria Motor 3-/5, right deltoid, biceps, triceps, grip with spastic tone (stable) 4+/5 right hip flexor, knee extensor, ankle dorsiflexor and LUE/LLE: 5/5 proximal to distal Musc/Skel:  No edema, no tenderness. Gen. no acute distress. Vital signs reviewed.   Assessment/Plan: 1. 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 Physiatrist is providing close team supervision and 24 hour management of active medical problems listed below. Physiatrist and rehab team continue to assess barriers to discharge/monitor patient progress toward functional and medical goals. FIM: Function - Bathing Position: Shower Body parts bathed by patient: Chest, Abdomen, Front perineal area, Right upper leg, Left upper leg Body parts bathed by helper: Right arm, Left arm, Buttocks, Right lower leg, Left lower leg, Back  Function- Upper Body Dressing/Undressing What is the patient wearing?: Pull over shirt/dress Pull over shirt/dress - Perfomed by patient: Thread/unthread right sleeve, Pull shirt over trunk Pull over shirt/dress -  Perfomed by helper: Thread/unthread left sleeve, Put head through opening Function - Lower Body Dressing/Undressing What is the patient wearing?: Pants, Non-skid slipper socks Position: Wheelchair/chair at sink Pants- Performed by helper: Thread/unthread right pants leg, Thread/unthread left pants leg, Pull pants up/down Non-skid slipper socks- Performed by patient: Don/doff right sock, Don/doff left sock Assist for footwear: Partial/moderate assist Assist for lower body dressing:  (Required+2 on day of eval; +1 today)  Function - Toileting Toileting steps completed by helper: Adjust clothing after toileting, Performs perineal hygiene, Adjust clothing prior to toileting Toileting Assistive Devices: Grab bar or rail Assist level: More than reasonable time  Function Programmer, multimedia- Toilet Transfers Toilet transfer assistive device: Grab bar Assist level to toilet: Moderate assist (Pt 50 - 74%/lift or lower) Assist level from toilet: Maximal assist (Pt 25 - 49%/lift and lower)  Function - Chair/bed transfer Chair/bed transfer method: Squat pivot Chair/bed transfer assist level: Touching or steadying assistance (Pt > 75%) Chair/bed transfer assistive device: Bedrails, Armrests Chair/bed transfer details: Manual facilitation for weight shifting, Manual facilitation for placement, Verbal cues for precautions/safety, Verbal cues for technique  Function - Locomotion: Wheelchair Will patient use wheelchair at discharge?: Yes Type: Manual Max wheelchair distance: 40 ft Assist Level: Touching or steadying assistance (Pt > 75%) Assist Level: Moderate assistance (Pt 50 - 74%) Wheel 150 feet activity did not occur: Safety/medical concerns (Poor planning limits efficiency) Turns around,maneuvers to table,bed, and toilet,negotiates 3% grade,maneuvers on rugs and over doorsills: No Function - Locomotion: Ambulation Assistive device: No device (hand held) Max distance:  (5 ft) Assist level: 2 helpers Walk 10  feet activity did not occur: Safety/medical concerns (Dependence) Walk 50 feet with 2 turns activity did not occur: Safety/medical concerns Walk 150 feet activity did not occur:  Safety/medical concerns Walk 10 feet on uneven surfaces activity did not occur: Safety/medical concerns  Function - Comprehension Comprehension: Auditory Comprehension assist level: Understands basic 90% of the time/cues < 10% of the time  Function - Expression Expression: Verbal Expression assist level: Expresses basis less than 25% of the time/requires cueing >75% of the time.  Function - Social Interaction Social Interaction assist level: Interacts appropriately 90% of the time - Needs monitoring or encouragement for participation or interaction.  Function - Problem Solving Problem solving assist level: Solves basic less than 25% of the time - needs direction nearly all the time or does not effectively solve problems and may need a restraint for safety  Function - Memory Memory assist level: Recognizes or recalls less than 25% of the time/requires cueing greater than 75% of the time Patient normally able to recall (first 3 days only): None of the above  Medical Problem List and Plan: 1.  Dysarthria and dysphagia as well as decline in mobility and ability to carry out ADL tasks. secondary to bilateral CVA.  Continue CIR 2.  DVT Prophylaxis/Anticoagulation: Pharmaceutical: Lovenox, monitor platelets, platelets were normal on 02/05/2017 3. Pain Management: N/A 4. Mood: LCSW to follow for evaluation and support.  5. Neuropsych: This patient is not fully capable of making decisions on his own behalf. 6. Skin/Wound Care: routine pressure relief measures.  7. Fluids/Electrolytes/Nutrition: Monitor I/Os. Monitor for signs of dehydration.    D3 Nectar, advance as tolerated  Bmet normal on 02/05/2017  Labs ordered for tomorrow 8. HTN: Meds resumed. Monitor for hypotension  in setting of proximal BA stenosis.    Controlled 8/12 Vitals:   02/07/17 1450 02/08/17 0404  BP: 104/63 118/71  Pulse: 73 77  Resp: 18 18  Temp: 98.2 F (36.8 C) 98.1 F (36.7 C)  SpO2: 96% 93%   9. Hyperlipidemia: On Zocor.  10. Polysubstance abuse: Counsel on importance of cessation. 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 On ASA, Plavix and Zocor.   LOS (Days) 4 A FACE TO FACE EVALUATION WAS PERFORMED  Rudine Rieger Karis Juba 02/08/2017, 8:32 AM

## 2017-02-09 ENCOUNTER — Inpatient Hospital Stay (HOSPITAL_COMMUNITY): Payer: Medicare HMO | Admitting: Speech Pathology

## 2017-02-09 ENCOUNTER — Inpatient Hospital Stay (HOSPITAL_COMMUNITY): Payer: Medicare HMO | Admitting: Physical Therapy

## 2017-02-09 ENCOUNTER — Inpatient Hospital Stay (HOSPITAL_COMMUNITY): Payer: Medicare HMO | Admitting: Occupational Therapy

## 2017-02-09 LAB — BASIC METABOLIC PANEL
ANION GAP: 8 (ref 5–15)
BUN: 18 mg/dL (ref 6–20)
CHLORIDE: 108 mmol/L (ref 101–111)
CO2: 24 mmol/L (ref 22–32)
Calcium: 8.9 mg/dL (ref 8.9–10.3)
Creatinine, Ser: 0.83 mg/dL (ref 0.61–1.24)
GFR calc non Af Amer: >60 mL/min (ref >60)
GLUCOSE: 139 mg/dL — AB (ref 65–99)
Potassium: 4.1 mmol/L (ref 3.5–5.1)
Sodium: 140 mmol/L (ref 135–145)

## 2017-02-09 NOTE — Progress Notes (Signed)
Speech Language Pathology Daily Session Note  Patient Details  Name: Brandon Howard MRN: [id number] Date of Birth: 15-Aug-1940  Today's Date: 02/09/2017 SLP Individual Time: 0830-0900 SLP Individual Time Calculation (min): 30 min  Short Term Goals: Week 1: SLP Short Term Goal 1 (Week 1): Pt will communicate basic wants and needs via multimodal communication with Max A cues.  SLP Short Term Goal 2 (Week 1): Pt will utilize speech intelligibility strategies to achieve ~ 50% intelligibility at the word level with Max A cues.  SLP Short Term Goal 3 (Week 1): Pt will answer yes/no questions using multimodalities with ~ 50% accuracy and Mod A cues.  SLP Short Term Goal 4 (Week 1): Pt will consume dysphagia 3 and nectar thick liquids with minimal overt s/s of aspiration and Max A cues for use of compensatory swallow strategies.  SLP Short Term Goal 5 (Week 1): Pt will demonstrate overt s/s of aspiration with trials of ice chips to demonstrate readiness for repeat instrumental swallow study.   Skilled Therapeutic Interventions: Skilled treatment session focused on dysphagia and speech communication goals. SLP facilitated session by providing skilled observation of pt consuming ice chips. Pt with cough during ~ 75% of trials. Pt required Max A multimodal cues to express basic phrase level wants (such as needing to "pee"). Pt achieved intelligibility of ~ 25% at very simple phrase level. Pt was left upright in bed, bed alarm on and all needs within reach. Continue per current plan of care.      Function:  Eating Eating   Modified Consistency Diet: No (Trials with SLP only)             Cognition Comprehension Comprehension assist level: Understands basic 75 - 89% of the time/ requires cueing 10 - 24% of the time  Expression   Expression assist level: Expresses basis less than 25% of the time/requires cueing >75% of the time.  Social Interaction Social Interaction assist level: Interacts  appropriately 75 - 89% of the time - Needs redirection for appropriate language or to initiate interaction.  Problem Solving Problem solving assist level: Solves basic less than 25% of the time - needs direction nearly all the time or does not effectively solve problems and may need a restraint for safety  Memory Memory assist level: Recognizes or recalls less than 25% of the time/requires cueing greater than 75% of the time    Pain Pain Assessment Pain Score: 0-No pain Faces Pain Scale: No hurt  Therapy/Group: Individual Therapy  Guila Owensby 02/09/2017, 12:48 PM

## 2017-02-09 NOTE — Progress Notes (Signed)
Physical Therapy Session Note  Patient Details  Name: Brandon Howard Alles MRN: [id number] Date of Birth: 1941/01/30  Today's Date: 02/09/2017 PT Individual Time: 0900-1000 PT Individual Time Calculation (min): 60 min   Short Term Goals: Week 1:  PT Short Term Goal 1 (Week 1): Pt will perform supine to sit and return with min assist. PT Short Term Goal 2 (Week 1):  (Pt will perform sit to stand with min assist) PT Short Term Goal 3 (Week 1):  (Pt will perform bed to chair transfer with min assist.)   Skilled Therapeutic Interventions/Progress Updates: Pt presented in bed agreeable to therapy. Pt noted to have soiled brief, performed rolling L/R on flat bed with use of bed rails supervision. PTA donned clean brief and pt requiring modA for threading pants and performing small bridge to clear buttock to complete LB dressing. Performed supine to to modA with use of features. Performed squat pivot to R modA requiring mod cues for sequencing and safety. Pt transported to rehab gym total assist for time management. Performed sit to stand from w/Howard and mat for NMR, standing tolerance, and wt shifting with use of HW. Pt requiring max cues for increasing R wt shift and PTA blocking R knee. Pt improving wt shift with mirror feedback and able to maintain neutral with minA. Pt would intermittently at time demonstrate impulsive actions and attempt to initiate step with RLE. Pt returned to room and remained in w/Howard with QRB and chair alarm on, pt refusing half lap tray, with call bell within reach.      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   Therapy/Group: Individual Therapy  Limmie Schoenberg  Jalynne Persico, PTA  02/09/2017, 3:57 PM

## 2017-02-09 NOTE — Progress Notes (Signed)
Subjective/Complaints: Up in bed eating breakfast. Has good appetite. Denies pain   ROS: pt denies nausea, vomiting, diarrhea, cough, shortness of breath or chest pain   Objective: Vital Signs: Blood pressure 134/73, pulse 78, temperature 98.2 F (36.8 C), temperature source Oral, resp. rate 17, height 5\' 7"  (1.702 m), weight 69.5 kg (153 lb 3.5 oz), SpO2 94 %. No results found. No results found for this or any previous visit (from the past 72 hour(s)).   HEENT: Normocephalic, atraumatic Cardio: RRR without murmur. No JVD  Resp: CTA Bilaterally without wheezes or rales. Normal effort  GI: BS positive and ND Skin:   Intact. Warm and dry. Neuro:  Right facial droop , dysarthria Cranial Nerve Abnormalities Right central 7 Severe dysarthria Motor 3- to 3/5, right deltoid, biceps, triceps, grip with spastic tone. Does not initiate with right side. 4+/5 right hip flexor, knee extensor, ankle dorsiflexor and LUE/LLE: 5/5 proximal to distal Musc/Skel:  No edema, no tenderness. Gen. no acute distress.  .   Assessment/Plan: 1. 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 Physiatrist is providing close team supervision and 24 hour management of active medical problems listed below. Physiatrist and rehab team continue to assess barriers to discharge/monitor patient progress toward functional and medical goals. FIM: Function - Bathing Position: Shower Body parts bathed by patient: Chest, Abdomen, Front perineal area, Right upper leg, Left upper leg, Left lower leg, Right lower leg, Buttocks, Left arm Body parts bathed by helper: Back, Left arm Assist Level: Touching or steadying assistance(Pt > 75%)  Function- Upper Body Dressing/Undressing What is the patient wearing?: Pull over shirt/dress Pull over shirt/dress - Perfomed by patient: Thread/unthread right sleeve,  Thread/unthread left sleeve, Put head through opening, Pull shirt over trunk Pull over shirt/dress - Perfomed by helper: Thread/unthread left sleeve, Put head through opening Assist Level: Supervision or verbal cues Function - Lower Body Dressing/Undressing What is the patient wearing?: Pants, Non-skid slipper socks Position: Wheelchair/chair at sink Pants- Performed by patient: Thread/unthread right pants leg, Thread/unthread left pants leg Pants- Performed by helper: Pull pants up/down, Fasten/unfasten pants Non-skid slipper socks- Performed by patient: Don/doff right sock, Don/doff left sock Assist for footwear: Supervision/touching assist Assist for lower body dressing: Touching or steadying assistance (Pt > 75%)  Function - Toileting Toileting steps completed by helper: Adjust clothing prior to toileting, Performs perineal hygiene, Adjust clothing after toileting Toileting Assistive Devices: Grab bar or rail Assist level: More than reasonable time  Function Programmer, multimedia- Toilet Transfers Toilet transfer assistive device: Grab bar Assist level to toilet: Moderate assist (Pt 50 - 74%/lift or lower) Assist level from toilet: Maximal assist (Pt 25 - 49%/lift and lower)  Function - Chair/bed transfer Chair/bed transfer method: Squat pivot Chair/bed transfer assist level: Touching or steadying assistance (Pt > 75%) Chair/bed transfer assistive device: Bedrails, Armrests Chair/bed transfer details: Manual facilitation for weight shifting, Manual facilitation for placement, Verbal cues for precautions/safety, Verbal cues for technique  Function - Locomotion: Wheelchair Will patient use wheelchair at discharge?: Yes Type: Manual Max wheelchair distance: 40 ft Assist Level: Touching or steadying assistance (Pt > 75%) Assist Level: Moderate assistance (Pt 50 - 74%) Wheel 150 feet activity did not occur: Safety/medical concerns (Poor planning limits efficiency) Turns around,maneuvers to table,bed,  and toilet,negotiates 3% grade,maneuvers on rugs and over doorsills: No Function - Locomotion: Ambulation Assistive device: No device (hand held) Max distance:  (5 ft) Assist level: 2  helpers Walk 10 feet activity did not occur: Safety/medical concerns (Dependence) Walk 50 feet with 2 turns activity did not occur: Safety/medical concerns Walk 150 feet activity did not occur: Safety/medical concerns Walk 10 feet on uneven surfaces activity did not occur: Safety/medical concerns  Function - Comprehension Comprehension: Auditory Comprehension assist level: Understands complex 90% of the time/cues 10% of the time  Function - Expression Expression: Verbal Expression assist level: Expresses basis less than 25% of the time/requires cueing >75% of the time.  Function - Social Interaction Social Interaction assist level: Interacts appropriately 90% of the time - Needs monitoring or encouragement for participation or interaction.  Function - Problem Solving Problem solving assist level: Solves basic less than 25% of the time - needs direction nearly all the time or does not effectively solve problems and may need a restraint for safety  Function - Memory Memory assist level: Recognizes or recalls less than 25% of the time/requires cueing greater than 75% of the time Patient normally able to recall (first 3 days only): None of the above  Medical Problem List and Plan: 1.  Dysarthria and dysphagia as well as decline in mobility and ability to carry out ADL tasks. secondary to bilateral CVA.  Continue CIR therapies 2.  DVT Prophylaxis/Anticoagulation: Pharmaceutical: Lovenox, monitor platelets, platelets were normal on 02/05/2017 3. Pain Management: N/A 4. Mood: LCSW to follow for evaluation and support.  5. Neuropsych: This patient is not fully capable of making decisions on his own behalf. 6. Skin/Wound Care: routine pressure relief measures.  7. Fluids/Electrolytes/Nutrition: Monitor I/Os.  Monitor for signs of dehydration.    D3 Nectar, advance as tolerated  Bmet normal on 02/05/2017  Labs pending for today 8. HTN: Meds resumed. Monitor for hypotension  in setting of proximal BA stenosis.   Controlled 8/12 Vitals:   02/08/17 1500 02/09/17 0445  BP: 122/68 134/73  Pulse: 85 78  Resp: 16 17  Temp: 98.8 F (37.1 C) 98.2 F (36.8 C)  SpO2: 96% 94%   9. Hyperlipidemia: On Zocor.  10. Polysubstance abuse: Counsel on importance of cessation. 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 On ASA, Plavix and Zocor.   LOS (Days) 5 A FACE TO FACE EVALUATION WAS PERFORMED  SWARTZ,ZACHARY T 02/09/2017, 9:14 AM

## 2017-02-09 NOTE — Progress Notes (Signed)
Occupational Therapy Session Note  Patient Details  Name: Brandon Howard MRN: [id number] Date of Birth: 05/15/1941  Today's Date: 02/09/2017 OT Individual Time: 1100-1200 and 1330-1415 OT Individual Time Calculation (min): 60 min and 45 min   Short Term Goals: Week 1:  OT Short Term Goal 1 (Week 1): Pt will complete stand pivot transfer w/c <> toilet with mod A OT Short Term Goal 2 (Week 1): Pt will recall and utilize hemi dressing techniques with min VCs  OT Short Term Goal 3 (Week 1): Pt will maintain static standing balance with min A and UE support in order to decrease caregiver burden during ADL tasks OT Short Term Goal 4 (Week 1): Pt will use R UE at gross assist stabilizer level during functional task with max A  Skilled Therapeutic Interventions/Progress Updates:    Session One: Pt seen for OT session focusing on neuro re-ed in prep for functional tasks. Pt sitting up in w/c upon arrival. Able to make known that he was agreeable to session, denying bathing/dressing this morning. In therapy gym, pt completed arm skate with R UE, pt able to fully complete internal and external rotation with visual cuing for full movement to R. While pt completed this, therapist retrieved hemi height w/c. Pt transferred into hemiheight chair and practiced propulsion with w/c using hemi technique for increased independence with mobility. Pt predominantly using L LE to power w/c despite VCs for incorporation of R UE. Completed obstacle course weaving through cones in simulation of home environment, completed with supervision and pt demosntrating good awareness to R side during task.  He then completed standing trials using hallway railing. Pt stood with min A to power up using rail and stood with CGA. Practiced letting go over L UE support in prep for functional standing task without UE support. Pt observed to have limited weightbearing through R LE, mirror placed for visual feedback and pt practiced weight  shifting onto R. He denied pain in R LE with weight bearing. Pt propelled w/c back to room in same manner as described above. Pt left sitting up in w/c, all needs in reach, QRB and chair alarm on.   Session Two: Pt seen for OT session focusing on neuro re-ed with weightbearing and functional balance. Pt sitting up in w/c upon arrival, agreeable to tx session. He self propelled w/c to gym with min A and VCs for technique.  Completed stand pivot transfer to therapy mat with mod A. Completed functional reaching task with R UE placed in weightbearing position with assist from therapist for stabilization. Then completed reaching down to floor on R to retrieve item with L hand for WBing through feet as well, required mod A for dynamic sitting balance during this task.  Pt voiced increased pain in R shoulder. Noted one finger subluxation, unknown if this is new as pt with hx of CVA. Applied kinesiotape for support and education provided regarding purpose and use of kinesiotape.  Pt returned to room at end of session, requesting return to bed. Mod A stand pivot to bed with mod A to return to supine. Pt left in supine with all needs in reach, positioned with pillows for comfort and support, bed alarm on.   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   Therapy/Group: Individual Therapy  [name], Dorthula Bier C 02/09/2017, 7:17 AM

## 2017-02-10 ENCOUNTER — Inpatient Hospital Stay (HOSPITAL_COMMUNITY): Payer: Medicare HMO | Admitting: Occupational Therapy

## 2017-02-10 ENCOUNTER — Inpatient Hospital Stay (HOSPITAL_COMMUNITY): Payer: Medicare HMO | Admitting: Speech Pathology

## 2017-02-10 ENCOUNTER — Inpatient Hospital Stay (HOSPITAL_COMMUNITY): Payer: Medicare HMO | Admitting: Physical Therapy

## 2017-02-10 NOTE — Progress Notes (Signed)
Speech Language Pathology Daily Session Note  Patient Details  Name: Brandon Howard MRN: [id number] Date of Birth: 01-Jan-1941  Today's Date: 02/10/2017 SLP Individual Time: 1115-1200 SLP Individual Time Calculation (min): 45 min  Short Term Goals: Week 1: SLP Short Term Goal 1 (Week 1): Pt will communicate basic wants and needs via multimodal communication with Max A cues.  SLP Short Term Goal 2 (Week 1): Pt will utilize speech intelligibility strategies to achieve ~ 50% intelligibility at the word level with Max A cues.  SLP Short Term Goal 3 (Week 1): Pt will answer yes/no questions using multimodalities with ~ 50% accuracy and Mod A cues.  SLP Short Term Goal 4 (Week 1): Pt will consume dysphagia 3 and nectar thick liquids with minimal overt s/s of aspiration and Max A cues for use of compensatory swallow strategies.  SLP Short Term Goal 5 (Week 1): Pt will demonstrate overt s/s of aspiration with trials of ice chips to demonstrate readiness for repeat instrumental swallow study.   Skilled Therapeutic Interventions: Skilled ST services focused on cognitive and dysphagia goals. SLP facilitated thin liquid and ice chip trials via teaspoon and cup sips. Pt demonstrated severe overt s/s aspiration given ice chip trials via teaspoon. Pt demonstrated Min s/s aspiration of thin liquids via teaspoon and given cup sips mod-severe overt s/s aspiration. SLP initiated and educated pt and nursing staff to begin water protocol given teaspoon only in order to monitor overt s/s aspiration and assess readiness for future instrumental study. SLP facilitated cognitive skills with response to basic yes/no question requiring min-supervision verbal and visual cues. SLP educated pt on methods to communicate including utilizing call bell function to alert staff to needs. Pt was left in chair with call bell within reach. Recommend to continue ST services and begin water protocol.     Function:  Eating Eating    Modified Consistency Diet: Yes Eating Assist Level: Set up assist for;Supervision or verbal cues   Eating Set Up Assist For: Opening containers Helper Scoops Food on Utensil: Occasionally Helper Brings Food to Mouth: Occasionally   Cognition Comprehension Comprehension assist level: Understands basic 75 - 89% of the time/ requires cueing 10 - 24% of the time  Expression   Expression assist level: Expresses basis less than 25% of the time/requires cueing >75% of the time.  Social Interaction Social Interaction assist level: Interacts appropriately 25 - 49% of time - Needs frequent redirection.  Problem Solving Problem solving assist level: Solves basic less than 25% of the time - needs direction nearly all the time or does not effectively solve problems and may need a restraint for safety  Memory Memory assist level: Recognizes or recalls less than 25% of the time/requires cueing greater than 75% of the time    Pain Pain Assessment Pain Assessment: No/denies pain  Therapy/Group: Individual Therapy  Rolla Kedzierski  [facility] IncCRATCH 02/10/2017, 12:14 PM

## 2017-02-10 NOTE — Progress Notes (Signed)
Occupational Therapy Session Note  Patient Details  Name: Brandon Howard MRN: [id number] Date of Birth: [birth year]  Today's Date: 02/10/2017 OT Individual Time: 1000-1100 and 1430-1500 OT Individual Time Calculation (min): 60 min and 30 min   Short Term Goals: Week 1:  OT Short Term Goal 1 (Week 1): Pt will complete stand pivot transfer w/c <> toilet with mod A OT Short Term Goal 2 (Week 1): Pt will recall and utilize hemi dressing techniques with min VCs  OT Short Term Goal 3 (Week 1): Pt will maintain static standing balance with min A and UE support in order to decrease caregiver burden during ADL tasks OT Short Term Goal 4 (Week 1): Pt will use R UE at gross assist stabilizer level during functional task with max A  Skilled Therapeutic Interventions/Progress Updates:    Session one:  Pt seen for OT ADL bathing/dressing session. Pt sitting up in w/c upon arrival, agreeable to tx session and bathing at shower level. First made need known for toileting task. Completed max A stand pivot transfer w/c <> toilet with use of grab bar, pt required assist for advanacement and positioning of R LE during transfer. Total A toileting task with heavy steadying assist as grab bar on pt's weaker R side. Completed squat pivot transfe rto drop arm BSC with mod A. He bathed seated on BSC, able to cross LEs into figure four position to wash feet and able to [facility] to complete buttock hygiene.  He returned to w/c to dress using hemi techniques with VCs and standing at sink with mod A for clothing management. Pt with R ankle internally rotating during standing with no awareness and assist provided for stabilziation and direction of R knee. Completed [facility] standing [facility] with mod A. R UE supported in hand splint and pt able to intermittently let go of RW with L hand in prep for functional standing tasks.  Pt left seated in w/c at end of session, QRB donned and all needs in reach.   Session Two:  Pt seen for OT session focusing on functional standing balance. Pt in w/c upon arrival, noted to hav blood seeping through R sock. Pt had scab bleeding though was unaware of it and reported not feeling it. RN made aware, alevian dressing donned, and pt donned new socks. In therapy gym, pt completed standing trials at side of parallel bars. Pt able to maintain grasp on bar with R UE while removing clothes pins placed along pants in simulation of LB dressing task. Completed with heavy steadying assist progressing to CGA with stabilization provided at R knee. Completed x4 trials.  Pt returned to room at end of session, left seated in w/c with all needs in reach, QRB donned.   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   Therapy/Group: Individual Therapy  [name], Dillon Mcreynolds C 02/10/2017, 7:04 AM

## 2017-02-10 NOTE — Progress Notes (Signed)
Subjective/Complaints: Eating breakfast again. No new complaints. Still won't engage much with right hand. Did attempt to bring hand up to tray  ROS: pt denies nausea, vomiting, diarrhea, cough, shortness of breath or chest pain   Objective: Vital Signs: Blood pressure 125/69, pulse 62, temperature 97.8 F (36.6 C), temperature source Oral, resp. rate 18, height 5\' 7"  (1.702 m), weight 69.5 kg (153 lb 3.5 oz), SpO2 99 %. No results found. Results for orders placed or performed during the hospital encounter of 02/04/17 (from the past 72 hour(s))  Basic metabolic panel     Status: Abnormal   Collection Time: 02/09/17  9:55 AM  Result Value Ref Range   Sodium 140 135 - 145 mmol/L   Potassium 4.1 3.5 - 5.1 mmol/L   Chloride 108 101 - 111 mmol/L   CO2 24 22 - 32 mmol/L   Glucose, Bld 139 (H) 65 - 99 mg/dL   BUN 18 6 - 20 mg/dL   Creatinine, Ser 02/11/17 0.61 - 1.24 mg/dL   Calcium 8.9 8.9 - 1.21 mg/dL   GFR calc non Af Amer >60 >60 mL/min   GFR calc Af Amer >60 >60 mL/min    Comment: (NOTE) The eGFR has been calculated using the CKD EPI equation. This calculation has not been validated in all clinical situations. eGFR's persistently <60 mL/min signify possible Chronic Kidney Disease.    Anion gap 8 5 - 15     HEENT: Normocephalic, atraumatic Cardio: RRR without murmur. No JVD   Resp: CTA Bilaterally without wheezes or rales. Normal effort   GI: BS positive and ND Skin:   Intact. Warm and dry. Neuro:  Right facial droop , dysarthria Cranial Nerve Abnormalities Right central 7 Severe dysarthria Motor 3- to 3/5, right deltoid, biceps, triceps, grip with spastic tone. Needs cueing to initiate with right side. 4+/5 right hip flexor, knee extensor, ankle dorsiflexor and LUE/LLE: 5/5 proximal to distal Musc/Skel:  No edema, no tenderness. Gen. no acute distress.  .   Assessment/Plan: 1. 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 Physiatrist is providing close team supervision and 24 hour management of active medical problems listed below. Physiatrist and rehab team continue to assess barriers to discharge/monitor patient progress toward functional and medical goals. FIM: Function - Bathing Position: Shower Body parts bathed by patient: Chest, Abdomen, Front perineal area, Right upper leg, Left upper leg, Left lower leg, Right lower leg, Buttocks, Left arm Body parts bathed by helper: Back, Left arm Assist Level: Touching or steadying assistance(Pt > 75%)  Function- Upper Body Dressing/Undressing What is the patient wearing?: Pull over shirt/dress Pull over shirt/dress - Perfomed by patient: Thread/unthread right sleeve, Thread/unthread left sleeve, Put head through opening, Pull shirt over trunk Pull over shirt/dress - Perfomed by helper: Thread/unthread left sleeve, Put head through opening Assist Level: Supervision or verbal cues Function - Lower Body Dressing/Undressing What is the patient wearing?: Pants, Non-skid slipper socks Position: Wheelchair/chair at sink Pants- Performed by patient: Thread/unthread right pants leg, Thread/unthread left pants leg Pants- Performed by helper: Pull pants up/down, Fasten/unfasten pants Non-skid slipper socks- Performed by patient: Don/doff right sock, Don/doff left sock Assist for footwear: Supervision/touching assist Assist for lower body dressing: Touching or steadying assistance (Pt > 75%)  Function - Toileting Toileting steps completed by helper: Adjust clothing prior to toileting, Performs perineal hygiene, Adjust clothing after toileting Toileting Assistive Devices: Grab bar or rail  Assist level: More than reasonable time  Function - Toilet Transfers Toilet transfer assistive device: Grab bar Assist level to toilet: Moderate assist (Pt 50 - 74%/lift or lower) Assist level from toilet:  Maximal assist (Pt 25 - 49%/lift and lower)  Function - Chair/bed transfer Chair/bed transfer method: Squat pivot Chair/bed transfer assist level: Touching or steadying assistance (Pt > 75%) Chair/bed transfer assistive device: Bedrails, Armrests Chair/bed transfer details: Manual facilitation for weight shifting, Manual facilitation for placement, Verbal cues for precautions/safety, Verbal cues for technique  Function - Locomotion: Wheelchair Will patient use wheelchair at discharge?: Yes Type: Manual Max wheelchair distance: 40 ft Assist Level: Touching or steadying assistance (Pt > 75%) Assist Level: Moderate assistance (Pt 50 - 74%) Wheel 150 feet activity did not occur: Safety/medical concerns (Poor planning limits efficiency) Turns around,maneuvers to table,bed, and toilet,negotiates 3% grade,maneuvers on rugs and over doorsills: No Function - Locomotion: Ambulation Assistive device: No device (hand held) Max distance:  (5 ft) Assist level: 2 helpers Walk 10 feet activity did not occur: Safety/medical concerns (Dependence) Walk 50 feet with 2 turns activity did not occur: Safety/medical concerns Walk 150 feet activity did not occur: Safety/medical concerns Walk 10 feet on uneven surfaces activity did not occur: Safety/medical concerns  Function - Comprehension Comprehension: Auditory Comprehension assist level: Understands basic 75 - 89% of the time/ requires cueing 10 - 24% of the time  Function - Expression Expression: Verbal Expression assist level: Expresses basis less than 25% of the time/requires cueing >75% of the time.  Function - Social Interaction Social Interaction assist level: Interacts appropriately 25 - 49% of time - Needs frequent redirection.  Function - Problem Solving Problem solving assist level: Solves basic less than 25% of the time - needs direction nearly all the time or does not effectively solve problems and may need a restraint for  safety  Function - Memory Memory assist level: Recognizes or recalls less than 25% of the time/requires cueing greater than 75% of the time Patient normally able to recall (first 3 days only): None of the above  Medical Problem List and Plan: 1.  Dysarthria and dysphagia as well as decline in mobility and ability to carry out ADL tasks. secondary to bilateral CVA.  Continue CIR therapies 2.  DVT Prophylaxis/Anticoagulation: Pharmaceutical: Lovenox, monitor platelets, platelets were normal on 02/05/2017 3. Pain Management: N/A 4. Mood: LCSW to follow for evaluation and support.  5. Neuropsych: This patient is not fully capable of making decisions on his own behalf. 6. Skin/Wound Care: routine pressure relief measures.  7. Fluids/Electrolytes/Nutrition: Monitor I/Os. Monitor for signs of dehydration.    D3 Nectar, advance as tolerated  Bmet normal on 08/13 8. HTN: Meds resumed. Monitor for hypotension  in setting of proximal BA stenosis.   Controlled 8/14 Vitals:   02/09/17 1345 02/10/17 0400  BP: 120/76 125/69  Pulse: 75 62  Resp: 18 18  Temp: 98.1 F (36.7 C) 97.8 F (36.6 C)  SpO2: 95% 99%   9. Hyperlipidemia: On Zocor.  10. Polysubstance abuse: Counsel on importance of cessation. 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 On ASA, Plavix and Zocor.   LOS (Days) 6 A FACE TO FACE EVALUATION WAS PERFORMED  Molley Houser T 02/10/2017, 9:32 AM

## 2017-02-10 NOTE — Progress Notes (Signed)
Physical Therapy Session Note  Patient Details  Name: Brandon Howard MRN: [id number] Date of Birth: Dec 14, 1940  Today's Date: 02/10/2017 PT Individual Time: 0830-0930 PT Individual Time Calculation (min): 60 min   Short Term Goals: Week 1:  PT Short Term Goal 1 (Week 1): Pt will perform supine to sit and return with min assist. PT Short Term Goal 2 (Week 1):  (Pt will perform sit to stand with min assist) PT Short Term Goal 3 (Week 1):  (Pt will perform bed to chair transfer with min assist.)  Skilled Therapeutic Interventions/Progress Updates: Pt presented in bed with family present grooming beard and leaving shortly thereafter. Pt agreeable to therapy denies pain. Performed rolling L/R with supervision due to soiled brief. Mod cues for use of RLE to position for bridge and pt able to clear buttock to facilitate donning pants. Performed supine to sit minA with use of features and modA to scoot to EOB. Performed squat pivot to w/c modA cues for anterior wt shift and to increase use of LE to facilitate transfer. Pt propelled w/c hemi-technique with primary use of LLE 15800ft with min guard and intermittent cues for obstacle negotiation. Performed sit to stand x 4 with RW and R hand splint with modA. Pt required assist for RLE placement and facilitation for R knee extension and proper R foot placement due to foot rolling out. Pt then returned to room and remained in w/c with chair alarm on and call bell within reach.       Therapy Documentation Precautions:  Precautions Precautions: Fall Restrictions Weight Bearing Restrictions: No Other Position/Activity Restrictions: Dysarthria from prior stroke with prior R sided weakness General:   Vital Signs:   Pain: Pain Assessment Pain Assessment: No/denies pain Faces Pain Scale: No hurt   See Function Navigator for Current Functional Status.   Therapy/Group: Individual Therapy  Ronniesha Seibold  Yenny Kosa, PTA  02/10/2017, 9:41  AM

## 2017-02-11 ENCOUNTER — Inpatient Hospital Stay (HOSPITAL_COMMUNITY): Payer: Medicare HMO | Admitting: Occupational Therapy

## 2017-02-11 ENCOUNTER — Inpatient Hospital Stay (HOSPITAL_COMMUNITY): Payer: Medicare HMO | Admitting: Speech Pathology

## 2017-02-11 ENCOUNTER — Inpatient Hospital Stay (HOSPITAL_COMMUNITY): Payer: Medicare HMO | Admitting: Physical Therapy

## 2017-02-11 LAB — BASIC METABOLIC PANEL
ANION GAP: 8 (ref 5–15)
BUN: 15 mg/dL (ref 6–20)
CALCIUM: 8.6 mg/dL — AB (ref 8.9–10.3)
CO2: 27 mmol/L (ref 22–32)
Chloride: 105 mmol/L (ref 101–111)
Creatinine, Ser: 0.73 mg/dL (ref 0.61–1.24)
GLUCOSE: 92 mg/dL (ref 65–99)
Potassium: 3.9 mmol/L (ref 3.5–5.1)
Sodium: 140 mmol/L (ref 135–145)

## 2017-02-11 LAB — CBC
HCT: 46.5 % (ref 39.0–52.0)
HEMOGLOBIN: 15.2 g/dL (ref 13.0–17.0)
MCH: 30.3 pg (ref 26.0–34.0)
MCHC: 32.7 g/dL (ref 30.0–36.0)
MCV: 92.8 fL (ref 78.0–100.0)
Platelets: 315 10*3/uL (ref 150–400)
RBC: 5.01 MIL/uL (ref 4.22–5.81)
RDW: 13.5 % (ref 11.5–15.5)
WBC: 6.8 10*3/uL (ref 4.0–10.5)

## 2017-02-11 NOTE — Patient Care Conference (Signed)
Inpatient RehabilitationTeam Conference and Plan of Care Update Date: 02/11/2017   Time: 10:55 AM    Patient Name: Brandon Howard      Medical Record Number: [id number]  Date of Birth: [birth year] Sex: Male         Room/Bed: 4W22C/4W22C-01 Payor Info: Payor: [employer] MEDICARE / Plan: [employer] MEDICARE HMO / Product Type: *No Product type* /    Admitting Diagnosis: Bilat CVAs  Admit Date/Time:  02/04/2017  5:16 PM Admission Comments: No comment available   Primary Diagnosis:  Gait disturbance, post-stroke Principal Problem: Gait disturbance, post-stroke  Patient Active Problem List   Diagnosis Date Noted  . Gait disturbance, post-stroke 02/06/2017  . Arterial ischemic stroke, vertebrobasilar, brainstem, acute, unspecified laterality (HCC) 02/04/2017  . Hyperlipidemia   . Polysubstance abuse   . 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 Right hemiparesis (HCC)   . Dysphagia, post-stroke   . History of CVA with residual deficit   . Hyperglycemia   . Benign essential HTN   . Dysarthria, post-stroke   . Cocaine abuse   . Stroke (cerebrum) (HCC) 01/31/2017    Expected Discharge Date: Expected Discharge Date: 02/27/17  Team Members Present: Physician leading conference: Dr. Claudette Laws Social Worker Present: Dossie Der, LCSW Nurse Present: Carlean Purl, RN PT Present: Harless Litten, PTA OT Present: Johnsie Cancel, OT SLP Present: Jackalyn Lombard, SLP PPS Coordinator present : Tora Duck, RN, CRRN     Current Status/Progress Goal Weekly Team Focus  Medical   Patient remained severely dysarthric speech intelligibility is very limited, right upper extremity function is poor  Increase expressive language ability, improve upper extremity functioning  Identify potential caregivers   Bowel/Bladder   continent of b/b; needs assistance with urinal positioning; lbm 8/14  maintain continence during rehab stay  monitor for  changes in b/b   Swallow/Nutrition/ Hydration   overt s/s of aspiration with thin liquids, initiate water protocol  Supervision  trials of thin liquids   ADL's   Total A toileting; mod A LB dressing; min A UB bathing/dressing; mod- max A functional transfers  min A overall  Functional standing balance, neuro re-ed, ADL re-training, functional transfers   Mobility   minA supine to sit, modA sit to stand, mod/maxA standing balance, supervision w/c mobilty  min assist transfers, gait (max), w/c (min)  transfers, R NMR, pre gait/gait   Communication   Max to Mod A to achieve ~ 50% intelligiblity at the word level  Min to Mod for sentence  introduction of compensatory speech strategies   Safety/Cognition/ Behavioral Observations  attention, basic problem solving, anticipatory awareness  Min to Mod cues  sustained attention, basic problem solving   Pain   denies pain  <2 out of 10 during rehab stay  assess pain q shift and prn   Skin   callus to L heel, small wound to R dorsalis pedis, abd bruising and scattered bruising  skin free of infection and breakdown during rehab stay  assess skin q shift and prn      *See Care Plan and progress notes for long and short-term goals.     Barriers to Discharge  Current Status/Progress Possible Resolutions Date Resolved   Physician    Medical stability;Inaccessible home environment;Lack of/limited family support  History of substance abuse  Slowly progressing towards goals, mobility is improving  Continue rehabilitation program, social work to help with identification of potential caregivers      Nursing  PT                    OT                  SLP                SW     Son works and can not provide 24 hr care          Discharge Planning/Teaching Needs:  Family deciding if can provide the care for him at home, son works. Aware of the options of NHP versus ALF. Family aware pt needs to be on board with the plan.      Team  Discussion:  Improving in his function- Goals are min assist level. Currently min-mod level of assist and leans to the right. Very dysarthic and difficult to understand. Has right residual deficits form last stroke in 2010. Dys 3 nectar thick liquid, also water protocol started 8/14. Speech to do trials of thin liquids. Will need to see if family can provide 24 hr care.   Revisions to Treatment Plan:  DC 8/31, may need NHP    Continued Need for Acute Rehabilitation Level of Care: The patient requires daily medical management by a physician with specialized training in physical medicine and rehabilitation for the following conditions: Daily direction of a multidisciplinary physical rehabilitation program to ensure safe treatment while eliciting the highest outcome that is of practical value to the patient.: Yes Daily medical management of patient stability for increased activity during participation in an intensive rehabilitation regime.: Yes Daily analysis of laboratory values and/or radiology reports with any subsequent need for medication adjustment of medical intervention for : Neurological problems;Other  Lucy ChrisDupree, Krystofer Hevener G 02/11/2017, 12:43 PM

## 2017-02-11 NOTE — Progress Notes (Signed)
Subjective/Complaints: Per PT, is making improvement with transfers, amb limited due to motor control issues, difficult to maintain foot flat  ROS: pt denies nausea, vomiting, diarrhea, cough, shortness of breath or chest pain   Objective: Vital Signs: Blood pressure 124/65, pulse 76, temperature 98 F (36.7 C), temperature source Oral, resp. rate 18, height '5\' 7"'$  (1.702 m), weight 69.9 kg (154 lb 1.4 oz), SpO2 98 %. No results found. Results for orders placed or performed during the hospital encounter of 02/04/17 (from the past 72 hour(s))  Basic metabolic panel     Status: Abnormal   Collection Time: 02/09/17  9:55 AM  Result Value Ref Range   Sodium 140 135 - 145 mmol/L   Potassium 4.1 3.5 - 5.1 mmol/L   Chloride 108 101 - 111 mmol/L   CO2 24 22 - 32 mmol/L   Glucose, Bld 139 (H) 65 - 99 mg/dL   BUN 18 6 - 20 mg/dL   Creatinine, Ser 0.83 0.61 - 1.24 mg/dL   Calcium 8.9 8.9 - 10.3 mg/dL   GFR calc non Af Amer >60 >60 mL/min   GFR calc Af Amer >60 >60 mL/min    Comment: (NOTE) The eGFR has been calculated using the CKD EPI equation. This calculation has not been validated in all clinical situations. eGFR's persistently <60 mL/min signify possible Chronic Kidney Disease.    Anion gap 8 5 - 15  Basic metabolic panel     Status: Abnormal   Collection Time: 02/11/17  3:59 AM  Result Value Ref Range   Sodium 140 135 - 145 mmol/L   Potassium 3.9 3.5 - 5.1 mmol/L   Chloride 105 101 - 111 mmol/L   CO2 27 22 - 32 mmol/L   Glucose, Bld 92 65 - 99 mg/dL   BUN 15 6 - 20 mg/dL   Creatinine, Ser 0.73 0.61 - 1.24 mg/dL   Calcium 8.6 (L) 8.9 - 10.3 mg/dL   GFR calc non Af Amer >60 >60 mL/min   GFR calc Af Amer >60 >60 mL/min    Comment: (NOTE) The eGFR has been calculated using the CKD EPI equation. This calculation has not been validated in all clinical situations. eGFR's persistently <60 mL/min signify possible Chronic Kidney Disease.    Anion gap 8 5 - 15  CBC     Status:  None   Collection Time: 02/11/17  3:59 AM  Result Value Ref Range   WBC 6.8 4.0 - 10.5 K/uL   RBC 5.01 4.22 - 5.81 MIL/uL   Hemoglobin 15.2 13.0 - 17.0 g/dL   HCT 46.5 39.0 - 52.0 %   MCV 92.8 78.0 - 100.0 fL   MCH 30.3 26.0 - 34.0 pg   MCHC 32.7 30.0 - 36.0 g/dL   RDW 13.5 11.5 - 15.5 %   Platelets 315 150 - 400 K/uL     HEENT: Normocephalic, atraumatic Cardio: RRR without murmur. No JVD   Resp: CTA Bilaterally without wheezes or rales. Normal effort   GI: BS positive and ND Skin:   Intact. Warm and dry. Neuro:  Right facial droop , dysarthria Cranial Nerve Abnormalities Right central 7 Severe dysarthria Motor 3- to 3/5, right deltoid, biceps, triceps, grip with spastic tone. Needs cueing to initiate with right side. 4+/5 right hip flexor, knee extensor, ankle dorsiflexor and LUE/LLE: 5/5 proximal to distal Musc/Skel:  No edema, no tenderness. Gen. no acute distress.  .   Assessment/Plan: 1. 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 Physiatrist is providing close team supervision and 24 hour management of active medical problems listed below. Physiatrist and rehab team continue to assess barriers to discharge/monitor patient progress toward functional and medical goals. FIM: Function - Bathing Position: Shower Body parts bathed by patient: Chest, Abdomen, Front perineal area, Right upper leg, Left upper leg, Left lower leg, Right lower leg, Buttocks, Right arm Body parts bathed by helper: Right arm, Left arm Assist Level: Touching or steadying assistance(Pt > 75%)  Function- Upper Body Dressing/Undressing What is the patient wearing?: Pull over shirt/dress Pull over shirt/dress - Perfomed by patient: Thread/unthread right sleeve, Thread/unthread left sleeve, Put head through opening, Pull shirt over trunk Pull over shirt/dress - Perfomed by helper:  Thread/unthread left sleeve, Put head through opening Assist Level: Touching or steadying assistance(Pt > 75%) Function - Lower Body Dressing/Undressing What is the patient wearing?: Pants, Non-skid slipper socks Position: Wheelchair/chair at sink Pants- Performed by patient: Thread/unthread right pants leg, Thread/unthread left pants leg, Pull pants up/down Pants- Performed by helper: Pull pants up/down, Fasten/unfasten pants Non-skid slipper socks- Performed by patient: Don/doff right sock, Don/doff left sock Assist for footwear: Supervision/touching assist Assist for lower body dressing: Touching or steadying assistance (Pt > 75%)  Function - Toileting Toileting steps completed by helper: Adjust clothing prior to toileting, Performs perineal hygiene, Adjust clothing after toileting Toileting Assistive Devices: Grab bar or rail Assist level: Touching or steadying assistance (Pt.75%)  Function - Air cabin crew transfer assistive device: Grab bar Assist level to toilet: Maximal assist (Pt 25 - 49%/lift and lower) Assist level from toilet: Moderate assist (Pt 50 - 74%/lift or lower)  Function - Chair/bed transfer Chair/bed transfer method: Squat pivot Chair/bed transfer assist level: Moderate assist (Pt 50 - 74%/lift or lower) Chair/bed transfer assistive device: Bedrails, Armrests Chair/bed transfer details: Manual facilitation for weight shifting, Manual facilitation for placement, Verbal cues for precautions/safety, Verbal cues for technique  Function - Locomotion: Wheelchair Will patient use wheelchair at discharge?: Yes Type: Manual Max wheelchair distance: 132f Assist Level: Touching or steadying assistance (Pt > 75%) Assist Level: Touching or steadying assistance (Pt > 75%) Wheel 150 feet activity did not occur: Safety/medical concerns (Poor planning limits efficiency) Assist Level: Touching or steadying assistance (Pt > 75%) Turns around,maneuvers to table,bed, and  toilet,negotiates 3% grade,maneuvers on rugs and over doorsills: No Function - Locomotion: Ambulation Assistive device: No device (hand held) Max distance:  (5 ft) Assist level: 2 helpers Walk 10 feet activity did not occur: Safety/medical concerns (Dependence) Walk 50 feet with 2 turns activity did not occur: Safety/medical concerns Walk 150 feet activity did not occur: Safety/medical concerns Walk 10 feet on uneven surfaces activity did not occur: Safety/medical concerns  Function - Comprehension Comprehension: Auditory Comprehension assist level: Understands basic 75 - 89% of the time/ requires cueing 10 - 24% of the time  Function - Expression Expression: Verbal Expression assist level: Expresses basis less than 25% of the time/requires cueing >75% of the time.  Function - Social Interaction Social Interaction assist level: Interacts appropriately 25 - 49% of time - Needs frequent redirection.  Function - Problem Solving Problem solving assist level: Solves basic less than 25% of the time - needs direction nearly all the time or does not effectively solve problems and may need a restraint for safety  Function - Memory Memory assist level: Recognizes or recalls less than 25% of the time/requires cueing greater than 75% of  the time Patient normally able to recall (first 3 days only): None of the above  Medical Problem List and Plan: 1.  Dysarthria and dysphagia as well as decline in mobility and ability to carry out ADL tasks. secondary to bilateral CVA.  Continue CIR therapies, Team conference today please see physician documentation under team conference tab, met with team face-to-face to discuss problems,progress, and goals. Formulized individual treatment plan based on medical history, underlying problem and comorbidities. 2.  DVT Prophylaxis/Anticoagulation: Pharmaceutical: Lovenox, monitor platelets, platelets were normal on 02/11/2017 3. Pain Management: N/A 4. Mood: LCSW to  follow for evaluation and support.  5. Neuropsych: This patient is not fully capable of making decisions on his own behalf. 6. Skin/Wound Care: routine pressure relief measures.  7. Fluids/Electrolytes/Nutrition: Monitor I/Os. Monitor for signs of dehydration.    D3 Nectar, advance as tolerated  Bmet normal on 08/15 8. HTN: Meds resumed. Monitor for hypotension  in setting of proximal BA stenosis.   Controlled 8/15 Vitals:   02/10/17 1508 02/11/17 0605  BP: (!) 121/55 124/65  Pulse: 74 76  Resp: 18 18  Temp: 98.7 F (37.1 C) 98 F (36.7 C)  SpO2: 98% 98%   9. Hyperlipidemia: On Zocor.  10. Polysubstance abuse: Counsel on importance of cessation. 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 On ASA, Plavix and Zocor.   LOS (Days) 7 A FACE TO FACE EVALUATION WAS PERFORMED  KIRSTEINS,ANDREW E 02/11/2017, 8:43 AM

## 2017-02-11 NOTE — Progress Notes (Signed)
Social Work Patient ID: Brandon Howard, male   DOB: [birth year], 76 y.o.   MRN: [id number]   Met with pt to discuss team conference goals of min assist level and target discharge 8/31. Pt is aware He will need 24 hr care and concern his son works and who would be able to provide this. Son and sister to come in tomorrow, pt is aware of this and will discuss options and plans. Pt likes the place He lives and doesn't want to give this up, it was difficult to get into due to waiting list for the apartments. Will work on a safe discharge plan for pt.

## 2017-02-11 NOTE — Progress Notes (Signed)
Occupational Therapy Session Note  Patient Details  Name: Brandon Howard MRN: [id number] Date of Birth: Nov 14, 1940  Today's Date: 02/11/2017 OT Individual Time: 1100-1200 and 1341-1430  OT Individual Time Calculation (min): 60 min and 39 min  Short Term Goals: Week 1:  OT Short Term Goal 1 (Week 1): Pt will complete stand pivot transfer w/Howard <> toilet with mod A OT Short Term Goal 2 (Week 1): Pt will recall and utilize hemi dressing techniques with min VCs  OT Short Term Goal 3 (Week 1): Pt will maintain static standing balance with min A and UE support in order to decrease caregiver burden during ADL tasks OT Short Term Goal 4 (Week 1): Pt will use R UE at gross assist stabilizer level during functional task with max A  Skilled Therapeutic Interventions/Progress Updates:    Session One: Pt seen for OT session focusing on functional standing balance and endurance. Pt sitting up in w/Howard upon arrival, agreeable to tx session. He declined bathing/dressing this morning, agreeable to go to gym. He self propelled w/Howard to therapy gym using hemi technique and general strengthening and activity tolerance.  Completed stand pivot transfer to therapy mat with mod-max A. From EOM, worked on sit <> stand at [employer]. Pt able to stand from elevated mat with min A. He voiced increased pain in R shoulder when hand passively placed onto RW hand splint, denied pain once UE in position.  From standing position, pt practiced placing and removing clothes pins from clothes pins tree with L UE, initially requiring mod A for standing balance progressing to CGA with assist for stabilization and positioning of R knee due to severe bowing out of knee resulting in foot inverting and pt standing on side of foot, unable to self correct. Encouraged pt to have family bring in shoes in order to assist with stabilization and possible need for orthotic consult. Completed standing activity x3 trials with seated rest break btwn trials.  Assist required for controlled descent. Completed AAROM for R UE. Pt with full gravity eliminated elbow flexion and able to complete ~80% extension from supported position.  Completed stand pivot transfer with use of RW back to w/Howard at end of session, max A required for safely getting hips lined up into chair. He returned to room at end of session, left with QRB donned and all needs in reach.   Session Two: Pt seen for OT ADL bathing/dressing session. Pt sitting up in w/Howard upon arrival, agreeable to tx session. Bathing/dressing completed from w/Howard level at sink, pt declining shower this P.M. Hand over hand assist provided for using R UE to wash L with active flexion/extenion noted for rubbing up and down arm. Following VCs for hemi technique, pt able to don shirt and pants with overall min A and steadying assist for standing at sink to pull pants up.  Trialed standing with use of hemi walker due to pain in R shoulder following standing at standard walker. Pt requires min-mod A to stand, and CGA this P.M. For static standing balance with use of hemi walker. Cont to require assist for positioning and stabilization of R knee during standing due to bowing out and resulting foot inversion. Pt returned to w/Howard at end of session, left seated with all needs in reach.   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   Therapy/Group: Individual Therapy  Brandon Howard, Brandon Howard 02/11/2017, 7:14 AM

## 2017-02-11 NOTE — Progress Notes (Signed)
Speech Language Pathology Daily Session Note  Patient Details  Name: Brandon Howard C Foushee MRN: [id number] Date of Birth: 01-29-1941  Today's Date: 02/11/2017 SLP Individual Time: 0930-1000 SLP Individual Time Calculation (min): 30 min  Short Term Goals: Week 1: SLP Short Term Goal 1 (Week 1): Pt will communicate basic wants and needs via multimodal communication with Max A cues.  SLP Short Term Goal 2 (Week 1): Pt will utilize speech intelligibility strategies to achieve ~ 50% intelligibility at the word level with Max A cues.  SLP Short Term Goal 3 (Week 1): Pt will answer yes/no questions using multimodalities with ~ 50% accuracy and Mod A cues.  SLP Short Term Goal 4 (Week 1): Pt will consume dysphagia 3 and nectar thick liquids with minimal overt s/s of aspiration and Max A cues for use of compensatory swallow strategies.  SLP Short Term Goal 5 (Week 1): Pt will demonstrate overt s/s of aspiration with trials of ice chips to demonstrate readiness for repeat instrumental swallow study.   Skilled Therapeutic Interventions: Skilled ST services focused on dysphagia goals. SLP facilitated trials of thin liquid via TSP and cup sips, pt demonstrated no overt s/s aspiration and required Mod A verbal cues to follow swallow precautions. Nurse tech reports pt coughing on tray, possibly due to impulsivity. Pt was left in room in chair with call bell within reach. Recommend to continue ST services.      Function:  Eating Eating   Modified Consistency Diet: Yes Eating Assist Level: Set up assist for;Supervision or verbal cues   Eating Set Up Assist For: Opening containers       Cognition Comprehension Comprehension assist level: Understands basic 75 - 89% of the time/ requires cueing 10 - 24% of the time  Expression   Expression assist level: Expresses basis less than 25% of the time/requires cueing >75% of the time.  Social Interaction Social Interaction assist level: Interacts appropriately 25  - 49% of time - Needs frequent redirection.  Problem Solving Problem solving assist level: Solves basic less than 25% of the time - needs direction nearly all the time or does not effectively solve problems and may need a restraint for safety  Memory Memory assist level: Recognizes or recalls less than 25% of the time/requires cueing greater than 75% of the time    Pain Pain Assessment Pain Assessment: No/denies pain  Therapy/Group: Individual Therapy  Juvenal Umar  [facility] 02/11/2017, 5:28 PM

## 2017-02-11 NOTE — Progress Notes (Signed)
Physical Therapy Session Note  Patient Details  Name: Brandon Howard MRN: [id number] Date of Birth: 07-Feb-1941  Today's Date: 02/11/2017 PT Individual Time: 0805-0905 PT Individual Time Calculation (min): 60 min   Short Term Goals: Week 1:  PT Short Term Goal 1 (Week 1): Pt will perform supine to sit and return with min assist. PT Short Term Goal 2 (Week 1):  (Pt will perform sit to stand with min assist) PT Short Term Goal 3 (Week 1):  (Pt will perform bed to chair transfer with min assist.)  Skilled Therapeutic Interventions/Progress Updates: Pt presented in bed agreeable to therapy. Pt performed rolling L/R with supervision and use of bed rails for changing brief and peri-hygiene. Performed supine to sit minA with use of features, modA scooting to EOB. Performed sit to stand minA with cues for hand placement. Pt required mod cues for increased quad activation on RLE to allow heel to remain on floor. PTA provided maxA for completing LB dressing in standing. Performed squat pivot transfer to w/c modA. Pt propelled to rehab gym 139f with supervision and x 2 rest breaks. Performed sit to stand at parallel bars with visual feedback for midline and manual facilitation for R quad control. Performed standing balance reach outside BOS for increased standing tolerance reaching and placing cards x 3 min. Pt able to complete 7/9 cards without assist. Performed squat pivot modA to mat and performed scooting L/R on mat. Pt required minA for scooting to L, modA scooting to R with cues for increased anterior wt shift and LLE wt bearing to facilitate scoot. Returned to w/c via squat pivot requiring same assist as prior and returned to room remaining in w/c with call bell within reach and current needs met.      Therapy Documentation Precautions:  Precautions Precautions: Fall Restrictions Weight Bearing Restrictions: No Other Position/Activity Restrictions: Dysarthria from prior stroke with prior R  sided weakness  See Function Navigator for Current Functional Status.   Therapy/Group: Individual Therapy  Brandon Howard  Brandon Howard, PTA  02/11/2017, 12:15 PM

## 2017-02-12 ENCOUNTER — Inpatient Hospital Stay (HOSPITAL_COMMUNITY): Payer: Medicare HMO | Admitting: Occupational Therapy

## 2017-02-12 ENCOUNTER — Inpatient Hospital Stay (HOSPITAL_COMMUNITY): Payer: Medicare HMO | Admitting: Speech Pathology

## 2017-02-12 ENCOUNTER — Inpatient Hospital Stay (HOSPITAL_COMMUNITY): Payer: Medicare HMO

## 2017-02-12 NOTE — Progress Notes (Signed)
Subjective/Complaints: Severe dysarthria- was able to express breakfast is being heated up  ROS: pt denies nausea, vomiting, diarrhea, cough, shortness of breath or chest pain   Objective: Vital Signs: Blood pressure 131/79, pulse 76, temperature 98.2 F (36.8 C), temperature source Oral, resp. rate 17, height 5\' 7"  (1.702 m), weight 69.9 kg (154 lb 1.4 oz), SpO2 98 %. No results found. Results for orders placed or performed during the hospital encounter of 02/04/17 (from the past 72 hour(s))  Basic metabolic panel     Status: Abnormal   Collection Time: 02/09/17  9:55 AM  Result Value Ref Range   Sodium 140 135 - 145 mmol/L   Potassium 4.1 3.5 - 5.1 mmol/L   Chloride 108 101 - 111 mmol/L   CO2 24 22 - 32 mmol/L   Glucose, Bld 139 (H) 65 - 99 mg/dL   BUN 18 6 - 20 mg/dL   Creatinine, Ser 02/11/17 0.61 - 1.24 mg/dL   Calcium 8.9 8.9 - 6.67 mg/dL   GFR calc non Af Amer >60 >60 mL/min   GFR calc Af Amer >60 >60 mL/min    Comment: (NOTE) The eGFR has been calculated using the CKD EPI equation. This calculation has not been validated in all clinical situations. eGFR's persistently <60 mL/min signify possible Chronic Kidney Disease.    Anion gap 8 5 - 15  Basic metabolic panel     Status: Abnormal   Collection Time: 02/11/17  3:59 AM  Result Value Ref Range   Sodium 140 135 - 145 mmol/L   Potassium 3.9 3.5 - 5.1 mmol/L   Chloride 105 101 - 111 mmol/L   CO2 27 22 - 32 mmol/L   Glucose, Bld 92 65 - 99 mg/dL   BUN 15 6 - 20 mg/dL   Creatinine, Ser 02/13/17 0.61 - 1.24 mg/dL   Calcium 8.6 (L) 8.9 - 10.3 mg/dL   GFR calc non Af Amer >60 >60 mL/min   GFR calc Af Amer >60 >60 mL/min    Comment: (NOTE) The eGFR has been calculated using the CKD EPI equation. This calculation has not been validated in all clinical situations. eGFR's persistently <60 mL/min signify possible Chronic Kidney Disease.    Anion gap 8 5 - 15  CBC     Status: None   Collection Time: 02/11/17  3:59 AM  Result  Value Ref Range   WBC 6.8 4.0 - 10.5 K/uL   RBC 5.01 4.22 - 5.81 MIL/uL   Hemoglobin 15.2 13.0 - 17.0 g/dL   HCT 02/13/17 85.3 - 17.4 %   MCV 92.8 78.0 - 100.0 fL   MCH 30.3 26.0 - 34.0 pg   MCHC 32.7 30.0 - 36.0 g/dL   RDW 18.7 30.3 - 02.5 %   Platelets 315 150 - 400 K/uL     HEENT: Normocephalic, atraumatic Cardio: RRR without murmur. No JVD   Resp: CTA Bilaterally without wheezes or rales. Normal effort   GI: BS positive and ND Skin:   Intact. Warm and dry. Neuro:  Right facial droop , dysarthria Cranial Nerve Abnormalities Right central 7 Severe dysarthria Motor 3- to 3/5, right deltoid, biceps, triceps, grip with spastic tone. Needs cueing to initiate with right side. 4+/5 right hip flexor, knee extensor, ankle dorsiflexor and LUE/LLE: 5/5 proximal to distal Musc/Skel:  No edema, no tenderness. Gen. no acute distress.  .   Assessment/Plan: 1. 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 Physiatrist is providing close team supervision and 24 hour management of active medical problems listed below. Physiatrist and rehab team continue to assess barriers to discharge/monitor patient progress toward functional and medical goals. FIM: Function - Bathing Position: Wheelchair/chair at sink Body parts bathed by patient: Chest, Abdomen, Right upper leg, Left upper leg, Left lower leg, Right lower leg, Right arm Body parts bathed by helper: Left arm, Front perineal area, Buttocks, Back Assist Level: Touching or steadying assistance(Pt > 75%)  Function- Upper Body Dressing/Undressing What is the patient wearing?: Pull over shirt/dress Pull over shirt/dress - Perfomed by patient: Thread/unthread right sleeve, Thread/unthread left sleeve, Put head through opening, Pull shirt over trunk Pull over shirt/dress - Perfomed by helper: Thread/unthread left sleeve, Put head  through opening Assist Level: Touching or steadying assistance(Pt > 75%) Function - Lower Body Dressing/Undressing What is the patient wearing?: Pants, Non-skid slipper socks Position: Wheelchair/chair at sink Pants- Performed by patient: Thread/unthread right pants leg, Thread/unthread left pants leg, Pull pants up/down Pants- Performed by helper: Pull pants up/down, Fasten/unfasten pants Non-skid slipper socks- Performed by patient: Don/doff right sock, Don/doff left sock Assist for footwear: Supervision/touching assist Assist for lower body dressing: Touching or steadying assistance (Pt > 75%)  Function - Toileting Toileting steps completed by helper: Adjust clothing prior to toileting, Performs perineal hygiene, Adjust clothing after toileting Toileting Assistive Devices: Grab bar or rail Assist level: Touching or steadying assistance (Pt.75%)  Function - Air cabin crew transfer assistive device: Grab bar Assist level to toilet: Moderate assist (Pt 50 - 74%/lift or lower) Assist level from toilet: Moderate assist (Pt 50 - 74%/lift or lower)  Function - Chair/bed transfer Chair/bed transfer method: Squat pivot Chair/bed transfer assist level: Moderate assist (Pt 50 - 74%/lift or lower) Chair/bed transfer assistive device: Bedrails, Armrests Chair/bed transfer details: Manual facilitation for weight shifting, Manual facilitation for placement, Verbal cues for precautions/safety, Verbal cues for technique  Function - Locomotion: Wheelchair Will patient use wheelchair at discharge?: Yes Type: Manual Max wheelchair distance: 166f Assist Level: Touching or steadying assistance (Pt > 75%) Assist Level: Touching or steadying assistance (Pt > 75%) Wheel 150 feet activity did not occur: Safety/medical concerns (Poor planning limits efficiency) Assist Level: Touching or steadying assistance (Pt > 75%) Turns around,maneuvers to table,bed, and toilet,negotiates 3% grade,maneuvers  on rugs and over doorsills: No Function - Locomotion: Ambulation Assistive device: No device (hand held) Max distance:  (5 ft) Assist level: 2 helpers Walk 10 feet activity did not occur: Safety/medical concerns (Dependence) Walk 50 feet with 2 turns activity did not occur: Safety/medical concerns Walk 150 feet activity did not occur: Safety/medical concerns Walk 10 feet on uneven surfaces activity did not occur: Safety/medical concerns  Function - Comprehension Comprehension: Auditory Comprehension assist level: Understands basic 75 - 89% of the time/ requires cueing 10 - 24% of the time  Function - Expression Expression: Verbal Expression assist level: Expresses basis less than 25% of the time/requires cueing >75% of the time.  Function - Social Interaction Social Interaction assist level: Interacts appropriately 25 - 49% of time - Needs frequent redirection.  Function - Problem Solving Problem solving assist level: Solves basic less than 25% of the time - needs direction nearly all the time or does not effectively solve problems and may need a restraint for safety  Function - Memory Memory assist level: Recognizes or recalls less than 25% of the time/requires cueing greater than 75% of the time Patient normally able to recall (  first 3 days only): None of the above  Medical Problem List and Plan: 1.  Dysarthria and dysphagia as well as decline in mobility and ability to carry out ADL tasks. secondary to bilateral CVA.  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  DVT Prophylaxis/Anticoagulation: Pharmaceutical: Lovenox, monitor platelets, platelets were normal on 02/11/2017 3. Pain Management: N/A 4. Mood: LCSW to follow for evaluation and support.  5. Neuropsych: This patient is not fully capable of making decisions on his own behalf. 6. Skin/Wound Care: routine pressure relief measures.  7. Fluids/Electrolytes/Nutrition:  Monitor I/Os. Monitor for signs of dehydration.    D3 Nectar, advance as tolerated  Bmet normal on 08/15 8. HTN: Meds resumed. Monitor for hypotension  in setting of proximal BA stenosis.   Controlled 8/16 Vitals:   02/11/17 1500 02/12/17 0319  BP: 120/67 131/79  Pulse: 74 76  Resp: 18 17  Temp: 98.1 F (36.7 C) 98.2 F (36.8 C)  SpO2: 99% 98%   9. Hyperlipidemia: On Zocor.  10. Polysubstance abuse: Counsel on importance of cessation. 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 On ASA, Plavix and Zocor.   LOS (Days) 8 A FACE TO FACE EVALUATION WAS PERFORMED  Braulio Kiedrowski E 02/12/2017, 8:20 AM

## 2017-02-12 NOTE — Progress Notes (Signed)
Physical Therapy Session Note  Patient Details  Name: Brandon Howard MRN: [id number] Date of Birth: May 04, 1941  Today's Date: 02/12/2017 PT Individual Time: 1300-1330 PT Individual Time Calculation (min): 30 min   Short Term Goals: Week 1:  PT Short Term Goal 1 (Week 1): Pt will perform supine to sit and return with min assist. PT Short Term Goal 2 (Week 1):  (Pt will perform sit to stand with min assist) PT Short Term Goal 3 (Week 1):  (Pt will perform bed to chair transfer with min assist.)  Skilled Therapeutic Interventions/Progress Updates:    Session focused on neuro re-ed to address postural control, balance, and motor control in BLE during sit <> stands and blocked practice transfer training. Pt requires overall mod assist for facilitation of weightshift and to block RLE (unable to maintain foot flat and may benefit from aircast to protect ankle - discussed also with primary OT and asked family to bring in shoes) during transfer. Sit <> stands with hemi walker with min assist with decreased functional weightbearing over RLE. Seated engaged in weightbearing through RUE and elbow with focus on core activation to return to upright seated position and use of RUE x 5 reps.   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   Therapy/Group: Individual Therapy  Karolee StampsGray, Marlon Vonruden Darrol PokeBrescia  Keatyn Luck B. Heather Mckendree, PT, DPT  02/12/2017, 3:57 PM

## 2017-02-12 NOTE — Plan of Care (Signed)
Problem: RH BOWEL ELIMINATION Goal: RH STG MANAGE BOWEL WITH ASSISTANCE STG Manage Bowel with Min Assistance.  Outcome: Not Progressing  02/12/17 0332  Bowel Management Goals  STG: Pt will manage bowels with assistance 2-Maximum assistance    Problem: RH BLADDER ELIMINATION Goal: RH STG MANAGE BLADDER WITH ASSISTANCE STG Manage Bladder With Min Assistance  Outcome: Not Progressing  02/12/17 0332  Bladder Management Goals  STG: Pt will manage bladder with assistance 2-Maximum assistance    Problem: RH SAFETY Goal: RH STG DECREASED RISK OF FALL WITH ASSISTANCE STG Decreased Risk of Fall With BJ'sMin Assistance.  Outcome: Progressing Pt remained in bed, and utilized the call light when he needed the urinal   Problem: RH PAIN MANAGEMENT Goal: RH STG PAIN MANAGED AT OR BELOW PT'S PAIN GOAL Patient to have pain level <2 with min assist   Outcome: Progressing Pt was free of pain throughout the night.

## 2017-02-12 NOTE — Progress Notes (Addendum)
Social Work Patient ID: KERRIGAN GLENDENING, male   DOB: 1940-11-25, 76 y.o.   MRN: [id number]  Met with pt, sister brother in-law and son to discuss team conference and pt's care At discharge from [place]. Discussed the options and what realistically each of them could do to assist pt. Sister and her husband live in [place] so she will start the Medicaid application since she Feels he is eligible due to his limited resources. Son does need to work and can not be with him 24 hr a day. Discussed the option of going to a NH for a short time to continue his therapies and Hopefully progress to the point he can return home to his apartment. Gave family the NH list and they will begin looking into this option. Will work on the best plan for pt, family to get back with worker Regarding what they decide. Pt is on board and aware of his need for continued care at discharge.

## 2017-02-12 NOTE — Progress Notes (Signed)
Speech Language Pathology Weekly Progress and Session Note  Patient Details  Name: Brandon Howard MRN: [id number] Date of Birth: Jan 20, 1941  Beginning of progress report period: February 05, 2017 End of progress report period: February 12, 2017  Today's Date: 02/12/2017 SLP Individual Time: 1000-1100 SLP Individual Time Calculation (min): 60 min  Short Term Goals: Week 1: SLP Short Term Goal 1 (Week 1): Pt will communicate basic wants and needs via multimodal communication with Max A cues.  SLP Short Term Goal 1 - Progress (Week 1): Not met SLP Short Term Goal 2 (Week 1): Pt will utilize speech intelligibility strategies to achieve ~ 50% intelligibility at the word level with Max A cues.  SLP Short Term Goal 2 - Progress (Week 1): Not met SLP Short Term Goal 3 (Week 1): Pt will answer yes/no questions using multimodalities with ~ 50% accuracy and Mod A cues.  SLP Short Term Goal 3 - Progress (Week 1): Met SLP Short Term Goal 4 (Week 1): Pt will consume dysphagia 3 and nectar thick liquids with minimal overt s/s of aspiration and Max A cues for use of compensatory swallow strategies.  SLP Short Term Goal 4 - Progress (Week 1): Met SLP Short Term Goal 5 (Week 1): Pt will demonstrate overt s/s of aspiration with trials of ice chips to demonstrate readiness for repeat instrumental swallow study.  SLP Short Term Goal 5 - Progress (Week 1): Met    New Short Term Goals: Week 2: SLP Short Term Goal 1 (Week 2): Pt will communicate basic wants and needs via multimodal communication with Max A cues.  SLP Short Term Goal 2 (Week 2): Pt will utilize speech intelligibility strategies to achieve ~ 50% intelligibility at the word level with Max A cues.  SLP Short Term Goal 3 (Week 2): Pt will answer yes/no questions using multimodalities with ~ 50% accuracy and Min A cues.  SLP Short Term Goal 4 (Week 2): Pt will demonstrate minimal s/s of aspiration with trials of ice chips to demonstrate readiness for  repeat instrumental swallow study.   Weekly Progress Updates: Pt has made minimal progress towards goals and as a result he has met 1 of 5 STGs. Pt continues to require skilled ST to address ability to express himself and advance his current diet.      Intensity: Minumum of 1-2 x/day, 30 to 90 minutes Frequency: 3 to 5 out of 7 days Duration/Length of Stay: 3 to 3.5 weeks Treatment/Interventions: Cognitive remediation/compensation;Cueing hierarchy;Dysphagia/aspiration precaution training;Functional tasks;Internal/external aids;Multimodal communication approach;Oral motor exercises;Speech/Language facilitation;Therapeutic Activities;Patient/family education   Daily Session  Skilled Therapeutic Interventions: Skilled treatment session focused on cognition and dysphagia goals. SLP facilitated session by providing skilled observation of pt consuming ice chips and tsp thin water. Pt with consistent cough and throat clear. Pt required Total A to communicate need to use bathroom. Pt was left upright in wheelchair, chair alarm on and safety belt donned with all needs within reach.      Function:   Eating Eating   Modified Consistency Diet: No (Trials with SLP only) Eating Assist Level: Set up assist for;Supervision or verbal cues   Eating Set Up Assist For: Opening containers Helper Scoops Food on Utensil: Occasionally     Cognition Comprehension Comprehension assist level: Understands basic 50 - 74% of the time/ requires cueing 25 - 49% of the time;Understands basic 75 - 89% of the time/ requires cueing 10 - 24% of the time  Expression   Expression assist level: Expresses basis  less than 25% of the time/requires cueing >75% of the time.  Social Interaction Social Interaction assist level: Interacts appropriately 25 - 49% of time - Needs frequent redirection.  Problem Solving Problem solving assist level: Solves basic less than 25% of the time - needs direction nearly all the time or does not  effectively solve problems and may need a restraint for safety  Memory Memory assist level: Recognizes or recalls less than 25% of the time/requires cueing greater than 75% of the time   General    Pain    Therapy/Group: Individual Therapy  Ticara Waner 02/12/2017, 3:33 PM

## 2017-02-12 NOTE — Progress Notes (Signed)
Occupational Therapy Session Note  Patient Details  Name: Brandon Howard MRN: [id number] Date of Birth: 09/29/1940  Today's Date: 02/12/2017 OT Individual Time: 7829-56210730-0815 OT Individual Time Calculation (min): 45 min    Short Term Goals: Week 2:  OT Short Term Goal 1 (Week 2): Pt will complete 2/3 toileting tasks with steadying assist OT Short Term Goal 2 (Week 2): Pt will complete 1 grooming task in standing with min A for steadying in order to increase functional standing balance and endurance OT Short Term Goal 3 (Week 2): Pt will complete stand pivot transfer with hemi walker and mod A in prep for functional tasks.  OT Short Term Goal 4 (Week 2): Pt will don shirt with set-up assist  Skilled Therapeutic Interventions/Progress Updates:    Pt received supine in bed agreeable to OT tx session. Pt declining bathing this session, agreeable to dressing/grooming ADLs. Pt performs bed mobility to sit EOB with MinA, ModA for squat pivot transfer EOB>w/c. Pt completes grooming ADLs seated in w/c with setup/supervision. Completes UB dressing, demonstrating recall of hemi technique. Pt completes UB dressing using technique with increased time, Min verbal/demonstrational cues to increase ease of task completion. Educated Pt on use of hemi technique for LB dressing as Pt dons shorts threading LLE first. Requires Mod steady assist standing at sink, progressed to MinA while therapist assists to advance shorts over hips. Pt completes 2 additional sit<>stands at RW with overall ModA and assist for RUE management to/from handrest. Pt stands with MinA for approx 1 min each trial, therapist providing support at RLE to decrease bowing/buckling. Pt requesting to return to bed end of session, completing squat pivot with MinA to w/c>EOB, ModA for LE management to return to supine. Pt left with bed alarm set, call bell and needs within reach.   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   Therapy/Group: Individual Therapy  Orlando PennerBreanna L Laban Orourke 02/12/2017, 3:37 PM

## 2017-02-12 NOTE — Progress Notes (Signed)
Occupational Therapy Weekly Progress Note  Patient Details  Name: Brandon Howard MRN: [id number] Date of Birth: [birth year]  Beginning of progress report period: February 05, 2017 End of progress report period: February 12, 2017  Today's Date: 02/12/2017 OT Individual Time: 1100-1200 OT Individual Time Calculation (min): 60 min    Patient has met 3 of 4 short term goals.  Pt making steady progress towards OT goals. Pt's safety awareness has improved greatly as pt more aware of deficits and safety awareness. He cont to be most limited by decreased stabilization and decreased proprioception in L LE, requiring increased assist for safety and positioning of LE during standing tasks and transfers. Pt is not consistently able to advance L LE during transfers and therefore can require up to max A for transfers. He does have movement at all major UE muscle groups, however, due to weakness and dis-coordination is unable to independently use at functional level at this time. It is unclear at this time if pt's son is able to provide needed 24 hr min A at d/c, CSW to discuss with family, but potential for d/c change to SNF if care is unable to be provided.   Patient continues to demonstrate the following deficits:  abnormal posture, apraxia, ataxia, cognitive deficits, hemiplegia affecting dominant side and muscle weakness (generalized) and therefore will continue to benefit from skilled OT intervention to enhance overall performance with BADL and Reduce care partner burden.  Patient progressing toward long term goals..  Continue plan of care.  OT Short Term Goals Week 1:  OT Short Term Goal 1 (Week 1): Pt will complete stand pivot transfer w/c <> toilet with mod A OT Short Term Goal 1 - Progress (Week 1): Partly met OT Short Term Goal 2 (Week 1): Pt will recall and utilize hemi dressing techniques with min VCs  OT Short Term Goal 2 - Progress (Week 1): Met OT Short Term Goal 3 (Week 1): Pt will maintain  static standing balance with min A and UE support in order to decrease caregiver burden during ADL tasks OT Short Term Goal 3 - Progress (Week 1): Met OT Short Term Goal 4 (Week 1): Pt will use R UE at gross assist stabilizer level during functional task with max A OT Short Term Goal 4 - Progress (Week 1): Met Week 2:  OT Short Term Goal 1 (Week 2): Pt will complete 2/3 toileting tasks with steadying assist OT Short Term Goal 2 (Week 2): Pt will complete 1 grooming task in standing with min A for steadying in order to increase functional standing balance and endurance OT Short Term Goal 3 (Week 2): Pt will complete stand pivot transfer with hemi walker and mod A in prep for functional tasks.  OT Short Term Goal 4 (Week 2): Pt will don shirt with set-up assist  Skilled Therapeutic Interventions/Progress Updates:    Pt seen for OT session focusing on neuro muscular re-education with weightbearing through R UE/LE and ADL re-training.  Pt sitting up in w/c upon arrival, agreeable to tx session. He self propelled w/c using hemi technique with supervision throughout unit. Completed card game in standing position with R UE positioned in weightbearing position. Assist required for positioning and stabilization of R knee in order to prevent foot inversion. Pt required to reach to place card to facilitate weightbearing through R UE/LE. Pt able to tolerate x2 trials ~5 minutes each trial with seated rest break provided btwn trials.  Pt self propelled back to room. Completed  stand pivot transfer to w/c with use of grab bar. Required mod-max A for transfer to R due to pt's foot inverting and requiring increased assist to prevent full weightbearing/standing on inverted foot. Pt was able to stand with use of hemi walk for intermittent UE steadying support to pul down pants, required assist to pull pants back up. He completed hand hygiene seated in w/c at sink with min A. Pt left in w/c with all needs in reach, QRB  donned and chair alarm activated.   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   Therapy/Group: Individual Therapy  [name], Amaurie Wandel C 02/12/2017, 7:10 AM

## 2017-02-13 ENCOUNTER — Inpatient Hospital Stay (HOSPITAL_COMMUNITY): Payer: Medicare HMO | Admitting: Occupational Therapy

## 2017-02-13 ENCOUNTER — Inpatient Hospital Stay (HOSPITAL_COMMUNITY): Payer: Medicare HMO | Admitting: Physical Therapy

## 2017-02-13 ENCOUNTER — Inpatient Hospital Stay (HOSPITAL_COMMUNITY): Payer: Medicare HMO | Admitting: Speech Pathology

## 2017-02-13 NOTE — Progress Notes (Signed)
Occupational Therapy Session Note  Patient Details  Name: Brandon Howard MRN: [id number] Date of Birth: 04-24-1941  Today's Date: 02/13/2017 OT Individual Time: 4314-2767 OT Individual Time Calculation (min): 30 min    Short Term Goals: Week 2:  OT Short Term Goal 1 (Week 2): Pt will complete 2/3 toileting tasks with steadying assist OT Short Term Goal 2 (Week 2): Pt will complete 1 grooming task in standing with min A for steadying in order to increase functional standing balance and endurance OT Short Term Goal 3 (Week 2): Pt will complete stand pivot transfer with hemi walker and mod A in prep for functional tasks.  OT Short Term Goal 4 (Week 2): Pt will don shirt with set-up assist  Skilled Therapeutic Interventions/Progress Updates:    OT treatment session focused on R NMR and NMES. 1:1 NMES applied to wrist extensors. Incorporated shoulder FF and NMR with weight bearing towel pushes on table. Pt able to grasp wash cloth and demonstrated come increase in shoulder activation. Pt returned to room at end of session and left seated in wc with needs met and safety belt on .  Ratio 1:1 Rate 35 pps Waveform- Asymmetric Ramp 1.0 Pulse 300 Intensity- 20 Duration -   15 mins No adverse reactions after treatment and is skin intact.    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   Therapy/Group: Individual Therapy  Valma Cava 02/13/2017, 2:52 PM

## 2017-02-13 NOTE — Progress Notes (Signed)
Physical Therapy Weekly Progress Note  Patient Details  Name: Brandon Howard MRN: [id number] Date of Birth: March 28, 1941  Beginning of progress report period: February 05, 2017 End of progress report period: February 12, 2017  Today's Date: 02/13/2017 PT Individual Time: 1515-1610 PT Individual Time Calculation (min): 55 min   Patient has met 2 of 3 short term goals. Pt is performing bed mobility at supervision level for rolling and Min A level for sit<>supine. He is consistently performing sit<>stands w/ Min A, however requires Mod A for transfers secondary to decrease balance, motor planning, and LE strength. He has performed sit<>stands w/ hemiwalker, but has not initiated gait as of yet. He requires frequent verbal cues for R weight shifting and manual cues to facilitate R knee extension and LE foot placement during all functional movements.   Patient continues to demonstrate the following deficits muscle weakness, decreased cardiorespiratoy endurance, decreased coordination and decreased motor planning and decreased standing balance, decreased postural control, hemiplegia and decreased balance strategies and therefore will continue to benefit from skilled PT intervention to increase functional independence with mobility.  Patient progressing toward long term goals..  Continue plan of care.  PT Short Term Goals Week 1:  PT Short Term Goal 1 (Week 1): Pt will perform supine to sit and return with min assist. PT Short Term Goal 1 - Progress (Week 1): Met PT Short Term Goal 2 (Week 1): Pt will perform sit to stand with min assist PT Short Term Goal 2 - Progress (Week 1): Met PT Short Term Goal 3 (Week 1): Pt will perform bed to chair transfer with min assist. PT Short Term Goal 3 - Progress (Week 1): Progressing toward goal Week 2:  PT Short Term Goal 1 (Week 2): Pt will perform bed<>chair transfer w/ Min A via stand pivot transfer PT Short Term Goal 2 (Week 2): Pt will ambulate 5' w/  hemiwalker Max A  PT Short Term Goal 3 (Week 2): Pt will demonstrate dynamic sitting balance w/o UE support  Skilled Therapeutic Interventions/Progress Updates:   Pt in w/c upon arrival and agreeable to therapy, no c/o pain.   Worked on functional mobility and transferred w/c<>bed w/ Mod A - unilateral HHA for balance and verbal cues for technique. Practiced performing sit<>supine by using LLE to lift and lower RLE, pt able to perform transfer w/ Min A and verbal, tactile, and visual cues for technique. Practiced sit<>stands w/ close supervision using hemiwalker, x4 w/ manual and tactile cues to facilitate R knee extension in stance.   Pt self-propelled w/c to/from gym w/ supervision utilizing R UE/LEs to work on functional endurance and independence.   1:1 NMES to R quad musculature w/ bolster under knees in supine, 15 min  @ 35 pps, asymmetric waveform, 1.0 ramp, 300 pulse, intensity 24. Pt tolerated treatment and was given verbal cues to "kick foot" during quad contraction. No adverse reaction to skin.   Ended session supine in bed w/ bed alarm on, all needs met and call bell within reach.  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  Therapy/Group: Individual Therapy  Guiliana Shor K Arnette 02/13/2017, 4:17 PM

## 2017-02-13 NOTE — Progress Notes (Signed)
Speech Language Pathology Daily Session Note  Patient Details  Name: Brandon Howard MRN: [id number] Date of Birth: 05/18/1941  Today's Date: 02/13/2017 SLP Individual Time: 1345-1415 SLP Individual Time Calculation (min): 30 min  Short Term Goals: Week 2: SLP Short Term Goal 1 (Week 2): Pt will communicate basic wants and needs via multimodal communication with Max A cues.  SLP Short Term Goal 2 (Week 2): Pt will utilize speech intelligibility strategies to achieve ~ 50% intelligibility at the word level with Max A cues.  SLP Short Term Goal 3 (Week 2): Pt will answer yes/no questions using multimodalities with ~ 50% accuracy and Min A cues.  SLP Short Term Goal 4 (Week 2): Pt will demonstrate minimal s/s of aspiration with trials of ice chips to demonstrate readiness for repeat instrumental swallow study.   Skilled Therapeutic Interventions: Skilled treatment session focused on speech goals. Upon arrival, patient had a baseline wet vocal quality and required Max A verbal cues to self-monitor and correct throughout session. SLP facilitated session by providing Max A verbal cues for utilization of speech intelligibility strategies to achieve 100% intelligibility at the word level and 100% accuracy for naming. However, patient was only ~25-50% intelligible at the phrase level despite Max A multimodal cues. Patient left upright in wheelchair with quick release belt in place. Continue with current plan of care.      Function:   Cognition Comprehension Comprehension assist level: Understands basic 50 - 74% of the time/ requires cueing 25 - 49% of the time  Expression   Expression assist level: Expresses basis less than 25% of the time/requires cueing >75% of the time.  Social Interaction Social Interaction assist level: Interacts appropriately less than 25% of the time. May be withdrawn or combative.  Problem Solving Problem solving assist level: Solves basic less than 25% of the time - needs  direction nearly all the time or does not effectively solve problems and may need a restraint for safety  Memory Memory assist level: Recognizes or recalls less than 25% of the time/requires cueing greater than 75% of the time    Pain No/Denies Pain   Therapy/Group: Individual Therapy  Sully Manzi 02/13/2017, 4:01 PM

## 2017-02-13 NOTE — Progress Notes (Signed)
Subjective/Complaints:  No issues overnite, pt incont of urine, had difficulty managing urinal per CNA  ROS: pt denies nausea, vomiting, diarrhea, cough, shortness of breath or chest pain   Objective: Vital Signs: Blood pressure 131/74, pulse 68, temperature 98.1 F (36.7 C), temperature source Oral, resp. rate 17, height _0  (1.702 m), weight 69.9 kg (154 lb 1.4 oz), SpO2 97 %. No results found. Results for orders placed or performed during the hospital encounter of 02/04/17 (from the past 72 hour(s))  Basic metabolic panel     Status: Abnormal   Collection Time: 02/11/17  3:59 AM  Result Value Ref Range   Sodium 140 135 - 145 mmol/L   Potassium 3.9 3.5 - 5.1 mmol/L   Chloride 105 101 - 111 mmol/L   CO2 27 22 - 32 mmol/L   Glucose, Bld 92 65 - 99 mg/dL   BUN 15 6 - 20 mg/dL   Creatinine, Ser 0.73 0.61 - 1.24 mg/dL   Calcium 8.6 (L) 8.9 - 10.3 mg/dL   GFR calc non Af Amer >60 >60 mL/min   GFR calc Af Amer >60 >60 mL/min    Comment: (NOTE) The eGFR has been calculated using the CKD EPI equation. This calculation has not been validated in all clinical situations. eGFR's persistently <60 mL/min signify possible Chronic Kidney Disease.    Anion gap 8 5 - 15  CBC     Status: None   Collection Time: 02/11/17  3:59 AM  Result Value Ref Range   WBC 6.8 4.0 - 10.5 K/uL   RBC 5.01 4.22 - 5.81 MIL/uL   Hemoglobin 15.2 13.0 - 17.0 g/dL   HCT 46.5 39.0 - 52.0 %   MCV 92.8 78.0 - 100.0 fL   MCH 30.3 26.0 - 34.0 pg   MCHC 32.7 30.0 - 36.0 g/dL   RDW 13.5 11.5 - 15.5 %   Platelets 315 150 - 400 K/uL     HEENT: Normocephalic, atraumatic Cardio: RRR without murmur. No JVD   Resp: CTA Bilaterally without wheezes or rales. Normal effort   GI: BS positive and ND Skin:   Intact. Warm and dry. Neuro:  Right facial droop , dysarthria Cranial Nerve Abnormalities Right central 7 Severe dysarthria Motor 3- to 3/5, right deltoid, biceps, triceps, grip with spastic tone. Needs cueing to  initiate with right side. 4+/5 right hip flexor, knee extensor, ankle dorsiflexor and LUE/LLE: 5/5 proximal to distal Musc/Skel:  No edema, no tenderness. Gen. no acute distress.  .   Assessment/Plan: 1. 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 Physiatrist is providing close team supervision and 24 hour management of active medical problems listed below. Physiatrist and rehab team continue to assess barriers to discharge/monitor patient progress toward functional and medical goals. FIM: Function - Bathing Position: Wheelchair/chair at sink Body parts bathed by patient: Chest, Abdomen, Right upper leg, Left upper leg, Left lower leg, Right lower leg, Right arm Body parts bathed by helper: Left arm, Front perineal area, Buttocks, Back Assist Level: Touching or steadying assistance(Pt > 75%)  Function- Upper Body Dressing/Undressing What is the patient wearing?: Pull over shirt/dress Pull over shirt/dress - Perfomed by patient: Thread/unthread right sleeve, Thread/unthread left sleeve, Put head through opening, Pull shirt over trunk Pull over shirt/dress - Perfomed by helper: Thread/unthread left sleeve, Put head through opening Assist Level: Touching or steadying assistance(Pt > 75%) Function - Lower Body Dressing/Undressing What  is the patient wearing?: Pants Position: Wheelchair/chair at sink Pants- Performed by patient: Thread/unthread right pants leg, Thread/unthread left pants leg Pants- Performed by helper: Pull pants up/down, Fasten/unfasten pants Non-skid slipper socks- Performed by patient: Don/doff right sock, Don/doff left sock Assist for footwear: Supervision/touching assist Assist for lower body dressing: Touching or steadying assistance (Pt > 75%)  Function - Toileting Toileting steps completed by patient: Adjust clothing prior to toileting,  Performs perineal hygiene Toileting steps completed by helper: Adjust clothing after toileting Toileting Assistive Devices: Grab bar or rail Assist level: Touching or steadying assistance (Pt.75%)  Function - Air cabin crew transfer assistive device: Grab bar Assist level to toilet: Moderate assist (Pt 50 - 74%/lift or lower) Assist level from toilet: Moderate assist (Pt 50 - 74%/lift or lower)  Function - Chair/bed transfer Chair/bed transfer method: Stand pivot Chair/bed transfer assist level: Moderate assist (Pt 50 - 74%/lift or lower) Chair/bed transfer assistive device: Armrests Chair/bed transfer details: Manual facilitation for weight shifting, Manual facilitation for placement, Verbal cues for precautions/safety, Verbal cues for technique  Function - Locomotion: Wheelchair Will patient use wheelchair at discharge?: Yes Type: Manual Max wheelchair distance: 63' Assist Level: Touching or steadying assistance (Pt > 75%) Assist Level: Touching or steadying assistance (Pt > 75%) Wheel 150 feet activity did not occur: Safety/medical concerns (Poor planning limits efficiency) Assist Level: Touching or steadying assistance (Pt > 75%) Turns around,maneuvers to table,bed, and toilet,negotiates 3% grade,maneuvers on rugs and over doorsills: No Function - Locomotion: Ambulation Assistive device: No device (hand held) Max distance:  (5 ft) Assist level: 2 helpers Walk 10 feet activity did not occur: Safety/medical concerns (Dependence) Walk 50 feet with 2 turns activity did not occur: Safety/medical concerns Walk 150 feet activity did not occur: Safety/medical concerns Walk 10 feet on uneven surfaces activity did not occur: Safety/medical concerns  Function - Comprehension Comprehension: Auditory Comprehension assist level: Understands basic 50 - 74% of the time/ requires cueing 25 - 49% of the time  Function - Expression Expression: Verbal Expression assist level:  Expresses basis less than 25% of the time/requires cueing >75% of the time.  Function - Social Interaction Social Interaction assist level: Interacts appropriately less than 25% of the time. May be withdrawn or combative.  Function - Problem Solving Problem solving assist level: Solves basic less than 25% of the time - needs direction nearly all the time or does not effectively solve problems and may need a restraint for safety  Function - Memory Memory assist level: Recognizes or recalls less than 25% of the time/requires cueing greater than 75% of the time Patient normally able to recall (first 3 days only): None of the above  Medical Problem List and Plan: 1.  Dysarthria and dysphagia as well as decline in mobility and ability to carry out ADL tasks. secondary to bilateral CVA.  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  DVT Prophylaxis/Anticoagulation: Pharmaceutical: Lovenox, monitor platelets, platelets were normal on 02/11/2017 3. Pain Management: N/A 4. Mood: LCSW to follow for evaluation and support.  5. Neuropsych: This patient is not fully capable of making decisions on his own behalf. 6. Skin/Wound Care: routine pressure relief measures.  7. Fluids/Electrolytes/Nutrition: Monitor I/Os. Monitor for signs of dehydration.    D3 Nectar, Intake ~318m fluid, 70-85% meals  Bmet normal on 08/15 8. HTN: Meds resumed. Monitor for hypotension  in setting of proximal BA stenosis.   Controlled 8/17 Vitals:  02/12/17 1427 02/13/17 0300  BP: 124/67 131/74  Pulse: 87 68  Resp: 18 17  Temp: 98.8 F (37.1 C) 98.1 F (36.7 C)  SpO2: 92% 97%   9. Hyperlipidemia: On Zocor.  10. Polysubstance abuse: Counsel on importance of cessation. 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 On ASA, Plavix and Zocor.   LOS (Days) 9 A FACE TO FACE EVALUATION WAS PERFORMED  KIRSTEINS,ANDREW  E 02/13/2017, 8:12 AM

## 2017-02-13 NOTE — Progress Notes (Signed)
Occupational Therapy Session Note  Patient Details  Name: Brandon Howard MRN: [id number] Date of Birth: 1941/05/21  Today's Date: 02/13/2017 OT Individual Time: 1100-1200 OT Individual Time Calculation (min): 60 min    Short Term Goals: Week 2:  OT Short Term Goal 1 (Week 2): Pt will complete 2/3 toileting tasks with steadying assist OT Short Term Goal 2 (Week 2): Pt will complete 1 grooming task in standing with min A for steadying in order to increase functional standing balance and endurance OT Short Term Goal 3 (Week 2): Pt will complete stand pivot transfer with hemi walker and mod A in prep for functional tasks.  OT Short Term Goal 4 (Week 2): Pt will don shirt with set-up assist  Skilled Therapeutic Interventions/Progress Updates:    Pt seen for OT ADL bathing/dressing session. Pt sitting up in w/c upon arrival, agreeable to tx session and bathing at shower level, able to respond with intelligble one wrod answers and head nods. He completed min A stand pivot transfer into shower with use of grab bars and increased time for movement of R LE. He bathed seated on 3/1 BSC, crossing ankle over knee to wash feet and completing buttock hygiene through toilet hole. He dressed seated in w/c, increasing time for clothing management and threading pants on, however, with increased time was albe to complete independently.Stood with mod A at hemi walker to pull pants up. Pt with no awareness of posterior lean requiring multimodal cuing for correction.  Completed oral care with use of suction toothbrush. Therapist assisted with water protocol per SLP orders. Pt consumed 3 teaspoons via spoon, 1 delayed dry cough, otherwise tolerated well. Pt left seated in w/c at end of session, all needs in reach and QRB donned.   Therapy Documentation Precautions:  Precautions Precautions: Fall Restrictions Weight Bearing Restrictions: No Other Position/Activity Restrictions: Dysarthria from prior stroke  with prior R sided weakness Pain:   No/denies pain  See Function Navigator for Current Functional Status.   Therapy/Group: Individual Therapy  [name], Cellie Dardis C 02/13/2017, 7:14 AM

## 2017-02-14 ENCOUNTER — Inpatient Hospital Stay (HOSPITAL_COMMUNITY): Payer: Medicare HMO | Admitting: Physical Therapy

## 2017-02-14 NOTE — Progress Notes (Signed)
Physical Therapy Session Note  Patient Details  Name: Brandon Howard MRN: [id number] Date of Birth: 09-15-1940  Today's Date: 02/14/2017 PT Individual Time: 1100-1200 PT Individual Time Calculation (min): 60 min   Short Term Goals:  Skilled Therapeutic Interventions/Progress Updates: Pt presented in w/c agreeable to therapy. Propelled to rehab gym supervision with min cues for use of RUE to facilitate propulsion. ModA squat pivot to R onto mat. Performed sitting balance crossing midline and reaching for beanbags then placing on table . Pt demonstrating overall good sitting balance with activities and demonstrated no LOB. Blocked practice sit to/from stand x 5 mod to minA with PTA stabilizing R ankle and cues for forward hips to maintain midline.  Returned to w/c squat pivot modA and transported to NuStep. Performed NuStep x 5 min L2 for endurance, LE strengthening, and NMR for reciprocal activity. Attempted x 2 min with BUE with c/o increased R shoulder pain, removed feom hand splint and performed with LUE only. Pt returned to w/c in same manner as prior and propelled to room using hemi technique and additional time. Pt remained in w/c at end of session with call bell within reach and needs met.      Therapy Documentation Precautions:  Precautions Precautions: Fall Restrictions Weight Bearing Restrictions: No Other Position/Activity Restrictions: Dysarthria from prior stroke with prior R sided weakness General:   Vital Signs:   Pain: Pain Assessment Pain Score: 0-No pain   See Function Navigator for Current Functional Status.   Therapy/Group: Individual Therapy  Burt Piatek 02/14/2017, 1:00 PM

## 2017-02-14 NOTE — Progress Notes (Signed)
Subjective/Complaints:  Eating breakfast. No new complaints. Denies pain  ROS: Limited due to cognitive/behavioral   Objective: Vital Signs: Blood pressure 121/64, pulse 75, temperature 97.8 F (36.6 C), temperature source Oral, resp. rate 18, height 5\' 7"  (1.702 m), weight 69.9 kg (154 lb 1.4 oz), SpO2 94 %. No results found. No results found for this or any previous visit (from the past 72 hour(s)).   HEENT: Normocephalic, atraumatic Cardio: RRR without murmur. No JVD    Resp: CTA Bilaterally without wheezes or rales. Normal effort  GI: BS positive and ND Skin:   Intact. Warm and dry. Neuro:  Right facial droop , dysarthric Cranial Nerve Abnormalities Right central 7 Severe dysarthria Motor 3- to 3/5, right deltoid, biceps, triceps, grip with spastic tone. Still doesn't initiate use of LUE without cueing. 4+/5 right hip flexor, knee extensor, ankle dorsiflexor and LUE/LLE: 5/5 proximal to distal Musc/Skel:  No edema, no tenderness. Gen. no acute distress.  .   Assessment/Plan: 1. 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 Physiatrist is providing close team supervision and 24 hour management of active medical problems listed below. Physiatrist and rehab team continue to assess barriers to discharge/monitor patient progress toward functional and medical goals. FIM: Function - Bathing Position: Shower Body parts bathed by patient: Chest, Abdomen, Right upper leg, Left upper leg, Left lower leg, Right lower leg, Right arm, Left arm, Front perineal area, Buttocks Body parts bathed by helper: Back Assist Level: Supervision or verbal cues  Function- Upper Body Dressing/Undressing What is the patient wearing?: Pull over shirt/dress Pull over shirt/dress - Perfomed by patient: Thread/unthread right sleeve, Thread/unthread left sleeve, Put head through  opening, Pull shirt over trunk Pull over shirt/dress - Perfomed by helper: Thread/unthread left sleeve, Put head through opening Assist Level: Supervision or verbal cues Function - Lower Body Dressing/Undressing What is the patient wearing?: Pants, Non-skid slipper socks Position: Wheelchair/chair at sink Pants- Performed by patient: Thread/unthread right pants leg, Thread/unthread left pants leg, Pull pants up/down Pants- Performed by helper: Pull pants up/down, Fasten/unfasten pants Non-skid slipper socks- Performed by patient: Don/doff right sock, Don/doff left sock Assist for footwear: Supervision/touching assist Assist for lower body dressing: Touching or steadying assistance (Pt > 75%)  Function - Toileting Toileting steps completed by patient: Adjust clothing prior to toileting, Performs perineal hygiene Toileting steps completed by helper: Adjust clothing after toileting Toileting Assistive Devices: Grab bar or rail Assist level: Touching or steadying assistance (Pt.75%)  Function - Archivist transfer assistive device: Grab bar Assist level to toilet: Touching or steadying assistance (Pt > 75%) Assist level from toilet: Touching or steadying assistance (Pt > 75%)  Function - Chair/bed transfer Chair/bed transfer method: Squat pivot Chair/bed transfer assist level: Moderate assist (Pt 50 - 74%/lift or lower) Chair/bed transfer assistive device: Armrests Chair/bed transfer details: Manual facilitation for weight shifting, Manual facilitation for placement, Verbal cues for precautions/safety, Verbal cues for technique  Function - Locomotion: Wheelchair Will patient use wheelchair at discharge?: Yes Type: Manual Max wheelchair distance: 150' Assist Level: Supervision or verbal cues Assist Level: Supervision or verbal cues Wheel 150 feet activity did not occur: Safety/medical concerns (Poor planning limits efficiency) Assist Level: Supervision or verbal  cues Turns around,maneuvers to table,bed, and toilet,negotiates 3% grade,maneuvers on rugs and over doorsills: No Function - Locomotion: Ambulation Assistive device: No device (hand held) Max distance:  (5 ft) Assist level: 2 helpers Walk  10 feet activity did not occur: Safety/medical concerns (Dependence) Walk 50 feet with 2 turns activity did not occur: Safety/medical concerns Walk 150 feet activity did not occur: Safety/medical concerns Walk 10 feet on uneven surfaces activity did not occur: Safety/medical concerns  Function - Comprehension Comprehension: Auditory Comprehension assist level: Understands basic 50 - 74% of the time/ requires cueing 25 - 49% of the time  Function - Expression Expression: Verbal Expression assist level: Expresses basis less than 25% of the time/requires cueing >75% of the time.  Function - Social Interaction Social Interaction assist level: Interacts appropriately less than 25% of the time. May be withdrawn or combative.  Function - Problem Solving Problem solving assist level: Solves basic less than 25% of the time - needs direction nearly all the time or does not effectively solve problems and may need a restraint for safety  Function - Memory Memory assist level: Recognizes or recalls less than 25% of the time/requires cueing greater than 75% of the time Patient normally able to recall (first 3 days only): None of the above  Medical Problem List and Plan: 1.  Dysarthria and dysphagia as well as decline in mobility and ability to carry out ADL tasks. secondary to bilateral CVA.  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  DVT Prophylaxis/Anticoagulation: Pharmaceutical: Lovenox, monitor platelets, platelets were normal on 02/11/2017 3. Pain Management: N/A 4. Mood: LCSW to follow for evaluation and support.  5. Neuropsych: This patient is not fully capable of making decisions on his own  behalf. 6. Skin/Wound Care: routine pressure relief measures.  7. Fluids/Electrolytes/Nutrition: Monitor I/Os. Monitor for signs of dehydration.    D3 Nectar, Intake ~358ml fluid, 70-85% meals  Bmet normal on 08/15 8. HTN: Meds resumed. Monitor for hypotension  in setting of proximal BA stenosis.   Controlled 8/17 Vitals:   02/13/17 1300 02/14/17 0420  BP: 129/70 121/64  Pulse: 80 75  Resp: 18 18  Temp: 98.1 F (36.7 C) 97.8 F (36.6 C)  SpO2: 98% 94%   9. Hyperlipidemia: On Zocor.  10. Polysubstance abuse: Counsel on importance of cessation. 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 On ASA, Plavix and Zocor.   LOS (Days) 10 A FACE TO FACE EVALUATION WAS PERFORMED  SWARTZ,ZACHARY T 02/14/2017, 10:06 AM

## 2017-02-15 NOTE — Progress Notes (Signed)
Subjective/Complaints:  No new problems. Continues to have good appetite.  ROS: Limited due to cognitive/behavioral   Objective: Vital Signs: Blood pressure 137/76, pulse 83, temperature 97.8 F (36.6 C), temperature source Oral, resp. rate 18, height 5\' 7"  (1.702 m), weight 69.9 kg (154 lb 1.4 oz), SpO2 99 %. No results found. No results found for this or any previous visit (from the past 72 hour(s)).   HEENT: Normocephalic, atraumatic Cardio: RRR without murmur. No JVD     Resp: CTA Bilaterally without wheezes or rales. Normal effort  GI: BS positive and ND Skin:   Intact. Warm and dry. Neuro:  Right facial droop , dysarthric Cranial Nerve Abnormalities Right central 7 Severe dysarthria Motor 3- to 3/5, right deltoid, biceps, triceps, grip with spastic tone. Still doesn't initiate use of LUE without cueing. 4+/5 right hip flexor, knee extensor, ankle dorsiflexor and LUE/LLE: 5/5 proximal to distal Musc/Skel:  No edema, no tenderness. Gen. no acute distress.  .   Assessment/Plan: 1. 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 Physiatrist is providing close team supervision and 24 hour management of active medical problems listed below. Physiatrist and rehab team continue to assess barriers to discharge/monitor patient progress toward functional and medical goals. FIM: Function - Bathing Position: Shower Body parts bathed by patient: Chest, Abdomen, Right upper leg, Left upper leg, Left lower leg, Right lower leg, Right arm, Left arm, Front perineal area, Buttocks Body parts bathed by helper: Back Assist Level: Supervision or verbal cues  Function- Upper Body Dressing/Undressing What is the patient wearing?: Pull over shirt/dress Pull over shirt/dress - Perfomed by patient: Thread/unthread right sleeve, Thread/unthread left sleeve, Put head through  opening, Pull shirt over trunk Pull over shirt/dress - Perfomed by helper: Thread/unthread left sleeve, Put head through opening Assist Level: Supervision or verbal cues Function - Lower Body Dressing/Undressing What is the patient wearing?: Pants, Non-skid slipper socks Position: Wheelchair/chair at sink Pants- Performed by patient: Thread/unthread right pants leg, Thread/unthread left pants leg, Pull pants up/down Pants- Performed by helper: Pull pants up/down, Fasten/unfasten pants Non-skid slipper socks- Performed by patient: Don/doff right sock, Don/doff left sock Assist for footwear: Supervision/touching assist Assist for lower body dressing: Touching or steadying assistance (Pt > 75%)  Function - Toileting Toileting steps completed by patient: Adjust clothing prior to toileting, Performs perineal hygiene Toileting steps completed by helper: Adjust clothing after toileting Toileting Assistive Devices: Grab bar or rail Assist level: Touching or steadying assistance (Pt.75%)  Function - Archivist transfer assistive device: Grab bar Assist level to toilet: Touching or steadying assistance (Pt > 75%) Assist level from toilet: Touching or steadying assistance (Pt > 75%)  Function - Chair/bed transfer Chair/bed transfer method: Squat pivot Chair/bed transfer assist level: Moderate assist (Pt 50 - 74%/lift or lower) Chair/bed transfer assistive device: Armrests Chair/bed transfer details: Manual facilitation for weight shifting, Manual facilitation for placement, Verbal cues for precautions/safety, Verbal cues for technique  Function - Locomotion: Wheelchair Will patient use wheelchair at discharge?: Yes Type: Manual Max wheelchair distance: 150' Assist Level: Supervision or verbal cues Assist Level: Supervision or verbal cues Wheel 150 feet activity did not occur: Safety/medical concerns (Poor planning limits efficiency) Assist Level: Supervision or verbal  cues Turns around,maneuvers to table,bed, and toilet,negotiates 3% grade,maneuvers on rugs and over doorsills: No Function - Locomotion: Ambulation Assistive device: No device (hand held) Max distance:  (5 ft) Assist level: 2  helpers Walk 10 feet activity did not occur: Safety/medical concerns (Dependence) Walk 50 feet with 2 turns activity did not occur: Safety/medical concerns Walk 150 feet activity did not occur: Safety/medical concerns Walk 10 feet on uneven surfaces activity did not occur: Safety/medical concerns  Function - Comprehension Comprehension: Auditory Comprehension assist level: Understands basic 50 - 74% of the time/ requires cueing 25 - 49% of the time  Function - Expression Expression: Verbal Expression assist level: Expresses basis less than 25% of the time/requires cueing >75% of the time.  Function - Social Interaction Social Interaction assist level: Interacts appropriately less than 25% of the time. May be withdrawn or combative.  Function - Problem Solving Problem solving assist level: Solves basic less than 25% of the time - needs direction nearly all the time or does not effectively solve problems and may need a restraint for safety  Function - Memory Memory assist level: Recognizes or recalls less than 25% of the time/requires cueing greater than 75% of the time Patient normally able to recall (first 3 days only): None of the above  Medical Problem List and Plan: 1.  Dysarthria and dysphagia as well as decline in mobility and ability to carry out ADL tasks. secondary to bilateral CVA.  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  DVT Prophylaxis/Anticoagulation: Pharmaceutical: Lovenox, monitor platelets, platelets were normal on 02/11/2017 3. Pain Management: N/A 4. Mood: LCSW to follow for evaluation and support.  5. Neuropsych: This patient is not fully capable of making decisions on his own  behalf. 6. Skin/Wound Care: routine pressure relief measures.  7. Fluids/Electrolytes/Nutrition: Monitor I/Os. Monitor for signs of dehydration.    D3 Nectar, Intake ~329ml fluid, 70-85% meals  Bmet normal on 08/15 8. HTN: Meds resumed. Monitor for hypotension  in setting of proximal BA stenosis.   Controlled 8/19 Vitals:   02/14/17 1421 02/15/17 0630  BP: (!) 112/52 137/76  Pulse: 75 83  Resp: 18 18  Temp: 98.1 F (36.7 C) 97.8 F (36.6 C)  SpO2: 97% 99%   9. Hyperlipidemia: On Zocor.  10. Polysubstance abuse: Counsel on importance of cessation. 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 On ASA, Plavix and Zocor.   LOS (Days) 11 A FACE TO FACE EVALUATION WAS PERFORMED  Nea Gittens T 02/15/2017, 10:37 AM

## 2017-02-16 ENCOUNTER — Inpatient Hospital Stay (HOSPITAL_COMMUNITY): Payer: Medicare HMO | Admitting: Speech Pathology

## 2017-02-16 ENCOUNTER — Inpatient Hospital Stay (HOSPITAL_COMMUNITY): Payer: Medicare HMO | Admitting: Occupational Therapy

## 2017-02-16 ENCOUNTER — Inpatient Hospital Stay (HOSPITAL_COMMUNITY): Payer: Medicare HMO | Admitting: Physical Therapy

## 2017-02-16 NOTE — Progress Notes (Signed)
Physical Therapy Session Note  Patient Details  Name: Brandon Howard MRN: [id number] Date of Birth: 28-May-1941  Today's Date: 02/16/2017 PT Individual Time: 0810-0905 and 1300-1330 PT Individual Time Calculation (min): 55 min and 30 min  Short Term Goals: Week 2:  PT Short Term Goal 1 (Week 2): Pt will perform bed<>chair transfer w/ Min A via stand pivot transfer PT Short Term Goal 2 (Week 2): Pt will ambulate 5' w/ hemiwalker Max A  PT Short Term Goal 3 (Week 2): Pt will demonstrate dynamic sitting balance w/o UE support  Skilled Therapeutic Interventions/Progress Updates: Tx1: Pt presented in bed completing breakfast. PTA donned pants total assist. Performed supine to sit with HOB elevated modA, maxA for scooting to EOB. Sit to stand modA with cues for RLE foot placement and R knee extension to allow PTA to button pants. PTA donned shoes total assist and performed squat pivot to R to w/c modA. Pt propelled self to rehab gym with LLE only and supervision. Performed sit to stand in parallel bars minA and performed gait in parallel bars maxA (x4 ft) with total assist for RLE foot placement, R knee extension, and wt shift to R. Returned to w/c, performed squat pivot to mat. Performed scooting on mat R/L x 4 st ea. Multimodal cues for head/hips relationship when scooting particularly to R. Sit to stand x 3 from mat with max multimodal cues for R wt shift and R foot placement. Performed stand pivot to w/c with use of HW and maxA. Returned to room with chair alarm on, QRB placed and call bell within reach.   Tx2: Pt presented in w/c agreeable to therapy. Session focused on functional transfers. Performed blocked squat pivot transfer w/c mat x 6. Cues for sequencing, hand placement, and wt shifting. Pt initially min/mod A to R, modA to L. Pt progressed to min guard consistently L/R. Performed sit to stand x 4 from mat min/modA with visual feedback cues for R wt shift with PTA blocking R knee. Pt  returned to room and remained in w/c with chair alarm and call bell within reach.      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   Therapy/Group: Individual Therapy  Udell Blasingame  Brighten Orndoff, PTA  02/16/2017, 9:15 AM

## 2017-02-16 NOTE — Progress Notes (Signed)
Patient slept quietly overnight without issue.   Brandon Howard M

## 2017-02-16 NOTE — Progress Notes (Signed)
Orthopedic Tech Progress Note Patient Details:  Brandon Howard [birth year] [id number]  Ortho Devices Type of Ortho Device: Ankle Air splint Ortho Device/Splint Interventions: Application   Saul Fordyce 02/16/2017, 11:18 AM

## 2017-02-16 NOTE — Progress Notes (Signed)
Speech Language Pathology Daily Session Note  Patient Details  Name: Brandon Howard MRN: [id number] Date of Birth: 11/14/1940  Today's Date: 02/16/2017 SLP Individual Time: 1415-1500 SLP Individual Time Calculation (min): 45 min  Short Term Goals: Week 2: SLP Short Term Goal 1 (Week 2): Pt will communicate basic wants and needs via multimodal communication with Max A cues.  SLP Short Term Goal 2 (Week 2): Pt will utilize speech intelligibility strategies to achieve ~ 50% intelligibility at the word level with Max A cues.  SLP Short Term Goal 3 (Week 2): Pt will answer yes/no questions using multimodalities with ~ 50% accuracy and Min A cues.  SLP Short Term Goal 4 (Week 2): Pt will demonstrate minimal s/s of aspiration with trials of ice chips to demonstrate readiness for repeat instrumental swallow study.   Skilled Therapeutic Interventions: Skilled treatment session focused on functional communication. Upon arrival, patient had significant coughing, suspect due to poor management of secretions. SLP offered administering the water prootcol, however, patient declined and required intermittent Min verbal cues to swallow saliva throughout session. Patient named functional items with ~90% accuracy and verbalized differences and similarities between the two items at the phrase level with 75% accuracy. Patient was ~75% intelligible at the word level and ~25% intelligible at the phrase level despite Max A multimodal cues for use of speech intelligibility strategies. Patient left upright in wheelchair with alarm on and all needs within reach. Continue with current plan of care.      Function:   Cognition Comprehension Comprehension assist level: Understands basic 50 - 74% of the time/ requires cueing 25 - 49% of the time  Expression   Expression assist level: Expresses basis less than 25% of the time/requires cueing >75% of the time.  Social Interaction Social Interaction assist level: Interacts  appropriately less than 25% of the time. May be withdrawn or combative.  Problem Solving Problem solving assist level: Solves basic 25 - 49% of the time - needs direction more than half the time to initiate, plan or complete simple activities  Memory Memory assist level: Recognizes or recalls less than 25% of the time/requires cueing greater than 75% of the time    Pain No/Denies Pain   Therapy/Group: Individual Therapy  Kerilyn Cortner 02/16/2017, 3:10 PM

## 2017-02-16 NOTE — Progress Notes (Signed)
Occupational Therapy Session Note  Patient Details  Name: Brandon Howard MRN: [id number] Date of Birth: 02-Jan-1941  Today's Date: 02/16/2017 OT Individual Time: 1100-1200 OT Individual Time Calculation (min): 60 min    Short Term Goals: Week 2:  OT Short Term Goal 1 (Week 2): Pt will complete 2/3 toileting tasks with steadying assist OT Short Term Goal 2 (Week 2): Pt will complete 1 grooming task in standing with min A for steadying in order to increase functional standing balance and endurance OT Short Term Goal 3 (Week 2): Pt will complete stand pivot transfer with hemi walker and mod A in prep for functional tasks.  OT Short Term Goal 4 (Week 2): Pt will don shirt with set-up assist  Skilled Therapeutic Interventions/Progress Updates:    Pt seen for OT session focusing on functional standing balance/ endurance and functional transfers. Pt sitting up in w/c upon arrival, agreeable to tx session and denying bathing/dressing this session. He self propelled w/c using hemi technique with distant supervision and min cuing for attention to R barriers.  Pt had received Aircast since PT session and desiring to practice standing. Completed standing trials at hemi walker with min A, able to maintain static balance with CGA progressing to close supervision. Pt's knee still rotated out, however, with use of air cast ankle maintained stability. Completed x4 trials with pt tolerating ~1 minute of standing before requesting seated rest break.  Returned to room and completed stand pivot w/c <> toilet with use of grab bars. Mod A steadying as pt attempting to coordinated steps back to toilet. Pt able to complete clothing management with steadying assist and min A to advance over R hip. Education and demonstration provided for squat pivot technique. Completed x3 w/c <> toilet squat pivots with overall min A. Pt returned to sink to complete hand hygiene with min A and oral hygiene with use of suction. Pt  declining water protocol this A.M.  Pt left seated in w/c at end of session, all needs in reach.   Trials a  Therapy Documentation Precautions:  Precautions Precautions: Fall Restrictions Weight Bearing Restrictions: No Other Position/Activity Restrictions: Dysarthria from prior stroke with prior R sided weakness Pain:   No/ denies pain  See Function Navigator for Current Functional Status.   Therapy/Group: Individual Therapy  [name], Sarayu Prevost C 02/16/2017, 7:17 AM

## 2017-02-16 NOTE — Progress Notes (Signed)
Subjective/Complaints:  Brandon Howard his way to PT, no c/o but remains severely dysarthric  ROS: Limited due to cognitive/behavioral   Objective: Vital Signs: Blood pressure 118/80, pulse 77, temperature 98.1 F (36.7 C), temperature source Oral, resp. rate 16, height 5\' 7"  (1.702 m), weight 69.9 kg (154 lb 1.4 oz), SpO2 99 %. No results found. No results found for this or any previous visit (from the past 72 hour(s)).   HEENT: Normocephalic, atraumatic Cardio: RRR without murmur. No JVD     Resp: CTA Bilaterally without wheezes or rales. Normal effort  GI: BS positive and ND Skin:   Intact. Warm and dry. Neuro:  Right facial droop , dysarthric Cranial Nerve Abnormalities Right central 7 Severe dysarthria Motor 3- to 3/5, right deltoid, biceps, triceps, grip with spastic tone. Still doesn't initiate use of LUE without cueing. 4+/5 right hip flexor, knee extensor, ankle dorsiflexor and LUE/LLE: 5/5 proximal to distal Musc/Skel:  No edema, no tenderness. Gen. no acute distress.  .   Assessment/Plan: 1. 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 Physiatrist is providing close team supervision and 24 hour management of active medical problems listed below. Physiatrist and rehab team continue to assess barriers to discharge/monitor patient progress toward functional and medical goals. FIM: Function - Bathing Position: Shower Body parts bathed by patient: Chest, Abdomen, Right upper leg, Left upper leg, Left lower leg, Right lower leg, Right arm, Left arm, Front perineal area, Buttocks Body parts bathed by helper: Back Assist Level: Supervision or verbal cues  Function- Upper Body Dressing/Undressing What is the patient wearing?: Pull over shirt/dress Pull over shirt/dress - Perfomed by patient: Thread/unthread right sleeve, Thread/unthread left sleeve, Put  head through opening, Pull shirt over trunk Pull over shirt/dress - Perfomed by helper: Thread/unthread left sleeve, Put head through opening Assist Level: Supervision or verbal cues Function - Lower Body Dressing/Undressing What is the patient wearing?: Pants, Non-skid slipper socks Position: Wheelchair/chair at sink Pants- Performed by patient: Thread/unthread right pants leg, Thread/unthread left pants leg, Pull pants up/down Pants- Performed by helper: Pull pants up/down, Fasten/unfasten pants Non-skid slipper socks- Performed by patient: Don/doff right sock, Don/doff left sock Assist for footwear: Supervision/touching assist Assist for lower body dressing: Touching or steadying assistance (Pt > 75%)  Function - Toileting Toileting steps completed by patient: Adjust clothing prior to toileting, Performs perineal hygiene Toileting steps completed by helper: Adjust clothing after toileting Toileting Assistive Devices: Grab bar or rail Assist level: Touching or steadying assistance (Pt.75%)  Function - Archivist transfer assistive device: Grab bar Assist level to toilet: Maximal assist (Pt 25 - 49%/lift and lower) Assist level from toilet: Maximal assist (Pt 25 - 49%/lift and lower)  Function - Chair/bed transfer Chair/bed transfer method: Squat pivot Chair/bed transfer assist level: Moderate assist (Pt 50 - 74%/lift or lower) Chair/bed transfer assistive device: Armrests Chair/bed transfer details: Manual facilitation for weight shifting, Manual facilitation for placement, Verbal cues for precautions/safety, Verbal cues for technique  Function - Locomotion: Wheelchair Will patient use wheelchair at discharge?: Yes Type: Manual Max wheelchair distance: 150' Assist Level: Supervision or verbal cues Assist Level: Supervision or verbal cues Wheel 150 feet activity did not occur: Safety/medical concerns (Poor planning limits efficiency) Assist Level: Supervision or  verbal cues Turns around,maneuvers to table,bed, and toilet,negotiates 3% grade,maneuvers on rugs and over doorsills: No Function - Locomotion: Ambulation Assistive device: No device (hand held) Max distance:  (  5 ft) Assist level: 2 helpers Walk 10 feet activity did not occur: Safety/medical concerns (Dependence) Walk 50 feet with 2 turns activity did not occur: Safety/medical concerns Walk 150 feet activity did not occur: Safety/medical concerns Walk 10 feet on uneven surfaces activity did not occur: Safety/medical concerns  Function - Comprehension Comprehension: Auditory Comprehension assist level: Understands basic 50 - 74% of the time/ requires cueing 25 - 49% of the time  Function - Expression Expression: Verbal Expression assist level: Expresses basis less than 25% of the time/requires cueing >75% of the time.  Function - Social Interaction Social Interaction assist level: Interacts appropriately less than 25% of the time. May be withdrawn or combative.  Function - Problem Solving Problem solving assist level: Solves basic less than 25% of the time - needs direction nearly all the time or does not effectively solve problems and may need a restraint for safety  Function - Memory Memory assist level: Recognizes or recalls less than 25% of the time/requires cueing greater than 75% of the time Patient normally able to recall (first 3 days only): None of the above  Medical Problem List and Plan: 1.  Dysarthria and dysphagia as well as decline in mobility and ability to carry out ADL tasks. secondary to bilateral CVA.  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  DVT Prophylaxis/Anticoagulation: Pharmaceutical: Lovenox, monitor platelets, platelets were normal on 02/11/2017 3. Pain Management: N/A 4. Mood: LCSW to follow for evaluation and support.  5. Neuropsych: This patient is not fully capable of making decisions on his  own behalf. 6. Skin/Wound Care: routine pressure relief measures.  7. Fluids/Electrolytes/Nutrition: Monitor I/Os. Monitor for signs of dehydration.    D3 Nectar, Intake ~359ml fluid, 70-85% meals  Bmet normal on 08/15 8. HTN: Meds resumed. Monitor for hypotension  in setting of proximal BA stenosis.   Controlled 8/20 Vitals:   02/15/17 1509 02/16/17 0450  BP: 108/68 118/80  Pulse: 78 77  Resp: 18 16  Temp: 98.2 F (36.8 C) 98.1 F (36.7 C)  SpO2: 99% 99%   9. Hyperlipidemia: On Zocor.  10. Polysubstance abuse: Counsel on importance of cessation. 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 On ASA, Plavix and Zocor.   LOS (Days) 12 A FACE TO FACE EVALUATION WAS PERFORMED  Takeela Peil E 02/16/2017, 8:34 AM

## 2017-02-17 ENCOUNTER — Inpatient Hospital Stay (HOSPITAL_COMMUNITY): Payer: Medicare HMO | Admitting: Physical Therapy

## 2017-02-17 ENCOUNTER — Inpatient Hospital Stay (HOSPITAL_COMMUNITY): Payer: Medicare HMO

## 2017-02-17 ENCOUNTER — Inpatient Hospital Stay (HOSPITAL_COMMUNITY): Payer: Medicare HMO | Admitting: Occupational Therapy

## 2017-02-17 NOTE — Progress Notes (Signed)
Occupational Therapy Session Note  Patient Details  Name: Brandon Howard MRN: [id number] Date of Birth: 08/02/1940  Today's Date: 02/17/2017 OT Individual Time: 2518-9842 and 1400-1430 OT Individual Time Calculation (min): 60 min and 30 min   Short Term Goals: Week 2:  OT Short Term Goal 1 (Week 2): Pt will complete 2/3 toileting tasks with steadying assist OT Short Term Goal 2 (Week 2): Pt will complete 1 grooming task in standing with min A for steadying in order to increase functional standing balance and endurance OT Short Term Goal 3 (Week 2): Pt will complete stand pivot transfer with hemi walker and mod A in prep for functional tasks.  OT Short Term Goal 4 (Week 2): Pt will don shirt with set-up assist  Skilled Therapeutic Interventions/Progress Updates:    Session one: Pt seen for OT session focusing on ADL re-training. Pt slouched in bed upon arrival with NT prsent supervising breakfast, hand off to OT. PT agreeable to session. He transferred to EOB with min A using hospital bed functions. Ate remainder of breakfast sitting upright EOB, Required mod cuing throughout meal for smaller bites and clearing of oral residue. Pt becoming very frustrated with therapist for cuing, provided education regarding reason for cuing and importance of safe swallowing.  Following breakfast, pt bathed at shower level. COmpleted squat pivot transfers throughout session with mod A when transfering to pt's weaker R side. He bathed on 3-1 BSC with VCs required for sequencing as pt attemptiong to bathe with dry wash cloth. He returned to w/c to dress, independetly recalling hemi dressing technique and donning shirt with set-up assist. He stood at sink with steadying assist to pull pants up. Socks donned with set-up assist and air-cast donned total A Pt left seated in w/c at end of session, all needs in reach.   Session Two: Pt seen for neuro re-ed with use of NMST. Pt greeted in w/c, agreeable to tx  session. Self propelled w/c throughout unit using hemi technique with suprevision, min cuing for environmental obstacles on R. Completed E-stim on wrist extensors, movement noted in gravity eliminated position. Pt tolerated well and educated regarding use and purpose of E-stim. He returned to room at end of session, left seated in w/c with all needs in reach.   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   Therapy/Group: Individual Therapy  [name], Irvine Glorioso C 02/17/2017, 7:02 AM

## 2017-02-17 NOTE — Progress Notes (Signed)
Physical Therapy Session Note  Patient Details  Name: Brandon Howard MRN: [id number] Date of Birth: April 19, 1941  Today's Date: 02/17/2017 PT Individual Time: 0930-1030 PT Individual Time Calculation (min): 60 min   Short Term Goals: Week 2:  PT Short Term Goal 1 (Week 2): Pt will perform bed<>chair transfer w/ Min A via stand pivot transfer PT Short Term Goal 2 (Week 2): Pt will ambulate 5' w/ hemiwalker Max A  PT Short Term Goal 3 (Week 2): Pt will demonstrate dynamic sitting balance w/o UE support  Skilled Therapeutic Interventions/Progress Updates: Pt presented in w/c agreeable to therapy. Pt donned shoes with aircast on RLE minA. Pt propelled to rehab gym supervision. Performed blocked sit to/from stand at mat x 10 with cues for sequencing and to decreased LLE pushing against mat. Attempted stand pivot x 2 w/c to/from mat requiring mod/maxA for sequencing, HW placement, and RLE placement. Pt with decreased awarene and sequencing despite max multimodal cues from PTA. Performed Cybex Kinetron 80cm/sec 3 bouts 2 min ea with resistance band around knees to decrease R hip ER. Pt performed with AAROM on RLE to increase range. Pt returned to room and remained in w/c with QRB in place and current needs met.      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   Therapy/Group: Individual Therapy  Letecia Arps  Catriona Dillenbeck, PTA  02/17/2017, 4:24 PM

## 2017-02-17 NOTE — Progress Notes (Signed)
Speech Language Pathology Daily Session Note  Patient Details  Name: Brandon Howard MRN: [id number] Date of Birth: [birth year]  Today's Date: 02/17/2017 SLP Individual Time:  -     Short Term Goals: Week 2: SLP Short Term Goal 1 (Week 2): Pt will communicate basic wants and needs via multimodal communication with Max A cues.  SLP Short Term Goal 2 (Week 2): Pt will utilize speech intelligibility strategies to achieve ~ 50% intelligibility at the word level with Max A cues.  SLP Short Term Goal 3 (Week 2): Pt will answer yes/no questions using multimodalities with ~ 50% accuracy and Min A cues.  SLP Short Term Goal 4 (Week 2): Pt will demonstrate minimal s/s of aspiration with trials of ice chips to demonstrate readiness for repeat instrumental swallow study.   Skilled Therapeutic Interventions: Skilled ST treatment focused on speech intelligibility, language and dysphagia goals. SLP facilitated PO trials of thin liquids and recorded on water protocol resulted, pt demonstrated PO consumption of TSP x 10 post swallow 1 cough and 5 cup sips post swallow 2 coughs. PT demonstrated swishing of liquids in mouth, SLP instructed pt to swallow small amount of thin at one time, however unable to demonstrate given max visual and verbal cues. SLP facilitated language and speech intelligibility skills but utilizing functional picture cards, pt demonstrated ability to identify items and function with 80% accuracy, respond to yes/no questions pertaining to cards with Mod I and produce phrase about picture with 80% accuracy given 70% intelligibility. Pt was left in room in chair with call bell within reach. Recommend to continue ST services.     Function:  Eating Eating   Modified Consistency Diet: No Eating Assist Level: Set up assist for;Supervision or verbal cues           Cognition Comprehension Comprehension assist level: Understands basic 50 - 74% of the time/ requires cueing 25 - 49% of the  time  Expression   Expression assist level: Expresses basic 25 - 49% of the time/requires cueing 50 - 75% of the time. Uses single words/gestures.  Social Interaction Social Interaction assist level: Interacts appropriately less than 25% of the time. May be withdrawn or combative.  Problem Solving Problem solving assist level: Solves basic 25 - 49% of the time - needs direction more than half the time to initiate, plan or complete simple activities  Memory Memory assist level: Recognizes or recalls less than 25% of the time/requires cueing greater than 75% of the time    Pain Pain Assessment Pain Assessment: No/denies pain  Therapy/Group: Individual Therapy  Shirla Hodgkiss  [facility] 02/17/2017, 3:38 PM

## 2017-02-17 NOTE — Progress Notes (Signed)
Subjective/Complaints:  In room this a.m., remains severely dysarthric  ROS: Limited due to cognitive/behavioral   Objective: Vital Signs: Blood pressure 134/78, pulse 82, temperature 98.4 F (36.9 C), temperature source Oral, resp. rate 18, height 5\' 7"  (1.702 m), weight 69.9 kg (154 lb 1.4 oz), SpO2 96 %. No results found. No results found for this or any previous visit (from the past 72 hour(s)).   HEENT: Normocephalic, atraumatic Cardio: RRR without murmur. No JVD     Resp: CTA Bilaterally without wheezes or rales. Normal effort  GI: BS positive and ND Skin:   Intact. Warm and dry. Neuro:  Right facial droop , dysarthric Cranial Nerve Abnormalities Right central 7 Severe dysarthria Motor 3- to 3/5, right deltoid, biceps, triceps, grip with spastic tone. Still doesn't initiate use of LUE without cueing. 4+/5 right hip flexor, knee extensor, ankle dorsiflexor and LUE/LLE: 5/5 proximal to distal Musc/Skel:  No edema, no tenderness. Gen. no acute distress.  .   Assessment/Plan: 1. 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 Physiatrist is providing close team supervision and 24 hour management of active medical problems listed below. Physiatrist and rehab team continue to assess barriers to discharge/monitor patient progress toward functional and medical goals. FIM: Function - Bathing Position: Shower Body parts bathed by patient: Chest, Abdomen, Right upper leg, Left upper leg, Left lower leg, Right lower leg, Right arm, Left arm, Front perineal area, Buttocks Body parts bathed by helper: Back Assist Level: Supervision or verbal cues  Function- Upper Body Dressing/Undressing What is the patient wearing?: Pull over shirt/dress Pull over shirt/dress - Perfomed by patient: Thread/unthread right sleeve, Thread/unthread left sleeve, Put head through  opening, Pull shirt over trunk Pull over shirt/dress - Perfomed by helper: Thread/unthread left sleeve, Put head through opening Assist Level: Set up Function - Lower Body Dressing/Undressing What is the patient wearing?: Pants, Non-skid slipper socks, AFO Position: Wheelchair/chair at sink Pants- Performed by patient: Thread/unthread right pants leg, Thread/unthread left pants leg, Pull pants up/down Pants- Performed by helper: Pull pants up/down, Fasten/unfasten pants Non-skid slipper socks- Performed by patient: Don/doff right sock, Don/doff left sock AFO - Performed by helper: Don/doff right AFO Assist for footwear: Supervision/touching assist Assist for lower body dressing: Touching or steadying assistance (Pt > 75%)  Function - Toileting Toileting steps completed by patient: Adjust clothing prior to toileting, Performs perineal hygiene Toileting steps completed by helper: Adjust clothing after toileting Toileting Assistive Devices: Grab bar or rail Assist level: Touching or steadying assistance (Pt.75%)  Function - Archivist transfer assistive device: Grab bar Assist level to toilet: Moderate assist (Pt 50 - 74%/lift or lower) Assist level from toilet: Moderate assist (Pt 50 - 74%/lift or lower)  Function - Chair/bed transfer Chair/bed transfer method: Squat pivot Chair/bed transfer assist level: Moderate assist (Pt 50 - 74%/lift or lower) Chair/bed transfer assistive device: Armrests Chair/bed transfer details: Manual facilitation for weight shifting, Manual facilitation for placement, Verbal cues for precautions/safety, Verbal cues for technique  Function - Locomotion: Wheelchair Will patient use wheelchair at discharge?: Yes Type: Manual Max wheelchair distance: 150' Assist Level: Supervision or verbal cues Assist Level: Supervision or verbal cues Wheel 150 feet activity did not occur: Safety/medical concerns (Poor planning limits efficiency) Assist Level:  Supervision or verbal cues Turns around,maneuvers to table,bed, and toilet,negotiates 3% grade,maneuvers on rugs and over doorsills: No Function - Locomotion: Ambulation Assistive device: Other (comment) (Parallel bars)  Max distance: 63ft Assist level: Maximal assist (Pt 25 - 49%) Walk 10 feet activity did not occur: Safety/medical concerns (Dependence) Walk 50 feet with 2 turns activity did not occur: Safety/medical concerns Walk 150 feet activity did not occur: Safety/medical concerns Walk 10 feet on uneven surfaces activity did not occur: Safety/medical concerns  Function - Comprehension Comprehension: Auditory Comprehension assist level: Understands basic 50 - 74% of the time/ requires cueing 25 - 49% of the time  Function - Expression Expression: Verbal Expression assist level: Expresses basis less than 25% of the time/requires cueing >75% of the time.  Function - Social Interaction Social Interaction assist level: Interacts appropriately less than 25% of the time. May be withdrawn or combative.  Function - Problem Solving Problem solving assist level: Solves basic 25 - 49% of the time - needs direction more than half the time to initiate, plan or complete simple activities  Function - Memory Memory assist level: Recognizes or recalls less than 25% of the time/requires cueing greater than 75% of the time Patient normally able to recall (first 3 days only): None of the above  Medical Problem List and Plan: 1.  Dysarthria and dysphagia as well as decline in mobility and ability to carry out ADL tasks. secondary to bilateral CVA.  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  DVT Prophylaxis/Anticoagulation: Pharmaceutical: Lovenox, monitor platelets, platelets were normal on 02/11/2017 3. Pain Management: N/A 4. Mood: LCSW to follow for evaluation and support.  5. Neuropsych: This patient is not fully capable of making  decisions on his own behalf. 6. Skin/Wound Care: routine pressure relief measures.  7. Fluids/Electrolytes/Nutrition: Monitor I/Os. Monitor for signs of dehydration.    D3 Nectar, Intake ~385ml fluid, 70-85% meals  Bmet normal on 08/15 8. HTN: Meds resumed. Monitor for hypotension  in setting of proximal BA stenosis.   Controlled 8/20 Vitals:   02/16/17 1528 02/17/17 0458  BP: (!) 109/55 134/78  Pulse: 79 82  Resp: 18 18  Temp: 98.6 F (37 C) 98.4 F (36.9 C)  SpO2: 98% 96%   9. Hyperlipidemia: On Zocor.  10. Polysubstance abuse: Counsel on importance of cessation. 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 On ASA, Plavix and Zocor.   LOS (Days) 13 A FACE TO FACE EVALUATION WAS PERFORMED  Elantra Caprara E 02/17/2017, 9:18 AM

## 2017-02-18 ENCOUNTER — Inpatient Hospital Stay (HOSPITAL_COMMUNITY): Payer: Medicare HMO | Admitting: Speech Pathology

## 2017-02-18 ENCOUNTER — Inpatient Hospital Stay (HOSPITAL_COMMUNITY): Payer: Medicare HMO | Admitting: Physical Therapy

## 2017-02-18 ENCOUNTER — Inpatient Hospital Stay (HOSPITAL_COMMUNITY): Payer: Medicare HMO | Admitting: Occupational Therapy

## 2017-02-18 LAB — BASIC METABOLIC PANEL
Anion gap: 7 (ref 5–15)
BUN: 12 mg/dL (ref 6–20)
CHLORIDE: 105 mmol/L (ref 101–111)
CO2: 29 mmol/L (ref 22–32)
Calcium: 9 mg/dL (ref 8.9–10.3)
Creatinine, Ser: 0.79 mg/dL (ref 0.61–1.24)
GFR calc Af Amer: >60 mL/min (ref >60)
GFR calc non Af Amer: >60 mL/min (ref >60)
GLUCOSE: 97 mg/dL (ref 65–99)
POTASSIUM: 3.9 mmol/L (ref 3.5–5.1)
Sodium: 141 mmol/L (ref 135–145)

## 2017-02-18 LAB — CBC
HEMATOCRIT: 45.7 % (ref 39.0–52.0)
Hemoglobin: 15.3 g/dL (ref 13.0–17.0)
MCH: 30.6 pg (ref 26.0–34.0)
MCHC: 33.5 g/dL (ref 30.0–36.0)
MCV: 91.4 fL (ref 78.0–100.0)
Platelets: 315 10*3/uL (ref 150–400)
RBC: 5 MIL/uL (ref 4.22–5.81)
RDW: 13.2 % (ref 11.5–15.5)
WBC: 7.5 10*3/uL (ref 4.0–10.5)

## 2017-02-18 NOTE — Progress Notes (Signed)
Physical Therapy Session Note  Patient Details  Name: Brandon Howard MRN: [id number] Date of Birth: 08-May-1941  Today's Date: 02/18/2017 PT Individual Time: 1100-1200 PT Individual Time Calculation (min): 60 min   Short Term Goals: Week 2:  PT Short Term Goal 1 (Week 2): Pt will perform bed<>chair transfer w/ Min A via stand pivot transfer PT Short Term Goal 2 (Week 2): Pt will ambulate 5' w/ hemiwalker Max A  PT Short Term Goal 3 (Week 2): Pt will demonstrate dynamic sitting balance w/o UE support  Skilled Therapeutic Interventions/Progress Updates:   Pt received sitting in WC and agreeable to PT  Pt transported to Rehab gym in [facility] with total assist for time management.   Gait training with various AFO's to improve ankle and knee stability. 4 x 20 ft with HW and mod assist from PT. PT required to facilitate advancement of the RLE to to prevent hip adduction. Pt noted to have improved limb advancement and ankle stability with Medial support. Would benefit from t-strap in future and explained to pt.   Nustep x8 minutes with 2 minute rest break at 5 minute mark. Level 2>3 with RLE hip stabilizer. Min cues from PT for improved ROM.   WC mobility x 179f using L hemi technique and supervision assist from PT. Min cues for doorway management and navigation in tight space of room.    [facility] and all needs met.        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   Therapy/Group: Individual Therapy  ALorie Phenix8/22/2018, 12:58 PM

## 2017-02-18 NOTE — Progress Notes (Signed)
Subjective/Complaints:  Severe dysarthria  ROS: Limited due to cognitive/behavioral   Objective: Vital Signs: Blood pressure (!) 144/76, pulse 76, temperature 97.7 F (36.5 C), temperature source Oral, resp. rate 18, height '5\' 7"'$  (1.702 m), weight 71.6 kg (157 lb 12.8 oz), SpO2 94 %. No results found. Results for orders placed or performed during the hospital encounter of 02/04/17 (from the past 72 hour(s))  Basic metabolic panel     Status: None   Collection Time: 02/18/17  4:07 AM  Result Value Ref Range   Sodium 141 135 - 145 mmol/L   Potassium 3.9 3.5 - 5.1 mmol/L   Chloride 105 101 - 111 mmol/L   CO2 29 22 - 32 mmol/L   Glucose, Bld 97 65 - 99 mg/dL   BUN 12 6 - 20 mg/dL   Creatinine, Ser 0.79 0.61 - 1.24 mg/dL   Calcium 9.0 8.9 - 10.3 mg/dL   GFR calc non Af Amer >60 >60 mL/min   GFR calc Af Amer >60 >60 mL/min    Comment: (NOTE) The eGFR has been calculated using the CKD EPI equation. This calculation has not been validated in all clinical situations. eGFR's persistently <60 mL/min signify possible Chronic Kidney Disease.    Anion gap 7 5 - 15  CBC     Status: None   Collection Time: 02/18/17  4:07 AM  Result Value Ref Range   WBC 7.5 4.0 - 10.5 K/uL   RBC 5.00 4.22 - 5.81 MIL/uL   Hemoglobin 15.3 13.0 - 17.0 g/dL   HCT 45.7 39.0 - 52.0 %   MCV 91.4 78.0 - 100.0 fL   MCH 30.6 26.0 - 34.0 pg   MCHC 33.5 30.0 - 36.0 g/dL   RDW 13.2 11.5 - 15.5 %   Platelets 315 150 - 400 K/uL     HEENT: Normocephalic, atraumatic Cardio: RRR without murmur. No JVD     Resp: CTA Bilaterally without wheezes or rales. Normal effort  GI: BS positive and ND Skin:   Intact. Warm and dry. Neuro:  Right facial droop , dysarthric Cranial Nerve Abnormalities Right central 7 Severe dysarthria Motor 3- to 3/5, right deltoid, biceps, triceps, grip with spastic tone. Still doesn't initiate use of LUE without cueing. 4+/5 right hip flexor, knee extensor, ankle dorsiflexor and LUE/LLE:  5/5 proximal to distal Musc/Skel:  No edema, no tenderness. Gen. no acute distress.  .   Assessment/Plan: 1. 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 Physiatrist is providing close team supervision and 24 hour management of active medical problems listed below. Physiatrist and rehab team continue to assess barriers to discharge/monitor patient progress toward functional and medical goals. FIM: Function - Bathing Position: Wheelchair/chair at sink Body parts bathed by patient: Chest, Abdomen, Right upper leg, Left upper leg, Left lower leg, Right lower leg, Right arm, Left arm, Front perineal area, Buttocks Body parts bathed by helper: Back Assist Level: Touching or steadying assistance(Pt > 75%)  Function- Upper Body Dressing/Undressing What is the patient wearing?: Pull over shirt/dress Pull over shirt/dress - Perfomed by patient: Thread/unthread right sleeve, Thread/unthread left sleeve, Put head through opening, Pull shirt over trunk Pull over shirt/dress - Perfomed by helper: Thread/unthread left sleeve, Put head through opening Assist Level: Touching or steadying assistance(Pt > 75%) Function - Lower Body Dressing/Undressing What is the patient wearing?: Pants, Non-skid slipper socks, AFO Position: Wheelchair/chair at sink Pants- Performed by patient:  Thread/unthread right pants leg, Thread/unthread left pants leg, Pull pants up/down Pants- Performed by helper: Pull pants up/down, Fasten/unfasten pants Non-skid slipper socks- Performed by patient: Don/doff right sock, Don/doff left sock AFO - Performed by helper: Don/doff right AFO Assist for footwear: Supervision/touching assist Assist for lower body dressing: Touching or steadying assistance (Pt > 75%)  Function - Toileting Toileting steps completed by patient: Adjust clothing prior to toileting,  Performs perineal hygiene Toileting steps completed by helper: Adjust clothing after toileting Toileting Assistive Devices: Grab bar or rail Assist level: Touching or steadying assistance (Pt.75%)  Function - Air cabin crew transfer assistive device: Grab bar Assist level to toilet: Moderate assist (Pt 50 - 74%/lift or lower) Assist level from toilet: Moderate assist (Pt 50 - 74%/lift or lower)  Function - Chair/bed transfer Chair/bed transfer method: Squat pivot Chair/bed transfer assist level: Moderate assist (Pt 50 - 74%/lift or lower) Chair/bed transfer assistive device: Armrests Chair/bed transfer details: Manual facilitation for weight shifting, Manual facilitation for placement, Verbal cues for precautions/safety, Verbal cues for technique  Function - Locomotion: Wheelchair Will patient use wheelchair at discharge?: Yes Type: Manual Max wheelchair distance: 150' Assist Level: Supervision or verbal cues Assist Level: Supervision or verbal cues Wheel 150 feet activity did not occur: Safety/medical concerns (Poor planning limits efficiency) Assist Level: Supervision or verbal cues Turns around,maneuvers to table,bed, and toilet,negotiates 3% grade,maneuvers on rugs and over doorsills: No Function - Locomotion: Ambulation Assistive device: Other (comment) (Parallel bars) Max distance: 23f Assist level: Maximal assist (Pt 25 - 49%) Walk 10 feet activity did not occur: Safety/medical concerns (Dependence) Walk 50 feet with 2 turns activity did not occur: Safety/medical concerns Walk 150 feet activity did not occur: Safety/medical concerns Walk 10 feet on uneven surfaces activity did not occur: Safety/medical concerns  Function - Comprehension Comprehension: Auditory Comprehension assist level: Understands basic 50 - 74% of the time/ requires cueing 25 - 49% of the time  Function - Expression Expression: Verbal Expression assist level: Expresses basic 25 - 49% of the  time/requires cueing 50 - 75% of the time. Uses single words/gestures.  Function - Social Interaction Social Interaction assist level: Interacts appropriately less than 25% of the time. May be withdrawn or combative.  Function - Problem Solving Problem solving assist level: Solves basic 25 - 49% of the time - needs direction more than half the time to initiate, plan or complete simple activities  Function - Memory Memory assist level: Recognizes or recalls less than 25% of the time/requires cueing greater than 75% of the time Patient normally able to recall (first 3 days only): None of the above  Medical Problem List and Plan: 1.  Dysarthria and dysphagia as well as decline in mobility and ability to carry out ADL tasks. secondary to bilateral CVA.  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  DVT Prophylaxis/Anticoagulation: Pharmaceutical: Lovenox, monitor platelets, platelets were normal on 02/11/2017 3. Pain Management: N/A 4. Mood: LCSW to follow for evaluation and support.  5. Neuropsych: This patient is not fully capable of making decisions on his own behalf. 6. Skin/Wound Care: routine pressure relief measures.  7. Fluids/Electrolytes/Nutrition: Monitor I/Os. Monitor for signs of dehydration.    D3 Nectar, Intake ~3660mfluid, 70-85% meals  Bmet normal on 08/15 8. HTN: Meds resumed. Monitor for hypotension  in setting of proximal BA stenosis.   Controlled 8/21 Vitals:   02/17/17 1255 02/18/17 0342  BP: 113/69 (!Marland Kitchen  144/76  Pulse: 76 76  Resp: 18 18  Temp: 98.4 F (36.9 C) 97.7 F (36.5 C)  SpO2: 96% 94%   9. Hyperlipidemia: On Zocor.  10. Polysubstance abuse: Counsel on importance of cessation. 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 On ASA, Plavix and Zocor.   LOS (Days) [place] EVALUATION WAS PERFORMED  [employer] E 02/18/2017, 10:12 AM

## 2017-02-18 NOTE — Progress Notes (Signed)
Occupational Therapy Session Note  Patient Details  Name: Brandon Howard MRN: [id number] Date of Birth: [birth year]  Today's Date: 02/18/2017 OT Individual Time: 0730-0826 OT Individual Time Calculation (min): 56 min    Short Term Goals: Week 2:  OT Short Term Goal 1 (Week 2): Pt will complete 2/3 toileting tasks with steadying assist OT Short Term Goal 2 (Week 2): Pt will complete 1 grooming task in standing with min A for steadying in order to increase functional standing balance and endurance OT Short Term Goal 3 (Week 2): Pt will complete stand pivot transfer with hemi walker and mod A in prep for functional tasks.  OT Short Term Goal 4 (Week 2): Pt will don shirt with set-up assist  Skilled Therapeutic Interventions/Progress Updates:     Pt seen for OT ADL bathing/dressing session. Pt sitting in bed upon arrival eating  breakfast with NT present providing supervision, hand off to OT. Pt agreeable to tx session. He transferred to EOB using hospital bed functions and assist wit h use of chuck pad to adjust hips. He ate remainder of breakfast seated EOB with mod cuing required to follow swallow pre-cautions due to big bites, pocketing, and not orally clearing before next bite. He completed mod A stand pivot transfer into w/c. VCs provided for technique of squat pivot, however pt standing automatically for transfer to weaker R side. He declined showering this morning, opting to complete bathing/dressing from w/c level at sink. Hand over hand assist provided to use R UE to wash L, pt with minimal movements of elbow flexion/extension to wash arm. He stood at sink with min Aand steadying assist for dynamic balance while pt completed buttock hygiene and clothing management He dressed UB with set-up assist, independently recalling and implementing hemi dressing technique.  Oral care completed with suction toothbrush and set-up assist.  Pt left seated in w/c at end of session, all needs in reach.   Pt noted to have increased tone in R UE today compared to previous sessions.   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   Therapy/Group: Individual Therapy  [name], Beatrice Ziehm C 02/18/2017, 7:03 AM

## 2017-02-18 NOTE — Progress Notes (Signed)
Speech Language Pathology Daily Session Note  Patient Details  Name: Brandon Howard MRN: [id number] Date of Birth: 11-11-1940  Today's Date: 02/18/2017 SLP Individual Time: 1000-1030 SLP Individual Time Calculation (min): 30 min  Short Term Goals: Week 2: SLP Short Term Goal 1 (Week 2): Pt will communicate basic wants and needs via multimodal communication with Max A cues.  SLP Short Term Goal 2 (Week 2): Pt will utilize speech intelligibility strategies to achieve ~ 50% intelligibility at the word level with Max A cues.  SLP Short Term Goal 3 (Week 2): Pt will answer yes/no questions using multimodalities with ~ 50% accuracy and Min A cues.  SLP Short Term Goal 4 (Week 2): Pt will demonstrate minimal s/s of aspiration with trials of ice chips to demonstrate readiness for repeat instrumental swallow study.   Skilled Therapeutic Interventions: Skilled treatment session focused on dysphagia and cognition goals. SLP facilitated session by providing skilled observation of pt consuming thin water via spoon and cup sips. Pt with 1 cough in 10 spoon sips of thin liquids. However with cups sips, pt with significant coughing with each bolus possibly d/t larger bolus size. Pt required Mod A for use of yes/no with basic questions to achieve ~ 70% accuracy. Recommend instrumental swallow study to assess if controlling bolus size or use of possible compensatory swallow strategies would be able to advance diet. Pt returned to room, left upright in wheelchair with safety belt donned and all needs within reach. Continue per current plan of care.      Function:  Eating Eating   Modified Consistency Diet: No (Trials with SLP) Eating Assist Level: Set up assist for;Supervision or verbal cues           Cognition Comprehension Comprehension assist level: Understands basic 50 - 74% of the time/ requires cueing 25 - 49% of the time  Expression   Expression assist level: Expresses basic 25 - 49% of the  time/requires cueing 50 - 75% of the time. Uses single words/gestures.;Expresses basis less than 25% of the time/requires cueing >75% of the time.  Social Interaction Social Interaction assist level: Interacts appropriately less than 25% of the time. May be withdrawn or combative.  Problem Solving Problem solving assist level: Solves basic 25 - 49% of the time - needs direction more than half the time to initiate, plan or complete simple activities;Solves basic less than 25% of the time - needs direction nearly all the time or does not effectively solve problems and may need a restraint for safety  Memory Memory assist level: Recognizes or recalls less than 25% of the time/requires cueing greater than 75% of the time    Pain    Therapy/Group: Individual Therapy  Uyen Eichholz 02/18/2017, 10:45 AM

## 2017-02-18 NOTE — Progress Notes (Signed)
Speech Language Pathology Daily Session Note  Patient Details  Name: Brandon Howard MRN: [id number] Date of Birth: Oct 31, 1940  Today's Date: 02/18/2017 SLP Individual Time: 1505-1530 SLP Individual Time Calculation (min): 25 min  Short Term Goals: Week 2: SLP Short Term Goal 1 (Week 2): Pt will communicate basic wants and needs via multimodal communication with Max A cues.  SLP Short Term Goal 2 (Week 2): Pt will utilize speech intelligibility strategies to achieve ~ 50% intelligibility at the word level with Max A cues.  SLP Short Term Goal 3 (Week 2): Pt will answer yes/no questions using multimodalities with ~ 50% accuracy and Min A cues.  SLP Short Term Goal 4 (Week 2): Pt will demonstrate minimal s/s of aspiration with trials of ice chips to demonstrate readiness for repeat instrumental swallow study.   Skilled Therapeutic Interventions:  Pt was seen for skilled ST targeting communication goals.  SLP facilitated the session with a simple, generative naming task to address verbal expressive language skills and speech intelligibility.  Pt needed mod assist verbal cues for word finding within task and max to total assist verbal cues for use of slow rate and overarticulation to achieve intelligibility at the word level.  Pt initially demonstrated poor self regulation of intelligibility; however, as session progressed and with SLP cues mentioned above he demonstrated improved monitoring of his functional communication as evidenced by his ability to limit his expression to the word level to facilitate intelligibility.  Pt was left in wheelchair with quick release belt donned and call bell within reach.  Continue per current plan of care.    Function:  Eating Eating                 Cognition Comprehension Comprehension assist level: Understands basic 75 - 89% of the time/ requires cueing 10 - 24% of the time  Expression   Expression assist level: Expresses basis less than 25% of the  time/requires cueing >75% of the time.  Social Interaction Social Interaction assist level: Interacts appropriately 25 - 49% of time - Needs frequent redirection.  Problem Solving Problem solving assist level: Solves basic 25 - 49% of the time - needs direction more than half the time to initiate, plan or complete simple activities  Memory Memory assist level: Recognizes or recalls less than 25% of the time/requires cueing greater than 75% of the time    Pain Pain Assessment Pain Assessment: No/denies pain  Therapy/Group: Individual Therapy  Kassandra Meriweather, Melanee Spry 02/18/2017, 3:32 PM

## 2017-02-18 NOTE — Progress Notes (Signed)
Physical Therapy Session Note  Patient Details  Name: Brandon Howard MRN: [id number] Date of Birth: Jun 02, 1941  Today's Date: 02/18/2017 PT Individual Time: 0900-0930 PT Individual Time Calculation (min): 30 min   Short Term Goals: Week 2:  PT Short Term Goal 1 (Week 2): Pt will perform bed<>chair transfer w/ Min A via stand pivot transfer PT Short Term Goal 2 (Week 2): Pt will ambulate 5' w/ hemiwalker Max A  PT Short Term Goal 3 (Week 2): Pt will demonstrate dynamic sitting balance w/o UE support  Skilled Therapeutic Interventions/Progress Updates: Pt presented in w/c with incontinent urinary void. Performed sit to stand with HW minA for changing shorts. Pt able to maintain standing with HW minA. Performed stand pivot transfer R with HW mod/maxA to bed for cleaning w/c cushion. Performed stand pivot to L maxA return to w/c. Transported pt to gym for time management. Performed stand pivot with HW x 2 with cues for wt shift to R to facilitate transfer. Pt returned to w/c via stand pivot in same manner. Pt returned to room and remained in w/c with call bell within reach and needs met.      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   Therapy/Group: Individual Therapy  Mustapha Colson  Grabiela Wohlford, PTA  02/18/2017, 4:12 PM

## 2017-02-18 NOTE — Progress Notes (Signed)
Orthopedic Tech Progress Note Patient Details:  [name] 1940-12-20 [id number]  Patient ID: Brandon Howard, male   DOB: 01/19/1941, 76 y.o.   MRN: [id number]   [name] 02/18/2017, 11:42 AM Called in advanced brace order; spoke with [facility]

## 2017-02-19 ENCOUNTER — Inpatient Hospital Stay (HOSPITAL_COMMUNITY): Payer: Medicare HMO | Admitting: Occupational Therapy

## 2017-02-19 ENCOUNTER — Inpatient Hospital Stay (HOSPITAL_COMMUNITY): Payer: Medicare HMO | Admitting: Speech Pathology

## 2017-02-19 ENCOUNTER — Inpatient Hospital Stay (HOSPITAL_COMMUNITY): Payer: Medicare HMO | Admitting: Physical Therapy

## 2017-02-19 ENCOUNTER — Inpatient Hospital Stay (HOSPITAL_COMMUNITY): Payer: Medicare HMO

## 2017-02-19 NOTE — Progress Notes (Signed)
Occupational Therapy Weekly Progress Note  Patient Details  Name: Brandon Howard MRN: [id number] Date of Birth: 1940-09-14  Beginning of progress report period: February 12, 2017 End of progress report period: February 19, 2017  Today's Date: 02/19/2017 OT Individual Time: 1000-1100 OT Individual Time Calculation (min): 60 min    Patient has met 2 of 2 short term goals.  Discharged 3rd short term goal as focus changed to use of squat pivot or stand pivots with use of grab bar vs use of hemi walker. Pt making steady progress towards OT goals. Pt cont to be most limited by R LE weakness/ instability impacting safety and independence with transfers and functional standing balance. Pt's R UE able to be used very minimally for functional tasks, however, does have functional grasp despite limited ROM/ strength at all other joints. Pt remains very dysarthric with very limited intelligibility. Pt's family is unable to provide needed physical and cognitive assist at d/c and therefore d/c plans changed to SNF.   Patient continues to demonstrate the following deficits:abnormal posture, apraxia, ataxia, cognitive deficits, hemiplegia affecting dominant side and muscle weakness (generalized)  and therefore will continue to benefit from skilled OT intervention to enhance overall performance with BADL and Reduce care partner burden.  Patient progressing toward long term goals..  Plan of care revisions: Goals modified as pt now plans to d/c to SNF. See POC for goal details. .  OT Short Term Goals Week 2:  OT Short Term Goal 1 (Week 2): Pt will complete 2/3 toileting tasks with steadying assist OT Short Term Goal 1 - Progress (Week 2): Met OT Short Term Goal 2 (Week 2): Pt will complete 1 grooming task in standing with min A for steadying in order to increase functional standing balance and endurance OT Short Term Goal 2 - Progress (Week 2): Met OT Short Term Goal 3 (Week 2): Pt will complete stand pivot  transfer with hemi walker and mod A in prep for functional tasks.  OT Short Term Goal 3 - Progress (Week 2): Other (comment) (Goal no longer apllicable as focusing on squat pivot transfers at this time) OT Short Term Goal 4 (Week 2): Pt will don shirt with set-up assist Week 3:  OT Short Term Goal 1 (Week 3): STG=LTG due to LOS  Skilled Therapeutic Interventions/Progress Updates:    Pt seen for OT ADL bathing/dressing session. Pt sitting up in w/c upon arrival, agreeable to tx session and bathing at shower level.  Completed mod assist stand pivot transfer to [facility] in shower, assist for controlled descent. He bathed in seated position, able to cross ankle over knee to wash feet and reach under BSC to complete buttock hygiene. He transitioned back to w/c where he dressed UB mod I and stood at sink with min A to pull pants up with min A to advance over R hip. With increaed time, pt able to don B socks and L shoe, assist required with donning of R shoe/ AFO. Seated in w/c at sink, completed gross grasping activity with use of washcloth, practicing grasping/ releasing and squeezing of cloth. Min A required for gross UE positioning. Pt requiring significantly increased time, concentration, and trials in order to grasp and release washcloth. Pt left seated in w/c at end of session, all needs in reach.   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   Therapy/Group: Individual Therapy  [name], Chelci [place] C 02/19/2017, 7:09 AM

## 2017-02-19 NOTE — Progress Notes (Signed)
Speech Language Pathology Daily Session Note  Patient Details  Name: Brandon Howard MRN: [id number] Date of Birth: 06-24-1941  Today's Date: 02/19/2017 SLP Individual Time: 1140-1235 SLP Individual Time Calculation (min): 55 min  Short Term Goals: Week 3: SLP Short Term Goal 1 (Week 3): Pt will communicate basic wants and needs via multimodal communication with Max A cues.  SLP Short Term Goal 2 (Week 3): Pt will utilize speech intelligibility strategies to achieve ~ 50% intelligibility at the word level with Max A cues.  SLP Short Term Goal 3 (Week 3): Pt will answer yes/no questions using multimodalities with ~ 50% accuracy and Mod A cues.  SLP Short Term Goal 4 (Week 3): Pt will consume tsp trials of thin water with minimal overt s/s of aspiration and Mod A cues for use of compensatory swallow strategies.    Skilled Therapeutic Interventions:  Pt was seen for skilled ST targeting speech and swallowing goals.  Pt consumed trials of water via teaspoon with mod assist for use of swallowing precautions.  Pt intermittently forgot to use teaspoon and demonstrated coughing on 1 out of 5 cup sips of thin liquids.  No overt s/s of aspiration were evident with teaspoons of thin liquids.  Pt was able to describe objects and actions in pictures with mod assist verbal cues for increased vocal intensity and slow rate to achieve intelligibility at the word level.  When visual context of picture was removed and pt was asked to read a word from a card, he required up to max assist for use of  intelligibility strategies mentioned above.  Pt was noted to attempt to verbally spell words to facilitate improved functional communication; however, given his decreased intelligibility this was not consistently an effective communication strategy.  Pt also had difficulty spelling words when provided with an alphabet board.  Pt was returned to room and left in wheelchair with quick release belt donned and call bell within  reach.  Continue per current plan of care.    Function:  Eating Eating   Modified Consistency Diet: Yes Eating Assist Level: Set up assist for;Supervision or verbal cues   Eating Set Up Assist For: Opening containers       Cognition Comprehension Comprehension assist level: Understands basic 50 - 74% of the time/ requires cueing 25 - 49% of the time  Expression   Expression assist level: Expresses basic 25 - 49% of the time/requires cueing 50 - 75% of the time. Uses single words/gestures.  Social Interaction Social Interaction assist level: Interacts appropriately 50 - 74% of the time - May be physically or verbally inappropriate.  Problem Solving Problem solving assist level: Solves basic 25 - 49% of the time - needs direction more than half the time to initiate, plan or complete simple activities  Memory Memory assist level: Recognizes or recalls less than 25% of the time/requires cueing greater than 75% of the time    Pain Pain Assessment Pain Assessment: No/denies pain  Therapy/Group: Individual Therapy  Leonarda Leis, Melanee Spry 02/19/2017, 2:24 PM

## 2017-02-19 NOTE — Progress Notes (Signed)
Subjective/Complaints:  Severe dysarthria, completed swallowing evaluation  ROS: Limited due to cognitive/behavioral   Objective: Vital Signs: Blood pressure 117/65, pulse 81, temperature 97.8 F (36.6 C), temperature source Oral, resp. rate 18, height 5\' 7"  (1.702 m), weight 71.6 kg (157 lb 12.8 oz), SpO2 93 %. No results found. Results for orders placed or performed during the hospital encounter of 02/04/17 (from the past 72 hour(s))  Basic metabolic panel     Status: None   Collection Time: 02/18/17  4:07 AM  Result Value Ref Range   Sodium 141 135 - 145 mmol/L   Potassium 3.9 3.5 - 5.1 mmol/L   Chloride 105 101 - 111 mmol/L   CO2 29 22 - 32 mmol/L   Glucose, Bld 97 65 - 99 mg/dL   BUN 12 6 - 20 mg/dL   Creatinine, Ser 02/20/17 0.61 - 1.24 mg/dL   Calcium 9.0 8.9 - 0.81 mg/dL   GFR calc non Af Amer >60 >60 mL/min   GFR calc Af Amer >60 >60 mL/min    Comment: (NOTE) The eGFR has been calculated using the CKD EPI equation. This calculation has not been validated in all clinical situations. eGFR's persistently <60 mL/min signify possible Chronic Kidney Disease.    Anion gap 7 5 - 15  CBC     Status: None   Collection Time: 02/18/17  4:07 AM  Result Value Ref Range   WBC 7.5 4.0 - 10.5 K/uL   RBC 5.00 4.22 - 5.81 MIL/uL   Hemoglobin 15.3 13.0 - 17.0 g/dL   HCT 02/20/17 02.0 - 11.4 %   MCV 91.4 78.0 - 100.0 fL   MCH 30.6 26.0 - 34.0 pg   MCHC 33.5 30.0 - 36.0 g/dL   RDW 66.1 98.2 - 04.5 %   Platelets 315 150 - 400 K/uL     HEENT: Normocephalic, atraumatic Cardio: RRR without murmur. No JVD     Resp: CTA Bilaterally without wheezes or rales. Normal effort  GI: BS positive and ND Skin:   Intact. Warm and dry. Neuro:  Right facial droop , dysarthric Cranial Nerve Abnormalities Right central 7 Severe dysarthria Motor 3- to 3/5, right deltoid, biceps, triceps, grip with spastic tone. Still doesn't initiate use of LUE without cueing. 4+/5 right hip flexor, knee extensor,  ankle dorsiflexor and LUE/LLE: 5/5 proximal to distal Musc/Skel:  No edema, no tenderness. Gen. no acute distress.  .   Assessment/Plan: 1. 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 Physiatrist is providing close team supervision and 24 hour management of active medical problems listed below. Physiatrist and rehab team continue to assess barriers to discharge/monitor patient progress toward functional and medical goals. FIM: Function - Bathing Position: Wheelchair/chair at sink Body parts bathed by patient: Chest, Abdomen, Right upper leg, Left upper leg, Left lower leg, Right lower leg, Right arm, Left arm, Front perineal area, Buttocks Body parts bathed by helper: Back Assist Level: Touching or steadying assistance(Pt > 75%)  Function- Upper Body Dressing/Undressing What is the patient wearing?: Pull over shirt/dress Pull over shirt/dress - Perfomed by patient: Thread/unthread right sleeve, Thread/unthread left sleeve, Put head through opening, Pull shirt over trunk Pull over shirt/dress - Perfomed by helper: Thread/unthread left sleeve, Put head through opening Assist Level: Touching or steadying assistance(Pt > 75%) Function - Lower Body Dressing/Undressing What is the patient wearing?: Pants, Non-skid slipper socks, AFO Position: Wheelchair/chair at sink Pants- Performed  by patient: Thread/unthread right pants leg, Thread/unthread left pants leg, Pull pants up/down Pants- Performed by helper: Pull pants up/down, Fasten/unfasten pants Non-skid slipper socks- Performed by patient: Don/doff right sock, Don/doff left sock AFO - Performed by helper: Don/doff right AFO Assist for footwear: Supervision/touching assist Assist for lower body dressing: Touching or steadying assistance (Pt > 75%)  Function - Toileting Toileting steps completed by patient: Adjust  clothing prior to toileting, Performs perineal hygiene Toileting steps completed by helper: Adjust clothing prior to toileting, Performs perineal hygiene, Adjust clothing after toileting Toileting Assistive Devices: Grab bar or rail Assist level: Touching or steadying assistance (Pt.75%)  Function - Air cabin crew transfer assistive device: Grab bar Assist level to toilet: Moderate assist (Pt 50 - 74%/lift or lower) Assist level from toilet: Moderate assist (Pt 50 - 74%/lift or lower)  Function - Chair/bed transfer Chair/bed transfer method: Squat pivot Chair/bed transfer assist level: Moderate assist (Pt 50 - 74%/lift or lower) Chair/bed transfer assistive device: Armrests Chair/bed transfer details: Manual facilitation for weight shifting, Manual facilitation for placement, Verbal cues for precautions/safety, Verbal cues for technique  Function - Locomotion: Wheelchair Will patient use wheelchair at discharge?: Yes Type: Manual Max wheelchair distance: 117f  Assist Level: Supervision or verbal cues Assist Level: Supervision or verbal cues Wheel 150 feet activity did not occur: Safety/medical concerns (Poor planning limits efficiency) Assist Level: Supervision or verbal cues Turns around,maneuvers to table,bed, and toilet,negotiates 3% grade,maneuvers on rugs and over doorsills: No Function - Locomotion: Ambulation Assistive device: Walker-hemi Max distance: 290f Assist level: Moderate assist (Pt 50 - 74%) Walk 10 feet activity did not occur: Safety/medical concerns (Dependence) Assist level: Moderate assist (Pt 50 - 74%) Walk 50 feet with 2 turns activity did not occur: Safety/medical concerns Walk 150 feet activity did not occur: Safety/medical concerns Walk 10 feet on uneven surfaces activity did not occur: Safety/medical concerns  Function - Comprehension Comprehension: Auditory Comprehension assist level: Understands basic 75 - 89% of the time/ requires cueing 10  - 24% of the time  Function - Expression Expression: Verbal Expression assist level: Expresses basis less than 25% of the time/requires cueing >75% of the time.  Function - Social Interaction Social Interaction assist level: Interacts appropriately 25 - 49% of time - Needs frequent redirection.  Function - Problem Solving Problem solving assist level: Solves basic 25 - 49% of the time - needs direction more than half the time to initiate, plan or complete simple activities  Function - Memory Memory assist level: Recognizes or recalls less than 25% of the time/requires cueing greater than 75% of the time Patient normally able to recall (first 3 days only): None of the above  Medical Problem List and Plan: 1.  Dysarthria and dysphagia as well as decline in mobility and ability to carry out ADL tasks. secondary to bilateral CVA.  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  DVT Prophylaxis/Anticoagulation: Pharmaceutical: Lovenox, monitor platelets, platelets were normal on 02/18/2017 3. Pain Management: N/A 4. Mood: LCSW to follow for evaluation and support.  5. Neuropsych: This patient is not fully capable of making decisions on his own behalf. 6. Skin/Wound Care: routine pressure relief measures.  7. Fluids/Electrolytes/Nutrition: Monitor I/Os. Monitor for signs of dehydration.  No change in diet following modified barium swallow  D3 Nectar, 75-90% of meals with good fluid intake  Bmet normal on 08/22 8. HTN: Meds resumed. Monitor for hypotension  in  setting of proximal BA stenosis.   Controlled 8/21 Vitals:   02/18/17 1413 02/19/17 0428  BP: (!) 110/59 117/65  Pulse: 88 81  Resp: 18 18  Temp: 98.4 F (36.9 C) 97.8 F (36.6 C)  SpO2: 96% 93%   9. Hyperlipidemia: On Zocor.  10. Polysubstance abuse: Counsel on importance of cessation. 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 On ASA, Plavix and Zocor.   LOS (Days) 15 A FACE TO FACE EVALUATION WAS PERFORMED  Rochanda Harpham E 02/19/2017, 9:42 AM

## 2017-02-19 NOTE — Progress Notes (Signed)
Social Work Patient ID: Brandon Howard, male   DOB: 1941-01-14, 76 y.o.   MRN: [id number]  Met with pt and spoken with  sister-Margaret via telephone to discuss team conference progress and plan going Forward. All in agreement with plan for him to go to a NH upon discharge from here. They would like him to go to [facility] since close to son's home and familiar with this facility. Will begin working on this plan.

## 2017-02-19 NOTE — Plan of Care (Signed)
Problem: RH BOWEL ELIMINATION Goal: RH STG MANAGE BOWEL WITH ASSISTANCE STG Manage Bowel with Min Assistance.  Outcome: Not Progressing Pt is incontinent at times  Problem: RH BLADDER ELIMINATION Goal: RH STG MANAGE BLADDER WITH ASSISTANCE STG Manage Bladder With Min Assistance  Outcome: Not Progressing Pt incontinent at times

## 2017-02-19 NOTE — Plan of Care (Signed)
Problem: RH Tub/Shower Transfers Goal: LTG Patient will perform tub/shower transfers w/assist (OT) LTG: Patient will perform tub/shower transfers with assist, with/without cues using equipment (OT)  Outcome: Not Applicable Date Met: 65/79/03 Goal d/c as d/c plan changed to SNF

## 2017-02-19 NOTE — Progress Notes (Signed)
Speech Language Pathology Weekly Progress and Session Note  Patient Details  Name: Brandon Howard MRN: [id number] Date of Birth: 19-Feb-1941  Beginning of progress report period: February 12, 2017 End of progress report period: February 19, 2017  Today's Date: 02/19/2017 SLP Individual Time: 0920-0928 SLP Individual Time Calculation (min): 8 min  Short Term Goals: Week 2: SLP Short Term Goal 1 (Week 2): Pt will communicate basic wants and needs via multimodal communication with Max A cues.  SLP Short Term Goal 1 - Progress (Week 2): Not met SLP Short Term Goal 2 (Week 2): Pt will utilize speech intelligibility strategies to achieve ~ 50% intelligibility at the word level with Max A cues.  SLP Short Term Goal 2 - Progress (Week 2): Not met SLP Short Term Goal 3 (Week 2): Pt will answer yes/no questions using multimodalities with ~ 50% accuracy and Min A cues.  SLP Short Term Goal 3 - Progress (Week 2): Not met SLP Short Term Goal 4 (Week 2): Pt will demonstrate minimal s/s of aspiration with trials of ice chips to demonstrate readiness for repeat instrumental swallow study.  SLP Short Term Goal 4 - Progress (Week 2): Not met    New Short Term Goals: Week 3: SLP Short Term Goal 1 (Week 3): Pt will communicate basic wants and needs via multimodal communication with Max A cues.  SLP Short Term Goal 2 (Week 3): Pt will utilize speech intelligibility strategies to achieve ~ 50% intelligibility at the word level with Max A cues.  SLP Short Term Goal 3 (Week 3): Pt will answer yes/no questions using multimodalities with ~ 50% accuracy and Mod A cues.  SLP Short Term Goal 4 (Week 3): Pt will consume tsp trials of thin water with minimal overt s/s of aspiration and Mod A cues for use of compensatory swallow strategies.    Weekly Progress Updates: Pt has made little progress this reporting period and as a result has not met any of his STGs. Most recent MBS on 02/19/17 pt with sensed aspiration of thin  liquids and should remain on dysphagia 3 with nectar thick liquids. Pt continues ot require Total A to Max A cues to communicate basic wants and needs. Given pt's level of assistance, recommend pt receive further services at SNF.      Intensity: Minumum of 1-2 x/day, 30 to 90 minutes Frequency: 3 to 5 out of 7 days Duration/Length of Stay: 3 to 3.5 weeks Treatment/Interventions: Cognitive remediation/compensation;Cueing hierarchy;Dysphagia/aspiration precaution training;Functional tasks;Internal/external aids;Multimodal communication approach;Oral motor exercises;Speech/Language facilitation;Therapeutic Activities;Patient/family education   Daily Session  Skilled Therapeutic Interventions: Skilled treatment session focused on education of swallow function and need to remain on thickened liquids. Pt with no indication of understanding. Pt with some gestural responses but no indication of yes/no.        Function:   Eating Eating   Modified Consistency Diet: No (With SLP only)             Cognition Comprehension Comprehension assist level: Understands basic 50 - 74% of the time/ requires cueing 25 - 49% of the time  Expression   Expression assist level: Expresses basis less than 25% of the time/requires cueing >75% of the time.  Social Interaction Social Interaction assist level: Interacts appropriately less than 25% of the time. May be withdrawn or combative.  Problem Solving Problem solving assist level: Solves basic less than 25% of the time - needs direction nearly all the time or does not effectively solve problems and may  need a restraint for safety;Solves basic 25 - 49% of the time - needs direction more than half the time to initiate, plan or complete simple activities  Memory Memory assist level: Recognizes or recalls less than 25% of the time/requires cueing greater than 75% of the time   General    Pain    Therapy/Group: Individual Therapy  Jovi Alvizo 02/19/2017,  10:27 AM

## 2017-02-19 NOTE — Patient Care Conference (Signed)
Inpatient RehabilitationTeam Conference and Plan of Care Update Date: 02/18/2017   Time: 10:53 AM    Patient Name: Brandon Howard      Medical Record Number: [id number]  Date of Birth: 27-Aug-1940 Sex: Male         Room/Bed: 4W22C/4W22C-01 Payor Info: Payor: [employer] MEDICARE / Plan: [employer] MEDICARE HMO / Product Type: *No Product type* /    Admitting Diagnosis: Bilat CVAs  Admit Date/Time:  02/04/2017  5:16 PM Admission Comments: No comment available   Primary Diagnosis:  Gait disturbance, post-stroke Principal Problem: Gait disturbance, post-stroke  Patient Active Problem List   Diagnosis Date Noted  . Gait disturbance, post-stroke 02/06/2017  . Arterial ischemic stroke, vertebrobasilar, brainstem, acute, unspecified laterality (HCC) 02/04/2017  . Hyperlipidemia   . Polysubstance abuse   . 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 Right hemiparesis (HCC)   . Dysphagia, post-stroke   . History of CVA with residual deficit   . Hyperglycemia   . Benign essential HTN   . Dysarthria, post-stroke   . Cocaine abuse   . Stroke (cerebrum) (HCC) 01/31/2017    Expected Discharge Date: Expected Discharge Date: 02/27/17  Team Members Present: Physician leading conference: Dr. Claudette Laws Social Worker Present: Dossie Der, LCSW Nurse Present: Carlean Purl, RN PT Present: Harless Litten, PTA;Grier Rocher, PT OT Present: Johnsie Cancel, OT SLP Present: Reuel Derby, SLP PPS Coordinator present : Tora Duck, RN, CRRN     Current Status/Progress Goal Weekly Team Focus  Medical   Severe dysarthria, right upper extremity with increasing tone  Improve expressive language ability  Build endurance   Bowel/Bladder   continent of bowel and bladder , LBM 8-22  Remain continent of bowel and bladder  Assist with toileting needs prn, assess bowel pattern q shift   Swallow/Nutrition/ Hydration   coughing with thin liquids   Supervision with least restrictive diet  Completion of modified barium swallow study   ADL's   Min-mod A toileting; Set-up UB bathing/dressing; Min A LB dressing; mod A squat pivot transfers  min A overall  Functional transfers, functional standing balance, d/c planning   Mobility   minA sit to/from stand, minA squat pivot, mod/maxA stand pivot with HW, maxA 51ft gait in parallel bars,   min assist transfers, gait (max), w/c (min)  transfers, R NMR   Communication   Max to Mod A for ~ 50% intelligibility at the word to phrase level  Mod A for basic words  use of compensatory speech strategies   Safety/Cognition/ Behavioral Observations  attention, basic problem solving  Mod A to Max A  basic problem solving   Pain   no c/o pain  no c/o pain  Assess pain q shift and prn   Skin   callus to L heel and left hand, brusing   skin free of infection and breakdown   Assess skin q shift and prn      *See Care Plan and progress notes for long and short-term goals.     Barriers to Discharge  Current Status/Progress Possible Resolutions Date Resolved   Physician    Medical stability;Inaccessible home environment;Lack of/limited family support  History of substance abuse  Slow progress towards goals, some increased right upper extremity strength  Continue rehabilitation program, continue to identify potential caregivers in home environment      Nursing  PT                    OT                  SLP Lack of/limited family support;Decreased caregiver support Significant history of drug use, multiple strokes            SW Decreased caregiver support              Discharge Planning/Teaching Needs:  Family looking at [facility] facilities since can not provide 24 hr care upon discharge from CIR      Team Discussion:  Making slow progress in therapies, can say one word at times, no accurate with yes/no's. MBS tomorrow. BP better controlled. Order for AFO for ankle stability. Cont B &  B. Family unable to provide 24 hr care will need to go to NH for short term.  Revisions to Treatment Plan:  Plan changed to NH upon DC    Continued Need for Acute Rehabilitation Level of Care: The patient requires daily medical management by a physician with specialized training in physical medicine and rehabilitation for the following conditions: Daily analysis of laboratory values and/or radiology reports with any subsequent need for medication adjustment of medical intervention for : Neurological problems;Other  Fonda Rochon, Lemar Livings 02/19/2017, 12:00 PM

## 2017-02-19 NOTE — Progress Notes (Signed)
Modified Barium Swallow Progress Note  Patient Details  Name: Brandon Howard MRN: [id number] Date of Birth: 12/26/1940  Today's Date: 02/19/2017  Modified Barium Swallow completed.  Full report located under Chart Review in the Imaging Section.  Brief recommendations include the following:  Clinical Impression  Pt with no functional improvement in oropharyngeal abilities. Pt continues with moderate to severe sensorimotor deficits c/b delayed swallow initiation to pyriform sinuses with honey thick liquids, nectar thick liquids and thin liquids. As a result, pt with silent penetration to the level of the cords with nectar by spoon. With more natural cup sips of nectar thick liquids, pt with silent penetration and coating to underside of epiglottis but didn't penetrate as deeply as with spoon boluses. Pt with sensed aspiration of thin liquids via spoon and cup sips. Pt unable to clear aspirates with cough. Additionally, throughout study, pt with mild to moderate residue in vallecula and pyriform sinuses. Pt unable to produce double swallow in attempts to clear residue. Although pt with silent penetration of nectar thick liquids, pt has been consuming dysphagia 3 diet with nectar thick liquids since 02/01/17 wihtout respiratory compromise. Recommend continuing dysphagia 3 with nectar thick with strict aspriation precautions  and full nursing supervision. May continue water protocol but would recommend tsp sips.    Swallow Evaluation Recommendations       SLP Diet Recommendations: Dysphagia 3 (Mech soft) solids;Nectar thick liquid   Liquid Administration via: Cup   Medication Administration: Crushed with puree   Supervision: Full supervision/cueing for compensatory strategies   Compensations: Minimize environmental distractions;Slow rate;Small sips/bites;Lingual sweep for clearance of pocketing   Postural Changes: Remain semi-upright after after feeds/meals (Comment);Seated upright at 90  degrees   Oral Care Recommendations: Oral care before and after PO;Oral care BID   Other Recommendations: Order thickener from pharmacy;Prohibited food (jello, ice cream, thin soups)    Dinah Lupa 02/19/2017,10:00 AM

## 2017-02-19 NOTE — Progress Notes (Signed)
Physical Therapy Session Note  Patient Details  Name: Brandon Howard MRN: [id number] Date of Birth: 1940/12/30  Today's Date: 02/19/2017 PT Individual Time: 1415-1530 PT Individual Time Calculation (min): 75 min   Short Term Goals: Week 2:  PT Short Term Goal 1 (Week 2): Pt will perform bed<>chair transfer w/ Min A via stand pivot transfer PT Short Term Goal 2 (Week 2): Pt will ambulate 5' w/ hemiwalker Max A  PT Short Term Goal 3 (Week 2): Pt will demonstrate dynamic sitting balance w/o UE support  Skilled Therapeutic Interventions/Progress Updates:   Pt received sitting in WC and agreeable to PT  Pt noted to have difficulty performing WC propulsion due to poor fitting foot rests. PT adjusted foot rests and instructed pt in WC mobility over level surface of hospital x 277f, 1072f and 15035fAdditional WC mobility training x 200f37f unlevel surface at hospital entrance with min assist to go up hill.   Pt instructed pt in gait training with RAFO and HW x 20ft90f RW x 25 ft/ Mod assist from PT to control hip adduction tone and significant Genuvarus. Pt required R hand support on the RW.   Car transfer training x 23 with max progressing to Mod assist from PT and max cues for improved set up and sequencing to improved safety of transfer.   Orthotist present at end of PT treatment to assess gait with HW and AFO. Orthotist recommends AFO with Lateral upright to improve knee control and prevent Genuvarus as well as improve foot clearance and improve gait pattern. PT educated pt on need for shoes qwith higher counter than provided by [employer] shoe.   Patient returned too room and [facility] reach and all needs met.        Therapy Documentation Precautions:  Precautions Precautions: Fall Restrictions Weight Bearing Restrictions: No Other Position/Activity Restrictions: Dysarthria from prior stroke with prior R sided weakness Vital Signs: Therapy  Vitals Temp: 98.5 F (36.9 C) Temp Source: Oral Pulse Rate: 83 Resp: 18 BP: 120/61 Patient Position (if appropriate): Sitting Oxygen Therapy SpO2: 98 % O2 Device: Not Delivered Pain: Pain Assessment Pain Assessment: No/denies pain   See Function Navigator for Current Functional Status.   Therapy/Group: Individual Therapy  AustiLorie Phenix/2018, 3:54 PM

## 2017-02-20 ENCOUNTER — Inpatient Hospital Stay (HOSPITAL_COMMUNITY): Payer: Medicare HMO | Admitting: Occupational Therapy

## 2017-02-20 ENCOUNTER — Inpatient Hospital Stay (HOSPITAL_COMMUNITY): Payer: Medicare HMO | Admitting: Speech Pathology

## 2017-02-20 ENCOUNTER — Inpatient Hospital Stay (HOSPITAL_COMMUNITY): Payer: Medicare HMO | Admitting: Physical Therapy

## 2017-02-20 NOTE — Progress Notes (Signed)
Speech Language Pathology Daily Session Note  Patient Details  Name: Brandon Howard MRN: [id number] Date of Birth: 20-Feb-1941  Today's Date: 02/20/2017 SLP Individual Time: 0830-0925 SLP Individual Time Calculation (min): 55 min  Short Term Goals: Week 3: SLP Short Term Goal 1 (Week 3): Pt will communicate basic wants and needs via multimodal communication with Max A cues.  SLP Short Term Goal 2 (Week 3): Pt will utilize speech intelligibility strategies to achieve ~ 50% intelligibility at the word level with Max A cues.  SLP Short Term Goal 3 (Week 3): Pt will answer yes/no questions using multimodalities with ~ 50% accuracy and Mod A cues.  SLP Short Term Goal 4 (Week 3): Pt will consume tsp trials of thin water with minimal overt s/s of aspiration and Mod A cues for use of compensatory swallow strategies.    Skilled Therapeutic Interventions: Skilled treatment session focused on communication goals. Upon arrival, patient has just completed breakfast, therefore, the water protocol was not administered. SLP facilitated session by providing Max A verbal cues for use of a slow rate and an increased vocal intensity to achieve 100% intelligibility at the word level during a reading task and ~50-75% at the phrase level. SLP also utilized audio feedback to maximize awareness of speech impairments. Patient left upright in wheelchair with all needs within reach. Continue with current plan of care.      Function:  Cognition Comprehension Comprehension assist level: Understands basic 50 - 74% of the time/ requires cueing 25 - 49% of the time  Expression   Expression assist level: Expresses basic 25 - 49% of the time/requires cueing 50 - 75% of the time. Uses single words/gestures.  Social Interaction Social Interaction assist level: Interacts appropriately less than 25% of the time. May be withdrawn or combative.  Problem Solving Problem solving assist level: Solves basic less than 25% of the time  - needs direction nearly all the time or does not effectively solve problems and may need a restraint for safety  Memory Memory assist level: Recognizes or recalls less than 25% of the time/requires cueing greater than 75% of the time    Pain No/Denies Pain   Therapy/Group: Individual Therapy  Shaleah Nissley 02/20/2017, 3:00 PM

## 2017-02-20 NOTE — Progress Notes (Signed)
Occupational Therapy Session Note  Patient Details  Name: Brandon Howard MRN: [id number] Date of Birth: Nov 05, 1940  Today's Date: 02/20/2017 OT Individual Time: 1300-1415 OT Individual Time Calculation (min): 75 min    Short Term Goals: Week 3:  OT Short Term Goal 1 (Week 3): STG=LTG due to LOS  Skilled Therapeutic Interventions/Progress Updates:    Pt seen for OT session focusing on functional transfers, ADL re-training, and functional standing balance/ endurance. Pt sitting up in w/c finishing lunch with NT present providing supervision. Pt agreeable to tx session, declining bathing this session. Practiced toilet transfers via squat pivot with use of grab bar. Required multimodal cuing for recall of technique of pivot transfer, mod A squat pivot to weaker R side, Min A when transferring to L. Required min steadying assist while pt pulled pants down, increased assist required in order to pull pants back up. Pt unaware of stool in brief, pt able to complete buttock hygiene with steadying assist, therapist assisting for thoroughness. Pt changed shirts and completed grooming tasks at sink with set-up assist.  He self propelled w/c to therapy gym using hemi technique with supervision. Provided pt with shoe button and practiced donning/doffing of shoes including R AFO. Following demonstration, pt able to independently doff R shoe/AFO, however, requires max A to don AFO.  Completed dynamic standing activity, standing at hemi walker and tossing bean bags. Pt required to read name of food item labeled on bean bag, intelligibility ~75% for basic words. Min steadying for dynamic balance. Pt returned to room in same manner as described above. Left seated in w/c with all needs in reach.   Therapy Documentation Precautions:  Precautions Precautions: Fall Restrictions Weight Bearing Restrictions: No Other Position/Activity Restrictions: Dysarthria from prior stroke with prior R sided  weakness Pain:   No/ denies pain  See Function Navigator for Current Functional Status.   Therapy/Group: Individual Therapy  [name], Linard Daft C 02/20/2017, 7:09 AM

## 2017-02-20 NOTE — Plan of Care (Signed)
Problem: RH KNOWLEDGE DEFICIT Goal: RH STG INCREASE KNOWLEDGE OF DYSPHAGIA/FLUID INTAKE Pt and family will demonstrate increased understanding of dysphagia/fluid intake with Min Assist with resources.  Outcome: Not Progressing Requires cuing during full supervision meals for small bites and sips of fluid with reminders to swallow between bites

## 2017-02-20 NOTE — Progress Notes (Signed)
Physical Therapy Session Note  Patient Details  Name: Brandon Howard MRN: [id number] Date of Birth: June 10, 1941  Today's Date: 02/20/2017 PT Individual Time: 1105-1200 PT Individual Time Calculation (min): 55 min   Short Term Goals: Week 1:  PT Short Term Goal 1 (Week 1): Pt will perform supine to sit and return with min assist. PT Short Term Goal 1 - Progress (Week 1): Met PT Short Term Goal 2 (Week 1): Pt will perform sit to stand with min assist PT Short Term Goal 2 - Progress (Week 1): Met PT Short Term Goal 3 (Week 1): Pt will perform bed to chair transfer with min assist. PT Short Term Goal 3 - Progress (Week 1): Progressing toward goal Week 2:  PT Short Term Goal 1 (Week 2): Pt will perform bed<>chair transfer w/ Min A via stand pivot transfer PT Short Term Goal 2 (Week 2): Pt will ambulate 5' w/ hemiwalker Max A  PT Short Term Goal 3 (Week 2): Pt will demonstrate dynamic sitting balance w/o UE support  Skilled Therapeutic Interventions/Progress Updates:    Pt received sitting in WC and agreeable to PT  Gait training instructed by PT with Orthotist present  2 x 49f with mod assist, RAFO and Tstrap as well as HW. Pt noted to have improved knee and ankle stability with AFO and Tstrap.   Squat pivot transfer training Transfer training with mod assist to R and L with moderate cues for set up and proper LE positioning to improve safety of transfer and prevent LOB to the R.   Supine NMR  SAQ,  Heel slides,  Hip abduction,  Isometric hip adduction to squeeze ball,  Bridges.  All completed x 10 BLE with AAROM on the RLEas needed. Moderate cues from PT to reduce compensation through LLE and trunk as well as increased eccentric control.   [facility] and all needs met.        Therapy Documentation Precautions:  Precautions Precautions: Fall Restrictions Weight Bearing Restrictions: No Other Position/Activity  Restrictions: Dysarthria from prior stroke with prior R sided weakness Pain: Pain Assessment Pain Score: 0-No pain   See Function Navigator for Current Functional Status.   Therapy/Group: Individual Therapy  ALorie Phenix8/24/2018, 12:17 PM

## 2017-02-20 NOTE — Progress Notes (Signed)
Subjective/Complaints:  No issues overnite, good appetite, severe dysarthria  ROS: Limited due to cognitive/behavioral   Objective: Vital Signs: Blood pressure 118/65, pulse 85, temperature 98.8 F (37.1 C), temperature source Oral, resp. rate 17, height _0  (1.702 m), weight 71.6 kg (157 lb 12.8 oz), SpO2 97 %. Dg Swallowing Func-speech Pathology  Result Date: 02/19/2017 Objective Swallowing Evaluation: Type of Study: MBS-Modified Barium Swallow Study Patient Details Name: Brandon Howard MRN: [id number] Date of Birth: 1940-12-30 Today's Date: 02/19/2017 Time: SLP Start Time (ACUTE ONLY): 1041-SLP Stop Time (ACUTE ONLY): 1057 SLP Time Calculation (min) (ACUTE ONLY): 16 min Past Medical History: Past Medical History: Diagnosis Date . Gout  . Hepatitis  . Hypertension  . Pancreatitis  . Right knee pain  . Stroke ([place])  . Vitamin B 12 deficiency  Past Surgical History: Past Surgical History: Procedure Laterality Date . CHOLECYSTECTOMY   . CORONARY ARTERY BYPASS GRAFT  2005 HPI: Brandon Soderlund Underwoodis a 76 y.o.malepast medical history significant for hypertension and stroke. Presents emergency room with right-sided weakness and slurred speech. Patient states symptoms started yesterday. Did not call for assistance until today. Difficult walking. EMS activated. Brought patient to the hospital for evaluation. Patient symptoms have not worsened or improved. ED course: CT of the head revealed new stroke. MRI of head showinginfracrts left worse then right pons, bilateral cerebellum and right thalamus as well as remote infarcts in right cerebellum and left corpus collosum. Subjective: The patient was seen in radiology for MBS to determine current swallowing physiology.  Assessment / Plan / Recommendation CHL IP CLINICAL IMPRESSIONS 02/19/2017 Clinical Impression Pt with no functional improvement in oropharyngeal abilities. Pt continues with moderate to severe sensorimotor deficits c/b delayed swallow  initiation to pyriform sinuses with honey thick liquids, nectar thick liquids and thin liquids. As a result, pt with silent penetration to the level of the cords with nectar by spoon. With more natural cup sips of nectar thick liquids, pt with silent penetration and coating to underside of epiglottis but didn't penetrate as deeply as with spoon boluses. Pt with sensed aspiration of thin liquids via spoon and cup sips. Pt unable to clear aspirates with cough. Additionally, throughout study, pt with mild to moderate residue in vallecula and pyriform sinuses. Pt unable to produce double swallow in attempts to clear residue. Although pt with silent penetration of nectar thick liquids, pt has been consuming dysphagia 3 diet with nectar thick liquids since 02/01/17 wihtout respiratory compromise. Recommend continuing dysphagia 3 with nectar thick with strict aspriation precautions  and full nursing supervision. May continue water protocol but would recommend tsp sips.  SLP Visit Diagnosis Dysphagia, oropharyngeal phase (R13.12) Attention and concentration deficit following -- Frontal lobe and executive function deficit following -- Impact on safety and function Moderate aspiration risk   CHL IP TREATMENT RECOMMENDATION 02/19/2017 Treatment Recommendations Therapy as outlined in treatment plan below   Prognosis 02/01/2017 Prognosis for Safe Diet Advancement Fair Barriers to Reach Goals -- Barriers/Prognosis Comment -- CHL IP DIET RECOMMENDATION 02/19/2017 SLP Diet Recommendations Dysphagia 3 (Mech soft) solids;Nectar thick liquid Liquid Administration via Cup Medication Administration Crushed with puree Compensations Minimize environmental distractions;Slow rate;Small sips/bites;Lingual sweep for clearance of pocketing Postural Changes Remain semi-upright after after feeds/meals (Comment);Seated upright at 90 degrees   CHL IP OTHER RECOMMENDATIONS 02/19/2017 Recommended Consults -- Oral Care Recommendations Oral care before and  after PO;Oral care BID Other Recommendations Order thickener from pharmacy;Prohibited food (jello, ice cream, thin soups)   CHL IP FOLLOW UP RECOMMENDATIONS 02/04/2017  Follow up Recommendations Inpatient Rehab   CHL IP FREQUENCY AND DURATION 02/01/2017 Speech Therapy Frequency (ACUTE ONLY) min 2x/week Treatment Duration 2 weeks      CHL IP ORAL PHASE 02/19/2017 Oral Phase Impaired Oral - Pudding Teaspoon -- Oral - Pudding Cup -- Oral - Honey Teaspoon -- Oral - Honey Cup -- Oral - Nectar Teaspoon Weak lingual manipulation;Lingual pumping;Incomplete tongue to palate contact Oral - Nectar Cup Lingual pumping;Incomplete tongue to palate contact;Weak lingual manipulation Oral - Nectar Straw -- Oral - Thin Teaspoon Incomplete tongue to palate contact;Weak lingual manipulation;Lingual pumping Oral - Thin Cup Weak lingual manipulation;Lingual pumping;Incomplete tongue to palate contact Oral - Thin Straw -- Oral - Puree NT Oral - Mech Soft -- Oral - Regular -- Oral - Multi-Consistency NT Oral - Pill -- Oral Phase - Comment --  CHL IP PHARYNGEAL PHASE 02/19/2017 Pharyngeal Phase Impaired Pharyngeal- Pudding Teaspoon -- Pharyngeal -- Pharyngeal- Pudding Cup -- Pharyngeal -- Pharyngeal- Honey Teaspoon -- Pharyngeal -- Pharyngeal- Honey Cup Delayed swallow initiation-pyriform sinuses Pharyngeal -- Pharyngeal- Nectar Teaspoon Delayed swallow initiation-pyriform sinuses;Reduced tongue base retraction;Penetration/Aspiration during swallow;Penetration/Apiration after swallow;Pharyngeal residue - valleculae;Pharyngeal residue - pyriform Pharyngeal Material enters airway, CONTACTS cords and not ejected out Pharyngeal- Nectar Cup Delayed swallow initiation-pyriform sinuses;Reduced tongue base retraction;Penetration/Aspiration during swallow;Penetration/Apiration after swallow;Pharyngeal residue - pyriform;Pharyngeal residue - valleculae Pharyngeal Material enters airway, remains ABOVE vocal cords then ejected out Pharyngeal- Nectar Straw --  Pharyngeal -- Pharyngeal- Thin Teaspoon Delayed swallow initiation-pyriform sinuses;Reduced tongue base retraction;Penetration/Aspiration during swallow;Penetration/Apiration after swallow;Moderate aspiration;Pharyngeal residue - pyriform;Pharyngeal residue - valleculae Pharyngeal Material enters airway, passes BELOW cords and not ejected out despite cough attempt by patient Pharyngeal- Thin Cup Delayed swallow initiation-pyriform sinuses;Reduced tongue base retraction;Penetration/Aspiration during swallow;Penetration/Apiration after swallow;Moderate aspiration;Pharyngeal residue - valleculae;Pharyngeal residue - pyriform Pharyngeal Material enters airway, passes BELOW cords and not ejected out despite cough attempt by patient Pharyngeal- Thin Straw -- Pharyngeal -- Pharyngeal- Puree NT Pharyngeal -- Pharyngeal- Mechanical Soft -- Pharyngeal -- Pharyngeal- Regular -- Pharyngeal -- Pharyngeal- Multi-consistency NT Pharyngeal -- Pharyngeal- Pill -- Pharyngeal -- Pharyngeal Comment --  CHL IP CERVICAL ESOPHAGEAL PHASE 02/19/2017 Cervical Esophageal Phase WFL Pudding Teaspoon -- Pudding Cup -- Honey Teaspoon -- Honey Cup -- Nectar Teaspoon -- Nectar Cup -- Nectar Straw -- Thin Teaspoon -- Thin Cup -- Thin Straw -- Puree -- Mechanical Soft -- Regular -- Multi-consistency -- Pill -- Cervical Esophageal Comment -- No flowsheet data found. Happi Overton 02/19/2017, 10:00 AM              Results for orders placed or performed during the hospital encounter of 02/04/17 (from the past 72 hour(s))  Basic metabolic panel     Status: None   Collection Time: 02/18/17  4:07 AM  Result Value Ref Range   Sodium 141 135 - 145 mmol/L   Potassium 3.9 3.5 - 5.1 mmol/L   Chloride 105 101 - 111 mmol/L   CO2 29 22 - 32 mmol/L   Glucose, Bld 97 65 - 99 mg/dL   BUN 12 6 - 20 mg/dL   Creatinine, Ser 0.79 0.61 - 1.24 mg/dL   Calcium 9.0 8.9 - 10.3 mg/dL   GFR calc non Af Amer >60 >60 mL/min   GFR calc Af Amer >60 >60 mL/min     Comment: (NOTE) The eGFR has been calculated using the CKD EPI equation. This calculation has not been validated in all clinical situations. eGFR's persistently <60 mL/min signify possible Chronic Kidney Disease.    Anion gap 7 5 -  15  CBC     Status: None   Collection Time: 02/18/17  4:07 AM  Result Value Ref Range   WBC 7.5 4.0 - 10.5 K/uL   RBC 5.00 4.22 - 5.81 MIL/uL   Hemoglobin 15.3 13.0 - 17.0 g/dL   HCT 45.7 39.0 - 52.0 %   MCV 91.4 78.0 - 100.0 fL   MCH 30.6 26.0 - 34.0 pg   MCHC 33.5 30.0 - 36.0 g/dL   RDW 13.2 11.5 - 15.5 %   Platelets 315 150 - 400 K/uL     HEENT: Normocephalic, atraumatic Cardio: RRR without murmur. No JVD     Resp: CTA Bilaterally without wheezes or rales. Normal effort  GI: BS positive and ND Skin:   Intact. Warm and dry. Neuro:  Right facial droop , dysarthric Cranial Nerve Abnormalities Right central 7 Severe dysarthria Motor 3- to 3/5, right deltoid, biceps, triceps, grip with spastic tone. Still doesn't initiate use of LUE without cueing. 4+/5 right hip flexor, knee extensor, ankle dorsiflexor and LUE/LLE: 5/5 proximal to distal Musc/Skel:  No edema, no tenderness. Gen. no acute distress.  .   Assessment/Plan: 1. 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 Physiatrist is providing close team supervision and 24 hour management of active medical problems listed below. Physiatrist and rehab team continue to assess barriers to discharge/monitor patient progress toward functional and medical goals. FIM: Function - Bathing Position: Shower Body parts bathed by patient: Chest, Abdomen, Right upper leg, Left upper leg, Left lower leg, Right lower leg, Right arm, Left arm, Front perineal area, Buttocks Body parts bathed by helper: Back Assist Level: Supervision or verbal cues  Function- Upper Body  Dressing/Undressing What is the patient wearing?: Pull over shirt/dress Pull over shirt/dress - Perfomed by patient: Thread/unthread right sleeve, Thread/unthread left sleeve, Put head through opening, Pull shirt over trunk Pull over shirt/dress - Perfomed by helper: Thread/unthread left sleeve, Put head through opening Assist Level: More than reasonable time Function - Lower Body Dressing/Undressing What is the patient wearing?: Pants, Non-skid slipper socks, AFO Position: Wheelchair/chair at sink Pants- Performed by patient: Thread/unthread right pants leg, Thread/unthread left pants leg, Pull pants up/down Pants- Performed by helper: Fasten/unfasten pants Non-skid slipper socks- Performed by patient: Don/doff right sock, Don/doff left sock AFO - Performed by helper: Don/doff right AFO Assist for footwear: Partial/moderate assist Assist for lower body dressing: Touching or steadying assistance (Pt > 75%)  Function - Toileting Toileting steps completed by patient: Adjust clothing prior to toileting, Performs perineal hygiene Toileting steps completed by helper: Adjust clothing prior to toileting, Performs perineal hygiene, Adjust clothing after toileting Toileting Assistive Devices: Grab bar or rail Assist level: Touching or steadying assistance (Pt.75%)  Function - Air cabin crew transfer assistive device: Grab bar Assist level to toilet: Moderate assist (Pt 50 - 74%/lift or lower) Assist level from toilet: Moderate assist (Pt 50 - 74%/lift or lower)  Function - Chair/bed transfer Chair/bed transfer method: Squat pivot Chair/bed transfer assist level: Moderate assist (Pt 50 - 74%/lift or lower) Chair/bed transfer assistive device: Armrests Chair/bed transfer details: Manual facilitation for weight shifting, Manual facilitation for placement, Verbal cues for precautions/safety, Verbal cues for technique  Function - Locomotion: Wheelchair Will patient use wheelchair at  discharge?: Yes Type: Manual Max wheelchair distance: 262f Assist Level: Supervision or verbal cues Assist Level: Supervision or verbal cues Wheel 150 feet activity did not occur: Safety/medical  concerns (Poor planning limits efficiency) Assist Level: Supervision or verbal cues Turns around,maneuvers to table,bed, and toilet,negotiates 3% grade,maneuvers on rugs and over doorsills: No Function - Locomotion: Ambulation Assistive device: Walker-rolling Max distance: 25 Assist level: Moderate assist (Pt 50 - 74%) Walk 10 feet activity did not occur: Safety/medical concerns (Dependence) Assist level: Moderate assist (Pt 50 - 74%) Walk 50 feet with 2 turns activity did not occur: Safety/medical concerns Walk 150 feet activity did not occur: Safety/medical concerns Walk 10 feet on uneven surfaces activity did not occur: Safety/medical concerns  Function - Comprehension Comprehension: Auditory Comprehension assist level: Understands basic 50 - 74% of the time/ requires cueing 25 - 49% of the time  Function - Expression Expression: Verbal Expression assist level: Expresses basic 25 - 49% of the time/requires cueing 50 - 75% of the time. Uses single words/gestures.  Function - Social Interaction Social Interaction assist level: Interacts appropriately 50 - 74% of the time - May be physically or verbally inappropriate.  Function - Problem Solving Problem solving assist level: Solves basic 25 - 49% of the time - needs direction more than half the time to initiate, plan or complete simple activities  Function - Memory Memory assist level: Recognizes or recalls less than 25% of the time/requires cueing greater than 75% of the time Patient normally able to recall (first 3 days only): None of the above  Medical Problem List and Plan: 1.  Dysarthria and dysphagia as well as decline in mobility and ability to carry out ADL tasks. secondary to bilateral CVA.  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  DVT Prophylaxis/Anticoagulation: Pharmaceutical: Lovenox, monitor platelets, platelets were normal on 02/18/2017 3. Pain Management: N/A 4. Mood: LCSW to follow for evaluation and support.  5. Neuropsych: This patient is not fully capable of making decisions on his own behalf. 6. Skin/Wound Care: routine pressure relief measures.  7. Fluids/Electrolytes/Nutrition: Monitor I/Os. Monitor for signs of dehydration.  No change in diet following modified barium swallow  D3 Nectar, 75-90% of meals with good fluid intake  Bmet normal on 08/22 8. HTN: Meds resumed. Monitor for hypotension  in setting of proximal BA stenosis.   Controlled 8/24 Vitals:   02/19/17 1318 02/20/17 0305  BP: 120/61 118/65  Pulse: 83 85  Resp: 18 17  Temp: 98.5 F (36.9 C) 98.8 F (37.1 C)  SpO2: 98% 97%   9. Hyperlipidemia: On Zocor.  10. Polysubstance abuse: Counsel on importance of cessation. 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 On ASA, Plavix and Zocor.   LOS (Days) [place]  KIRSTEINS,ANDREW E 02/20/2017, 8:30 AM

## 2017-02-21 ENCOUNTER — Inpatient Hospital Stay (HOSPITAL_COMMUNITY): Payer: Medicare HMO | Admitting: Occupational Therapy

## 2017-02-21 ENCOUNTER — Inpatient Hospital Stay (HOSPITAL_COMMUNITY): Payer: Medicare HMO | Admitting: Speech Pathology

## 2017-02-21 ENCOUNTER — Inpatient Hospital Stay (HOSPITAL_COMMUNITY): Payer: Medicare HMO | Admitting: Physical Therapy

## 2017-02-21 NOTE — Progress Notes (Signed)
  Subjective/Complaints:  Feels well, no complaints Participating in therapy  Objective: Vital Signs: Blood pressure 116/71, pulse 87, temperature 98.3 F (36.8 C), temperature source Oral, resp. rate 19, height 5\' 7"  (1.702 m), weight 157 lb 12.8 oz (71.6 kg), SpO2 95 %.  HEENT: atraumatic, normocephalic cv- regrate, no jvd     Chest- cta abd- soft Neuro: Right facial droop , dysarthric Alert .   Assessment/Plan: 1. 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  Dysarthria and dysphagia as well as decline in mobility and ability to carry out ADL tasks. secondary to bilateral CVA.  Continue CIR  2.  DVT Prophylaxis/Anticoagulation: Pharmaceutical: Lovenox, monitor platelets, platelets were normal on 02/18/2017 3. Pain Management: N/A 4. Mood: LCSW to follow for evaluation and support.  5. Neuropsych: This patient is not fully capable of making decisions on his own behalf. 6. Skin/Wound Care: routine pressure relief measures.  7. Fluids/Electrolytes/Nutrition: Monitor I/Os. Monitor for signs of dehydration.  No change in diet following modified barium swallow  D3 Nectar, 75-90% of meals with good fluid intake  Bmet  Basic Metabolic Panel:    Component Value Date/Time   NA 141 02/18/2017 0407   K 3.9 02/18/2017 0407   CL 105 02/18/2017 0407   CO2 29 02/18/2017 0407   BUN 12 02/18/2017 0407   CREATININE 0.79 02/18/2017 0407   GLUCOSE 97 02/18/2017 0407   CALCIUM 9.0 02/18/2017 0407    8. HTN: Meds resumed. Monitor for hypotension  in setting of proximal BA stenosis.   Controlled 8/24 Vitals:   02/21/17 0551 02/21/17 0901  BP: (!) 141/70 116/71  Pulse: 87   Resp:    Temp: 98.3 F (36.8 C)   SpO2: 95%    9. Hyperlipidemia: On Zocor.  10. Polysubstance abuse: C 11. CAD s/p CABG: Asymptomatic. On ASA, Plavix and Zocor.   LOS (Days) 17 A FACE TO FACE EVALUATION WAS PERFORMED  Bruce H Swords 02/21/2017, 11:06  AM

## 2017-02-21 NOTE — Progress Notes (Signed)
Physical Therapy Weekly Progress Note  Patient Details  Name: Brandon Howard MRN: [id number] Date of Birth: Mar 14, 1941  Beginning of progress report period: February 13, 2017 End of progress report period: February 21, 2017  Today's Date: 02/21/2017 PT Individual Time: 1000-1100 PT Individual Time Calculation (min): 60 min   Patient has met 3 of 3 short term goals.  Pt has demonstrated steady progress towards long term goals. Pt able to consistently perform squat pivot transfers with min assist, but conties to require moderate cues for proper set up and sequencing. Pt has progressed to ambulate up to 61f with moderate assist from PT using RAFO and HW.  Patient continues to demonstrate the following deficits muscle weakness, impaired timing and sequencing, unbalanced muscle activation and decreased coordination and decreased sitting balance, decreased standing balance, decreased postural control, hemiplegia and decreased balance strategies and therefore will continue to benefit from skilled PT intervention to increase functional independence with mobility.  Patient progressing toward long term goals..  Continue plan of care.  PT Short Term Goals Week 2:  PT Short Term Goal 1 (Week 2): Pt will perform bed<>chair transfer w/ Min A via stand pivot transfer PT Short Term Goal 1 - Progress (Week 2): Met PT Short Term Goal 2 (Week 2): Pt will ambulate 5' w/ hemiwalker Max A  PT Short Term Goal 2 - Progress (Week 2): Met PT Short Term Goal 3 (Week 2): Pt will demonstrate dynamic sitting balance w/o UE support PT Short Term Goal 3 - Progress (Week 2): Met Week 3:  PT Short Term Goal 1 (Week 3): STG =LTG due to Estimated d/c date.   Skilled Therapeutic Interventions/Progress Updates:   Pt received sitting in WC and agreeable to PT  PT instructed pt in WC mobility x 2076fwith supervision assist from PT and min cues for increased use of LUE to reduce energy expenditure on the LLE.   PT  instructed pt in car transfer training with squat pivot technique min assist and moderate cues for sequencing and set up to improve safety of transfer and prevent R lateral LOB.   Squat pivot to and from nustep with min assist and min cues for proper UE and LE positioning to improved equalized use of BLE and prevent LOB to the R.   Nustep reciprocal movement training x 8 minutes level 2 with RLE and RUE support. Moderate cues for improved ROM to reduce stress on RUE and improve symmetry of movement.   PT adjusted RAFO due to poor fitting Tstrap and educated pt in proper donning technique.   Forced use of the RUE to tap ball with BUE on 1lb bar. 3 x 12 with noted improvement in RUE ROM by 3rd set.   [facility] to [facility] and all needs met.          Therapy Documentation Precautions:  Precautions Precautions: Fall Restrictions Weight Bearing Restrictions: No Other Position/Activity Restrictions: Dysarthria from prior stroke with prior R sided weakness Vital Signs: Therapy Vitals BP: 116/71 Pain: Pain Assessment Pain Assessment: No/denies pain   See Function Navigator for Current Functional Status.  Therapy/Group: Individual Therapy  AuLorie Phenix/25/2018, 11:34 AM

## 2017-02-21 NOTE — Progress Notes (Signed)
Speech Language Pathology Daily Session Note  Patient Details  Name: Brandon Howard MRN: [id number] Date of Birth: 04-27-1941  Today's Date: 02/21/2017 SLP Individual Time: 1400-1500 SLP Individual Time Calculation (min): 60 min  Short Term Goals: Week 3: SLP Short Term Goal 1 (Week 3): Pt will communicate basic wants and needs via multimodal communication with Max A cues.  SLP Short Term Goal 2 (Week 3): Pt will utilize speech intelligibility strategies to achieve ~ 50% intelligibility at the word level with Max A cues.  SLP Short Term Goal 3 (Week 3): Pt will answer yes/no questions using multimodalities with ~ 50% accuracy and Mod A cues.  SLP Short Term Goal 4 (Week 3): Pt will consume tsp trials of thin water with minimal overt s/s of aspiration and Mod A cues for use of compensatory swallow strategies.    Skilled Therapeutic Interventions: Skilled ST focused on dysphagia and cognition goals. SLP facilitated session by providing skilled observation of pt consuming tsp trials of thin water with no overt s/s of aspiration with Min A cues for use of compensatory swallow strategies. Pt required Max A cues for ~ 50% accuracy with yes/no questions. Of note, pt much more verbal this session but despite Max A cues, pt only able to achieve ~ 50% intelligibility at the word level. Pt was left upright in wheelchair with all needs within reach. Continue per current plan of care.      Function:  Eating Eating   Modified Consistency Diet: No (Trials with SLP ) Eating Assist Level: Set up assist for;Supervision or verbal cues   Eating Set Up Assist For: Opening containers       Cognition Comprehension Comprehension assist level: Understands basic 75 - 89% of the time/ requires cueing 10 - 24% of the time;Understands basic 50 - 74% of the time/ requires cueing 25 - 49% of the time  Expression   Expression assist level: Expresses basic 25 - 49% of the time/requires cueing 50 - 75% of the time.  Uses single words/gestures.;Expresses basis less than 25% of the time/requires cueing >75% of the time.  Social Interaction Social Interaction assist level: Interacts appropriately less than 25% of the time. May be withdrawn or combative.  Problem Solving Problem solving assist level: Solves basic less than 25% of the time - needs direction nearly all the time or does not effectively solve problems and may need a restraint for safety;Solves basic 25 - 49% of the time - needs direction more than half the time to initiate, plan or complete simple activities  Memory Memory assist level: Recognizes or recalls less than 25% of the time/requires cueing greater than 75% of the time    Pain    Therapy/Group: Individual Therapy  Harley Fitzwater 02/21/2017, 3:10 PM

## 2017-02-21 NOTE — Progress Notes (Signed)
Occupational Therapy Session Note  Patient Details  Name: Brandon Howard MRN: [id number] Date of Birth: [birth year]  Today's Date: 02/21/2017 OT Individual Time: 1500-1530 OT Individual Time Calculation (min): 30 min    Short Term Goals: Week 1:  OT Short Term Goal 1 (Week 1): Pt will complete stand pivot transfer w/c <> toilet with mod A OT Short Term Goal 1 - Progress (Week 1): Partly met OT Short Term Goal 2 (Week 1): Pt will recall and utilize hemi dressing techniques with min VCs  OT Short Term Goal 2 - Progress (Week 1): Met OT Short Term Goal 3 (Week 1): Pt will maintain static standing balance with min A and UE support in order to decrease caregiver burden during ADL tasks OT Short Term Goal 3 - Progress (Week 1): Met OT Short Term Goal 4 (Week 1): Pt will use R UE at gross assist stabilizer level during functional task with max A OT Short Term Goal 4 - Progress (Week 1): Met Week 2:  OT Short Term Goal 1 (Week 2): Pt will complete 2/3 toileting tasks with steadying assist OT Short Term Goal 1 - Progress (Week 2): Met OT Short Term Goal 2 (Week 2): Pt will complete 1 grooming task in standing with min A for steadying in order to increase functional standing balance and endurance OT Short Term Goal 2 - Progress (Week 2): Met OT Short Term Goal 3 (Week 2): Pt will complete stand pivot transfer with hemi walker and mod A in prep for functional tasks.  OT Short Term Goal 3 - Progress (Week 2): Other (comment) (Goal no longer apllicable as focusing on squat pivot transfers at this time) OT Short Term Goal 4 (Week 2): Pt will don shirt with set-up assist  Skilled Therapeutic Interventions/Progress Updates:    1:1 Focus on toilet transfers with and without grab bar with min A with max cuing for feet placement and safety with controlled descents. Sit to stands for clothing management during toileting with min A with total A for toileting. Continued focus on stand pivots v squat pivots  in the hallway with AFO and shoes donned with focus on right LE activation during weight shifts to advance left LE safely in transfer. Pt required max multimodal cues (including manual facilitation). Pt choose to continue to sit up in w/c after session with call bell.   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   Therapy/Group: Individual Therapy  Willeen Cass [address] [facility] 02/21/2017, 3:52 PM

## 2017-02-22 ENCOUNTER — Inpatient Hospital Stay (HOSPITAL_COMMUNITY): Payer: Medicare HMO | Admitting: Occupational Therapy

## 2017-02-22 MED ORDER — LISINOPRIL 10 MG PO TABS
10.0000 mg | ORAL_TABLET | Freq: Every day | ORAL | Status: DC
  Administered 2017-02-25: 10 mg via ORAL
  Filled 2017-02-22 (×4): qty 1

## 2017-02-22 NOTE — Progress Notes (Signed)
Occupational Therapy Session Note  Patient Details  Name: Brandon Howard MRN: [id number] Date of Birth: Oct 05, 1940  Today's Date: 02/22/2017 OT Individual Time: 1102-1202 OT Individual Time Calculation (min): 60 min    Skilled Therapeutic Interventions/Progress Updates:    Tx focus on Rt NMR, Rt attention, endurance, and sitting balance during meaningful tasks.   Pt greeted in w/c with NT present. Had just showered/dressed and ready for tx. With extra time and instruction, pt donned Rt AFO himself (able to tighten straps around calf). He donned footwear with supervision and encouragement, including fastening shoe buttons. He then self propelled down hallway, close supervision for avoiding obstacles on Rt side. When asked what he liked to do, pt verbalizing "love to play golf!" He self propelled down hallway toward supply closet while OT gathered materials, then back to dayroom. He engaged in golf while seated, initially required [facility] for integrating Rt UE, then pt initiating placement of R UE on putter and maintaining bilateral grip. Activity downgraded by shortening length of putting green significantly. However, pt was very engrossed in task, got a couple hole in ones! Active problem solving/attending to Rt when putter got stuck underneath green on Rt side. Dynamic sitting balance while pt was bending forward to retrieve ball and place ball back on green (safely and with min guard from OT). At end of tx pt self propelled back to room and was left with all needs within reach. Country music playing throughout session to enhance psychosocial wellbeing (on his birthday!). He requested for music to continue playing at time of departure.   Interprofessional team sang Happy Birthday to him at lunch before he ate a slice of cake!    Therapy Documentation Precautions:  Precautions Precautions: Fall Restrictions Weight Bearing Restrictions: No Other Position/Activity Restrictions: Dysarthria from  prior stroke with prior R sided weakness     See Function Navigator for Current Functional Status.   Therapy/Group: Individual Therapy  [name] [name] 02/22/2017, 7:38 AM

## 2017-02-22 NOTE — Plan of Care (Signed)
Problem: RH BOWEL ELIMINATION Goal: RH STG MANAGE BOWEL WITH ASSISTANCE STG Manage Bowel with Min Assistance.  Outcome: Not Progressing Patient digs in rectum and smears stool in bathroom and in bed

## 2017-02-22 NOTE — Progress Notes (Signed)
  Subjective/Complaints: Eating, feels well, no complaints   Objective: Vital Signs: Blood pressure (!) 151/82, pulse 82, temperature 98.8 F (37.1 C), temperature source Oral, resp. rate 18, height 5\' 7"  (1.702 m), weight 157 lb 12.8 oz (71.6 kg), SpO2 96 %.  esam unchanged from yesterday)- currently eating.  HEENT: atraumatic, normocephalic cv- regrate, no jvd     Chest- cta abd- soft Neuro: Right facial droop , dysarthric Alert .   Assessment/Plan: 1. 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  Dysarthria and dysphagia as well as decline in mobility and ability to carry out ADL tasks. secondary to bilateral CVA.  Continue CIR  2.  DVT Prophylaxis/Anticoagulation: Pharmaceutical: Lovenox, monitor platelets, platelets were normal on 02/18/2017 3. Pain Management: N/A 4. Mood: LCSW to follow for evaluation and support.  5. Neuropsych: This patient is not fully capable of making decisions on his own behalf. 6. Skin/Wound Care: routine pressure relief measures.  7. Fluids/Electrolytes/Nutrition: Monitor I/Os.   Bmet  Basic Metabolic Panel:    Component Value Date/Time   NA 141 02/18/2017 0407   K 3.9 02/18/2017 0407   CL 105 02/18/2017 0407   CO2 29 02/18/2017 0407   BUN 12 02/18/2017 0407   CREATININE 0.79 02/18/2017 0407   GLUCOSE 97 02/18/2017 0407   CALCIUM 9.0 02/18/2017 0407    8. HTN: 116/71-131/69 controlled. Meds resumed. Monitor for hypotension  in setting of proximal BA stenosis.   Controlled 8/24 Vitals:   02/22/17 0320 02/22/17 0805  BP: 117/62 (!) 151/82  Pulse: 82   Resp: 18   Temp: 98.8 F (37.1 C)   SpO2: 96%   BP may be slightly low given bilateral BA stenosis- decrease lisinopril to 10 mg  Po qd.  9. Hyperlipidemia: On Zocor.  10. Polysubstance abuse:  11. CAD s/p CABG: Asymptomatic. On ASA, Plavix and Zocor.   LOS (Days) 18 A FACE TO FACE EVALUATION WAS PERFORMED  Bruce H  Swords 02/22/2017, 9:28 AM

## 2017-02-22 NOTE — Progress Notes (Signed)
Patient has habit of manually disimpacting himself of stool. Patient has been educated about infection prevention and hand sanitation.

## 2017-02-23 ENCOUNTER — Inpatient Hospital Stay (HOSPITAL_COMMUNITY): Payer: Medicare HMO | Admitting: Occupational Therapy

## 2017-02-23 ENCOUNTER — Inpatient Hospital Stay (HOSPITAL_COMMUNITY): Payer: Medicare HMO | Admitting: Physical Therapy

## 2017-02-23 ENCOUNTER — Inpatient Hospital Stay (HOSPITAL_COMMUNITY): Payer: Medicare HMO | Admitting: Speech Pathology

## 2017-02-23 NOTE — NC FL2 (Signed)
[place] MEDICAID FL2 LEVEL OF CARE SCREENING TOOL     IDENTIFICATION  Patient Name: Brandon Howard Birthdate: December 26, 1940 Sex: male Admission Date (Current Location): 02/04/2017  [facility] and [place] Number:  Producer, television/film/video and Address:  [facility], [address] [place], [place] [address]      Provider Number: [id number]  Attending Physician Name and Address:  Erick Colace, MD  Relative Name and Phone Number:  Jamie-son [phone]    Current Level of Care: Other (Comment) (rehab) Recommended Level of Care: Skilled Nursing Facility Prior Approval Number:    Date Approved/Denied:   PASRR Number: [id number]  Discharge Plan: SNF    Current Diagnoses: Patient Active Problem List   Diagnosis Date Noted  . Gait disturbance, post-stroke 02/06/2017  . Arterial ischemic stroke, vertebrobasilar, brainstem, acute, unspecified laterality (HCC) 02/04/2017  . Hyperlipidemia   . Polysubstance abuse   . 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 Right hemiparesis (HCC)   . Dysphagia, post-stroke   . History of CVA with residual deficit   . Hyperglycemia   . Benign essential HTN   . Dysarthria, post-stroke   . Cocaine abuse   . Stroke (cerebrum) (HCC) 01/31/2017    Orientation RESPIRATION BLADDER Height & Weight     Self, Situation, Place, Time  Normal Continent Weight: 157 lb 12.8 oz (71.6 kg) Height:  5\' 7"  (170.2 cm)  BEHAVIORAL SYMPTOMS/MOOD NEUROLOGICAL BOWEL NUTRITION STATUS      Continent Diet (Dys 3 nectar thick liquids-water protocol)  AMBULATORY STATUS COMMUNICATION OF NEEDS Skin   Extensive Assist Non-Verbally Normal                       Personal Care Assistance Level of Assistance  Bathing, Dressing Bathing Assistance: Limited assistance   Dressing Assistance: Limited assistance     Functional Limitations Info  Speech     Speech Info: Impaired     SPECIAL CARE FACTORS FREQUENCY  PT (By licensed PT), OT (By licensed OT), Speech therapy     PT Frequency: 5x week OT Frequency: 5x week     Speech Therapy Frequency: 5x week      Contractures Contractures Info: Not present    Additional Factors Info  Code Status, Allergies Code Status Info: Full Allergies Info: NKA           Current Medications (02/23/2017):  This is the current hospital active medication list Current Facility-Administered Medications  Medication Dose Route Frequency Provider Last Rate Last Dose  . acetaminophen (TYLENOL) tablet 325-650 mg  325-650 mg Oral Q4H PRN Jacquelynn Cree, PA-C   650 mg at 02/15/17 2001  . allopurinol (ZYLOPRIM) tablet 100 mg  100 mg Oral Daily Jacquelynn Cree, PA-C   100 mg at 02/23/17 0848  . alum & mag hydroxide-simeth (MAALOX/MYLANTA) 200-200-20 MG/5ML suspension 30 mL  30 mL Oral Q4H PRN Love, Pamela S, PA-C      . amLODipine (NORVASC) tablet 10 mg  10 mg Oral Daily Jacquelynn Cree, PA-C   10 mg at 02/22/17 0805  . aspirin chewable tablet 81 mg  81 mg Oral Daily Jacquelynn Cree, PA-C   81 mg at 02/23/17 0848  . bisacodyl (DULCOLAX) suppository 10 mg  10 mg Rectal Daily PRN Love, Pamela S, PA-C      . clopidogrel (PLAVIX) tablet 75 mg  75 mg Oral Daily Love, Evlyn Kanner,  PA-C   75 mg at 02/23/17 0848  . diphenhydrAMINE (BENADRYL) 12.5 MG/5ML elixir 12.5-25 mg  12.5-25 mg Oral Q6H PRN Love, Pamela S, PA-C      . enoxaparin (LOVENOX) injection 40 mg  40 mg Subcutaneous Q24H Love, Pamela S, PA-C   40 mg at 02/22/17 2122  . guaiFENesin-dextromethorphan (ROBITUSSIN DM) 100-10 MG/5ML syrup 5-10 mL  5-10 mL Oral Q6H PRN Love, Pamela S, PA-C      . lisinopril (PRINIVIL,ZESTRIL) tablet 10 mg  10 mg Oral Daily Swords, Bruce H, MD      . polyethylene glycol (MIRALAX / GLYCOLAX) packet 17 g  17 g Oral Daily PRN Love, Pamela S, PA-C      . prochlorperazine (COMPAZINE) tablet 5-10 mg  5-10 mg Oral Q6H PRN Love, Pamela S, PA-C       Or  .  prochlorperazine (COMPAZINE) injection 5-10 mg  5-10 mg Intramuscular Q6H PRN Love, Pamela S, PA-C       Or  . prochlorperazine (COMPAZINE) suppository 12.5 mg  12.5 mg Rectal Q6H PRN Love, Pamela S, PA-C      . simvastatin (ZOCOR) tablet 40 mg  40 mg Oral QPM Love, Pamela S, PA-C   40 mg at 02/22/17 1712  . sodium phosphate (FLEET) 7-19 GM/118ML enema 1 enema  1 enema Rectal Once PRN Love, Pamela S, PA-C      . traZODone (DESYREL) tablet 25-50 mg  25-50 mg Oral QHS PRN Love, Pamela S, PA-C   50 mg at 02/22/17 2122     Discharge Medications: Please see discharge summary for a list of discharge medications.  Relevant Imaging Results:  Relevant Lab Results:   Additional Information [phone]  Lucy Chris, LCSW

## 2017-02-23 NOTE — Progress Notes (Signed)
Occupational Therapy Session Note  Patient Details  Name: Brandon Howard MRN: [id number] Date of Birth: May 04, 1941  Today's Date: 02/23/2017 OT Individual Time: 0321-2248 OT Individual Time Calculation (min): 75 min    Short Term Goals: Week 3:  OT Short Term Goal 1 (Week 3): STG=LTG due to LOS  Skilled Therapeutic Interventions/Progress Updates:    Pt seen for OT session focusing on AL re-training and functiona staning balance/ endurance. Pt in supine upon arrival, able to make need known to have brief changed due to incontinence. He rolled with min A using hospital bed functions for brief to be changed and pt able to complete buttock hygiene from sidelying position. He transtioned to EOB with min A and completed LB dressing seated EOB with steadying assist for dynamic sitting balance and assist to place LEs into figure four position in order to thread pants and don socks.  He completd mod A squat pivot transfer into w/c transfering to weaker R side.  Grooming tasks completed from w/c level at sink, hand over hand assist used to utilize R UE which he was able to use partial range of motion gravity eliminated position to wash L arm. He self propelled w/c to therapy day room with min A. He ate breakfast with supervision. Used R UE with mod A to stabilize items while opening with L hand. He required mod progressing to min cuing for swallowing techniques. Pt with one bout of coughing, following cough, pt more receptive to VCs and following swallowing pre-cautions.  PT returned to room and completed oral care via suction toothbrush with assist for set-up. Pt left seated in w/c at end of session, all needs in reach.   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   Therapy/Group: Individual Therapy  [name], Julisa Flippo C 02/23/2017, 7:07 AM

## 2017-02-23 NOTE — Progress Notes (Signed)
Physical Therapy Session Note  Patient Details  Name: Brandon Howard MRN: [id number] Date of Birth: 03-09-1941  Today's Date: 02/23/2017 PT Individual Time: 1000-1045 and 1415-1445  PT Individual Time Calculation (min): 45 min and 30 min  Short Term Goals: Week 3:  PT Short Term Goal 1 (Week 3): STG =LTG due to Estimated d/c date.   Skilled Therapeutic Interventions/Progress Updates: Tx1: Pt presented in w/c agreeable to therapy. Propelled in w/c approx 272f with supervision and use of hemi technique. Performed squat pivot w/c to NuStep L3 x 8 min with RLE support and R hand splint for endurance. Squat pivot to R return to w/c minA. Return to room to practice pivot transfer to toilet. Performed squat pivot (with pants on) and use of wall rail modA with max cues for sequencing. Upon sitting on toilet pt motions to use toilet (was asked prior if needed to use toilet). Performed sit to stand with wall rail minA with total assist for clothing management (+void and BM). Performed sit to stand with wall rail at toilet to all ow PTA for peri-care and clothing management. Performed stand pivot to L to return to w/c with modA and remained in room with call bell within reach and needs met.   Tx2: Pt presented in w/c with visitors present agreeable to therapy. Pt transported to rehab gym for time management. Gait training at wall x 10 ft with mod/maxA and use of AFO. Required maxA for placement of RLE to improve BOS.  Max cues for wt shift to R and R quad activation. Pt noted to be incontinent of bladder during gait training. Returned to room and performed squat pivot to toilet modA. PTA total assist for LB clothing management (+urinary void on toilet). After toileting pt performed stand pivot to L return to w/c minA with PTA total assist for pulling up pants. Pt performed additional sit to stand from w/c to complete clothing management. Pt able to maintain fair static standing unsupported and hold up shirt  so PTA can button pants. Pt returned to w/c and remained in chair at end of session with call bell within reach and all needs met.      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   Therapy/Group: Individual Therapy  Daven Pinckney  Elayna Tobler, PTA  02/23/2017, 12:23 PM

## 2017-02-23 NOTE — Progress Notes (Signed)
Speech Language Pathology Daily Session Note  Patient Details  Name: Brandon Howard MRN: [id number] Date of Birth: May 17, 1941  Today's Date: 02/23/2017 SLP Individual Time: 0915-1000 SLP Individual Time Calculation (min): 45 min  Short Term Goals: Week 3: SLP Short Term Goal 1 (Week 3): Pt will communicate basic wants and needs via multimodal communication with Max A cues.  SLP Short Term Goal 2 (Week 3): Pt will utilize speech intelligibility strategies to achieve ~ 50% intelligibility at the word level with Max A cues.  SLP Short Term Goal 3 (Week 3): Pt will answer yes/no questions using multimodalities with ~ 50% accuracy and Mod A cues.  SLP Short Term Goal 4 (Week 3): Pt will consume tsp trials of thin water with minimal overt s/s of aspiration and Mod A cues for use of compensatory swallow strategies.    Skilled Therapeutic Interventions: Skilled treatment session focused on dysphagia and speech goals. SLP facilitated session by providing skilled observation of pt consuming thin water via tsp. Pt able to feed himself via tsp. No overt s/s of aspiration. Pt allowed small cup sips to assess for any improvement with swallow function. Pt coughed with each sip. Pt's swallow function doesn't appear improve as he coughed with cup sips on previous instrumental study. SLP further facilitated session by having pt indicate selected picture from picture board. Pt able to select requested picture for ~ 85% of opportunities with question cues. Pt with increased understanding of pictures that were obvious (i.e., glasses) vs. pictures with inferred information (i.e., pt grabbing throat choking). Pt with attempts to label pictures with ~ 45% speech intelligibility at the word level. Picture board left with pt in his room, he gave thumbs up with explanation to use board. Pt left upright in wheelchair with all needs within reach. Continue per current plan of care.      Function:  Eating Eating    Modified Consistency Diet: No (Trials of water protocol) Eating Assist Level: Set up assist for;Supervision or verbal cues   Eating Set Up Assist For: Opening containers       Cognition Comprehension Comprehension assist level: Understands basic 50 - 74% of the time/ requires cueing 25 - 49% of the time;Understands basic 25 - 49% of the time/ requires cueing 50 - 75% of the time  Expression   Expression assist level: Expresses basic 25 - 49% of the time/requires cueing 50 - 75% of the time. Uses single words/gestures.;Expresses basis less than 25% of the time/requires cueing >75% of the time.  Social Interaction Social Interaction assist level: Interacts appropriately less than 25% of the time. May be withdrawn or combative.  Problem Solving Problem solving assist level: Solves basic less than 25% of the time - needs direction nearly all the time or does not effectively solve problems and may need a restraint for safety  Memory Memory assist level: Recognizes or recalls 25 - 49% of the time/requires cueing 50 - 75% of the time    Pain Pain Assessment Pain Assessment: No/denies pain  Therapy/Group: Individual Therapy  Lasalle Abee 02/23/2017, 10:58 AM

## 2017-02-23 NOTE — Progress Notes (Signed)
Subjective/Complaints:  No issues overnite  ROS: Limited due to cognitive/behavioral   Objective: Vital Signs: Blood pressure 134/69, pulse 81, temperature 98.3 F (36.8 C), temperature source Oral, resp. rate 18, height 5\' 7"  (1.702 m), weight 71.6 kg (157 lb 12.8 oz), SpO2 97 %. No results found. No results found for this or any previous visit (from the past 72 hour(s)).   HEENT: Normocephalic, atraumatic Cardio: RRR without murmur. No JVD     Resp: CTA Bilaterally without wheezes or rales. Normal effort  GI: BS positive and ND Skin:   Intact. Warm and dry. Neuro:  Right facial droop , dysarthric Cranial Nerve Abnormalities Right central 7 Severe dysarthria Motor 3- to 3/5, right deltoid, biceps, triceps, grip with spastic tone. Still doesn't initiate use of LUE without cueing. 4+/5 right hip flexor, knee extensor, ankle dorsiflexor and LUE/LLE: 5/5 proximal to distal Musc/Skel:  No edema, no tenderness. Gen. no acute distress.  .   Assessment/Plan: 1. 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 Physiatrist is providing close team supervision and 24 hour management of active medical problems listed below. Physiatrist and rehab team continue to assess barriers to discharge/monitor patient progress toward functional and medical goals. FIM: Function - Bathing Position: Shower Body parts bathed by patient: Chest, Abdomen, Right upper leg, Left upper leg, Left lower leg, Right lower leg, Right arm, Left arm, Front perineal area, Buttocks Body parts bathed by helper: Back Assist Level: Supervision or verbal cues  Function- Upper Body Dressing/Undressing What is the patient wearing?: Pull over shirt/dress Pull over shirt/dress - Perfomed by patient: Thread/unthread right sleeve, Thread/unthread left sleeve, Put head through opening, Pull shirt over  trunk Pull over shirt/dress - Perfomed by helper: Thread/unthread left sleeve, Put head through opening Assist Level: More than reasonable time Function - Lower Body Dressing/Undressing What is the patient wearing?: Pants, Non-skid slipper socks, AFO Position: Wheelchair/chair at sink Pants- Performed by patient: Thread/unthread right pants leg, Thread/unthread left pants leg, Pull pants up/down Pants- Performed by helper: Fasten/unfasten pants Non-skid slipper socks- Performed by patient: Don/doff right sock, Don/doff left sock AFO - Performed by helper: Don/doff right AFO Assist for footwear: Partial/moderate assist Assist for lower body dressing: Touching or steadying assistance (Pt > 75%)  Function - Toileting Toileting steps completed by patient: Adjust clothing prior to toileting, Performs perineal hygiene Toileting steps completed by helper: Adjust clothing prior to toileting, Performs perineal hygiene, Adjust clothing after toileting Toileting Assistive Devices: Grab bar or rail Assist level: More than reasonable time, Touching or steadying assistance (Pt.75%)  Function - Archivist transfer assistive device: Grab bar Assist level to toilet: Moderate assist (Pt 50 - 74%/lift or lower) Assist level from toilet: Moderate assist (Pt 50 - 74%/lift or lower)  Function - Chair/bed transfer Chair/bed transfer method: Squat pivot Chair/bed transfer assist level: Touching or steadying assistance (Pt > 75%) Chair/bed transfer assistive device: Armrests Chair/bed transfer details: Manual facilitation for weight shifting, Manual facilitation for placement, Verbal cues for precautions/safety, Verbal cues for technique  Function - Locomotion: Wheelchair Will patient use wheelchair at discharge?: Yes Type: Manual Max wheelchair distance: 291ft  Assist Level: Supervision or verbal cues Assist Level: Supervision or verbal cues Wheel 150 feet activity did not occur:  Safety/medical concerns (Poor planning limits efficiency) Assist Level: Supervision or verbal cues Turns around,maneuvers to table,bed, and toilet,negotiates 3% grade,maneuvers on rugs and over doorsills: No Function - Locomotion:  Ambulation Assistive device: Walker-rolling Max distance: 25 Assist level: Moderate assist (Pt 50 - 74%) Walk 10 feet activity did not occur: Safety/medical concerns (Dependence) Assist level: Moderate assist (Pt 50 - 74%) Walk 50 feet with 2 turns activity did not occur: Safety/medical concerns Walk 150 feet activity did not occur: Safety/medical concerns Walk 10 feet on uneven surfaces activity did not occur: Safety/medical concerns  Function - Comprehension Comprehension: Auditory Comprehension assist level: Understands basic 50 - 74% of the time/ requires cueing 25 - 49% of the time  Function - Expression Expression: Verbal Expression assist level: Expresses basic 25 - 49% of the time/requires cueing 50 - 75% of the time. Uses single words/gestures.  Function - Social Interaction Social Interaction assist level: Interacts appropriately less than 25% of the time. May be withdrawn or combative.  Function - Problem Solving Problem solving assist level: Solves basic less than 25% of the time - needs direction nearly all the time or does not effectively solve problems and may need a restraint for safety  Function - Memory Memory assist level: Recognizes or recalls 25 - 49% of the time/requires cueing 50 - 75% of the time Patient normally able to recall (first 3 days only): None of the above  Medical Problem List and Plan: 1.  Dysarthria and dysphagia as well as decline in mobility and ability to carry out ADL tasks. secondary to bilateral CVA.  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  DVT Prophylaxis/Anticoagulation: Pharmaceutical: Lovenox, monitor platelets, platelets were  normal on 02/18/2017 3. Pain Management: N/A 4. Mood: LCSW to follow for evaluation and support.  5. Neuropsych: This patient is not fully capable of making decisions on his own behalf. 6. Skin/Wound Care: routine pressure relief measures.  7. Fluids/Electrolytes/Nutrition: Monitor I/Os. Monitor for signs of dehydration.  No change in diet following modified barium swallow  D3 Nectar, 75-90% of meals with good fluid intake  Bmet normal on 08/22 8. HTN: Meds resumed. Monitor for hypotension  in setting of proximal BA stenosis.   Controlled 8/27 Vitals:   02/22/17 1611 02/23/17 0500  BP: 114/68 134/69  Pulse: 82 81  Resp: 17 18  Temp: 98.8 F (37.1 C) 98.3 F (36.8 C)  SpO2: 97% 97%   9. Hyperlipidemia: On Zocor.  10. Polysubstance abuse: Counsel on importance of cessation. 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 On ASA, Plavix and Zocor.   LOS (Days) 19 A FACE TO FACE EVALUATION WAS PERFORMED  KIRSTEINS,ANDREW E 02/23/2017, 8:12 AM

## 2017-02-24 ENCOUNTER — Inpatient Hospital Stay (HOSPITAL_COMMUNITY): Payer: Medicare HMO | Admitting: Speech Pathology

## 2017-02-24 ENCOUNTER — Inpatient Hospital Stay (HOSPITAL_COMMUNITY): Payer: Medicare HMO | Admitting: Physical Therapy

## 2017-02-24 ENCOUNTER — Inpatient Hospital Stay (HOSPITAL_COMMUNITY): Payer: Medicare HMO

## 2017-02-24 ENCOUNTER — Inpatient Hospital Stay (HOSPITAL_COMMUNITY): Payer: Medicare HMO | Admitting: Occupational Therapy

## 2017-02-24 NOTE — Progress Notes (Signed)
Speech Language Pathology Daily Session Note  Patient Details  Name: Brandon Howard MRN: [id number] Date of Birth: [birth year]  Today's Date: 02/24/2017 SLP Individual Time: 0930-1000 SLP Individual Time Calculation (min): 30 min  Short Term Goals: Week 3: SLP Short Term Goal 1 (Week 3): Pt will communicate basic wants and needs via multimodal communication with Max A cues.  SLP Short Term Goal 2 (Week 3): Pt will utilize speech intelligibility strategies to achieve ~ 50% intelligibility at the word level with Max A cues.  SLP Short Term Goal 3 (Week 3): Pt will answer yes/no questions using multimodalities with ~ 50% accuracy and Mod A cues.  SLP Short Term Goal 4 (Week 3): Pt will consume tsp trials of thin water with minimal overt s/s of aspiration and Mod A cues for use of compensatory swallow strategies.    Skilled Therapeutic Interventions: Skilled ST services focused on speech intelligibility and dysphagia goals. Pt initial refused water protocol when SLP offered, SLP educated pt on recent MBS results requiring nectar thick liquid due to aspiration risk. Pt communicated he was "ok" drinking nectar thicken, however would love soda, SLP educated pt on the change in consistency from nectar thick to soda, and pt agreed to participating in water protocol after education. SLP facilitated oral cleaning providing only assistance for set up. Pt consumed tsp of water x 10 with no overt s/s aspiration and 2 cup sips with immediate severe cough, suggesting continued impairment with oral control given larger amounts of thin liquids. Pt demonstrated 75% intelligibility in conversation utilizing Max verbal, phonemic and question cues. SLP educated in ways to increase intelligibility. Pt was left in room with cal bell within reach. Recommend to continue ST services.     Function:  Eating Eating   Modified Consistency Diet: Yes Eating Assist Level: Set up assist for;Supervision or verbal cues    Eating Set Up Assist For: Opening containers;Cutting food Helper Scoops Food on Utensil: Occasionally     Cognition Comprehension Comprehension assist level: Understands basic 50 - 74% of the time/ requires cueing 25 - 49% of the time;Understands basic 25 - 49% of the time/ requires cueing 50 - 75% of the time  Expression   Expression assist level: Expresses basic 25 - 49% of the time/requires cueing 50 - 75% of the time. Uses single words/gestures.;Expresses basis less than 25% of the time/requires cueing >75% of the time.  Social Interaction Social Interaction assist level: Interacts appropriately less than 25% of the time. May be withdrawn or combative.  Problem Solving Problem solving assist level: Solves basic less than 25% of the time - needs direction nearly all the time or does not effectively solve problems and may need a restraint for safety  Memory Memory assist level: Recognizes or recalls 25 - 49% of the time/requires cueing 50 - 75% of the time    Pain Pain Assessment Pain Assessment: No/denies pain  Therapy/Group: Individual Therapy  [place] Record  [facility] 02/24/2017, 12:16 PM

## 2017-02-24 NOTE — Progress Notes (Signed)
Occupational Therapy Session Note  Patient Details  Name: Brandon Howard MRN: [id number] Date of Birth: 19-May-1941  Today's Date: 02/24/2017 OT Individual Time: 2505-3976 OT Individual Time Calculation (min): 75 min    Short Term Goals: Week 3:  OT Short Term Goal 1 (Week 3): STG=LTG due to LOS  Skilled Therapeutic Interventions/Progress Updates:    Pt seen for OT ADL bathing/dressing session. Pt sitting up in bed upon arrival with NT present porviding supervision for meal. Pt agreeable to tx session. He transferred to EOB with min A to advance hips and using hospital bed functions. He finished breakfast seated EOB with min cuing for smaller bites, pt becoming easily annoyed by therapist's VCs for safe swallowing techniques per SLP orders and education provided regarding importance with safety for meals.  He transferred via stand pivot to w/c with mod A, difficultyexecuting squat pivot to weaker R side. Same method used for transfer into shower with min A with use of grab bar. Pt bathed at shower level seated on 3-1 BSC. With min A, able to use R UE at gross assist stabilzier level to hold soap container while opening cap with L hand. PT voiced increased pain in R shouldr with shoulder flexion > ~80 degrees when attempting to wash arm pit. Assist provided for thorough hygiene under B arm pits, pt able to problem solve method for washing remainder of arm. He completed min A squat pivot transfer to L out of shower. He dressed seated in w/c. Dressing UB with set-up assist and significantly increased time with use of hemi technique. He stooda t sink with Min A, able to grasp to sink ledge with R to assist with balance. Static standing balance with one UE support with close supervision while therapist applied barrier cream and new brief.  Min steadying assist while pt pulled up pants. Therapist donned TED hose of easier ease of applying AFO, pt able to don L shoe. Grooming tasks completed with assist  for set-up from w/c level. Pt left seated in w/c at end of session, all needs in reach and RN present adminstering morning meds.    Therapy Documentation Precautions:  Precautions Precautions: Fall Restrictions Weight Bearing Restrictions: No Other Position/Activity Restrictions: Dysarthria from prior stroke with prior R sided weakness Pain:    See Function Navigator for Current Functional Status.   Therapy/Group: Individual Therapy  [name], Niyam Bisping C 02/24/2017, 6:55 AM

## 2017-02-24 NOTE — Progress Notes (Signed)
Physical Therapy Session Note  Patient Details  Name: Brandon Howard MRN: [id number] Date of Birth: February 22, 1941  Today's Date: 02/24/2017 PT Individual Time: 1015-1130 PT Individual Time Calculation (min): 75 min   Short Term Goals: Week 3:  PT Short Term Goal 1 (Week 3): STG =LTG due to Estimated d/c date.   Skilled Therapeutic Interventions/Progress Updates: Pt presented in w/c agreeable to therapy. Session focusing on functional transfers, gait, and coordination. Pt propelled supervision to rehab gym with Iintermittnet use of BLE. Gait training with HW 45ft x 2 with minA for balance, mod/maxA for RLE placement. Pt continues to demonstrate significant R valgus. Pt performed R NMR forced use via standing toe touch to target with pt hitting accurate target <50% of trials. While performing pt noted to be incontinent of bowl. Pt transported to room and performed squat pivot to toilet modA to complete bowl evacuation. Pt able to perform self peri-hygiene in sitting at toilet. Performed stand pivot with use of wall rail modA to return to w/c. After hand hygiene pt returned to rehab gym,  PT and PTA assisted in re-strapping AFO ankle strap noted to be loose. Pt performed backwards propulsion of w/c in hall x 30 ft RLE only with manual facilitation of R quad with increased quad activation noted with distance. Pt then handed off to SLP for further therapies.      Therapy Documentation Precautions:  Precautions Precautions: Fall Restrictions Weight Bearing Restrictions: No Other Position/Activity Restrictions: Dysarthria from prior stroke with prior R sided weakness General:   Vital Signs: Therapy Vitals Temp: 98.3 F (36.8 C) Temp Source: Oral Pulse Rate: 75 Resp: 18 BP: 122/63 Patient Position (if appropriate): Sitting Oxygen Therapy SpO2: 99 % O2 Device: Not Delivered Pain: Pain Assessment Pain Assessment: No/denies pain  See Function Navigator for Current Functional  Status.   Therapy/Group: Individual Therapy  Shubh Chiara  Sriman Tally, PTA  02/24/2017, 4:02 PM

## 2017-02-24 NOTE — Progress Notes (Signed)
Speech Language Pathology Daily Session Note  Patient Details  Name: Brandon Howard MRN: [id number] Date of Birth: 03/15/1941  Today's Date: 02/24/2017 SLP Individual Time: 1130-1200 SLP Individual Time Calculation (min): 30 min  Short Term Goals: Week 3: SLP Short Term Goal 1 (Week 3): Pt will communicate basic wants and needs via multimodal communication with Max A cues.  SLP Short Term Goal 2 (Week 3): Pt will utilize speech intelligibility strategies to achieve ~ 50% intelligibility at the word level with Max A cues.  SLP Short Term Goal 3 (Week 3): Pt will answer yes/no questions using multimodalities with ~ 50% accuracy and Mod A cues.  SLP Short Term Goal 4 (Week 3): Pt will consume tsp trials of thin water with minimal overt s/s of aspiration and Mod A cues for use of compensatory swallow strategies.    Skilled Therapeutic Interventions:  Pt was seen for skilled ST targeting communication goals.  SLP facilitated the session with a structured generative naming task to address functional communication goals.  Pt needed min assist semantic cues to generate the names of specific category members and mod-max assist verbal cues for slow rate and overarticulation to achieve intelligibility at the word level.  Pt was left in wheelchair with quick release belt donned and call bell within reach.  Continue per current plan of care.    Function:  Eating Eating   Modified Consistency Diet: Yes Eating Assist Level: Set up assist for;Supervision or verbal cues   Eating Set Up Assist For: Opening containers;Cutting food Helper Scoops Food on Utensil: Occasionally     Cognition Comprehension Comprehension assist level: Understands basic 75 - 89% of the time/ requires cueing 10 - 24% of the time  Expression   Expression assist level: Expresses basic 25 - 49% of the time/requires cueing 50 - 75% of the time. Uses single words/gestures.  Social Interaction Social Interaction assist level:  Interacts appropriately 75 - 89% of the time - Needs redirection for appropriate language or to initiate interaction.  Problem Solving Problem solving assist level: Solves basic 50 - 74% of the time/requires cueing 25 - 49% of the time  Memory Memory assist level: Recognizes or recalls 25 - 49% of the time/requires cueing 50 - 75% of the time    Pain Pain Assessment Pain Assessment: No/denies pain  Therapy/Group: Individual Therapy  Georgianne Gritz, Melanee Spry 02/24/2017, 1:37 PM

## 2017-02-24 NOTE — Progress Notes (Signed)
Subjective/Complaints:  Seen during OT, pt has not been receptive to cues to slow down during meals.  Places excess food in mouth and takes sips of fluid with a full mouth  ROS: Limited due to cognitive/behavioral   Objective: Vital Signs: Blood pressure (!) 124/58, pulse 79, temperature 97.8 F (36.6 C), temperature source Oral, resp. rate 18, height 5\' 7"  (1.702 m), weight 71.6 kg (157 lb 12.8 oz), SpO2 97 %. No results found. No results found for this or any previous visit (from the past 72 hour(s)).   HEENT: Normocephalic, atraumatic Cardio: RRR without murmur. No JVD     Resp: CTA Bilaterally without wheezes or rales. Normal effort  GI: BS positive and ND Skin:   Intact. Warm and dry. Neuro:  Right facial droop , dysarthric Cranial Nerve Abnormalities Right central 7 Severe dysarthria Motor 3- to 3/5, right deltoid, biceps, triceps, grip with spastic tone. Still doesn't initiate use of LUE without cueing. 4+/5 right hip flexor, knee extensor, ankle dorsiflexor and LUE/LLE: 5/5 proximal to distal Musc/Skel:  No edema, no tenderness. Gen. no acute distress.  .   Assessment/Plan: 1. 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 Physiatrist is providing close team supervision and 24 hour management of active medical problems listed below. Physiatrist and rehab team continue to assess barriers to discharge/monitor patient progress toward functional and medical goals. FIM: Function - Bathing Position: Shower Body parts bathed by patient: Chest, Abdomen, Right upper leg, Left upper leg, Left lower leg, Right lower leg, Right arm, Left arm, Front perineal area, Buttocks Body parts bathed by helper: Back Assist Level: Supervision or verbal cues  Function- Upper Body Dressing/Undressing What is the patient wearing?: Pull over shirt/dress Pull over  shirt/dress - Perfomed by patient: Thread/unthread right sleeve, Thread/unthread left sleeve, Put head through opening, Pull shirt over trunk Pull over shirt/dress - Perfomed by helper: Thread/unthread left sleeve, Put head through opening Assist Level: More than reasonable time Function - Lower Body Dressing/Undressing What is the patient wearing?: Pants, Non-skid slipper socks, AFO, Shoes Position: Sitting EOB Pants- Performed by patient: Thread/unthread right pants leg, Thread/unthread left pants leg, Pull pants up/down Pants- Performed by helper: Fasten/unfasten pants Non-skid slipper socks- Performed by patient: Don/doff right sock, Don/doff left sock Shoes - Performed by patient: Don/doff left shoe Shoes - Performed by helper: Don/doff right shoe, Fasten right, Fasten left AFO - Performed by helper: Don/doff right AFO Assist for footwear: Partial/moderate assist Assist for lower body dressing: Touching or steadying assistance (Pt > 75%)  Function - Toileting Toileting steps completed by patient: Adjust clothing prior to toileting, Performs perineal hygiene Toileting steps completed by helper: Adjust clothing prior to toileting, Performs perineal hygiene, Adjust clothing after toileting Toileting Assistive Devices: Grab bar or rail Assist level: More than reasonable time, Touching or steadying assistance (Pt.75%)  Function - Archivist transfer assistive device: Grab bar Assist level to toilet: Moderate assist (Pt 50 - 74%/lift or lower) Assist level from toilet: Moderate assist (Pt 50 - 74%/lift or lower)  Function - Chair/bed transfer Chair/bed transfer method: Squat pivot Chair/bed transfer assist level: Touching or steadying assistance (Pt > 75%) Chair/bed transfer assistive device: Armrests Chair/bed transfer details: Manual facilitation for weight shifting, Manual facilitation for placement, Verbal cues for precautions/safety, Verbal cues for  technique  Function - Locomotion: Wheelchair Will patient use wheelchair at discharge?: Yes Type: Manual Max wheelchair distance: 258ft  Assist Level: Supervision or verbal cues Assist Level: Supervision or verbal cues Wheel 150 feet activity did not occur: Safety/medical concerns (Poor planning limits efficiency) Assist Level: Supervision or verbal cues Turns around,maneuvers to table,bed, and toilet,negotiates 3% grade,maneuvers on rugs and over doorsills: No Function - Locomotion: Ambulation Assistive device: Walker-rolling Max distance: 25 Assist level: Moderate assist (Pt 50 - 74%) Walk 10 feet activity did not occur: Safety/medical concerns (Dependence) Assist level: Moderate assist (Pt 50 - 74%) Walk 50 feet with 2 turns activity did not occur: Safety/medical concerns Walk 150 feet activity did not occur: Safety/medical concerns Walk 10 feet on uneven surfaces activity did not occur: Safety/medical concerns  Function - Comprehension Comprehension: Auditory Comprehension assist level: Understands basic 50 - 74% of the time/ requires cueing 25 - 49% of the time, Understands basic 25 - 49% of the time/ requires cueing 50 - 75% of the time  Function - Expression Expression: Verbal Expression assist level: Expresses basic 25 - 49% of the time/requires cueing 50 - 75% of the time. Uses single words/gestures., Expresses basis less than 25% of the time/requires cueing >75% of the time.  Function - Social Interaction Social Interaction assist level: Interacts appropriately less than 25% of the time. May be withdrawn or combative.  Function - Problem Solving Problem solving assist level: Solves basic less than 25% of the time - needs direction nearly all the time or does not effectively solve problems and may need a restraint for safety  Function - Memory Memory assist level: Recognizes or recalls 25 - 49% of the time/requires cueing 50 - 75% of the time Patient normally able to  recall (first 3 days only): None of the above  Medical Problem List and Plan: 1.  Dysarthria and dysphagia as well as decline in mobility and ability to carry out ADL tasks. secondary to bilateral CVA.  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  DVT Prophylaxis/Anticoagulation: Pharmaceutical: Lovenox, monitor platelets, platelets were normal on 02/18/2017 3. Pain Management: N/A 4. Mood: LCSW to follow for evaluation and support.  5. Neuropsych: This patient is not fully capable of making decisions on his own behalf. 6. Skin/Wound Care: routine pressure relief measures.  7. Fluids/Electrolytes/Nutrition: Monitor I/Os. Monitor for signs of dehydration.  No change in diet following modified barium swallow  D3 Nectar, 75-90% of meals with good fluid intake  Bmet normal on 08/22 8. HTN: Meds resumed. Monitor for hypotension  in setting of proximal BA stenosis.   Controlled 8/28 Vitals:   02/23/17 1330 02/24/17 0500  BP: 105/60 (!) 124/58  Pulse: 82 79  Resp: 18 18  Temp: 98.5 F (36.9 C) 97.8 F (36.6 C)  SpO2: 99% 97%   9. Hyperlipidemia: On Zocor.  10. Polysubstance abuse: Counsel on importance of cessation. 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 On ASA, Plavix and Zocor.   LOS (Days) 20 A FACE TO FACE EVALUATION WAS PERFORMED  Kortney Potvin E 02/24/2017, 8:01 AM

## 2017-02-25 ENCOUNTER — Inpatient Hospital Stay (HOSPITAL_COMMUNITY): Payer: Medicare HMO | Admitting: Occupational Therapy

## 2017-02-25 ENCOUNTER — Inpatient Hospital Stay (HOSPITAL_COMMUNITY): Payer: Medicare HMO

## 2017-02-25 ENCOUNTER — Inpatient Hospital Stay (HOSPITAL_COMMUNITY): Payer: Medicare HMO | Admitting: Physical Therapy

## 2017-02-25 LAB — BASIC METABOLIC PANEL
ANION GAP: 7 (ref 5–15)
BUN: 11 mg/dL (ref 6–20)
CHLORIDE: 104 mmol/L (ref 101–111)
CO2: 30 mmol/L (ref 22–32)
Calcium: 8.6 mg/dL — ABNORMAL LOW (ref 8.9–10.3)
Creatinine, Ser: 0.8 mg/dL (ref 0.61–1.24)
Glucose, Bld: 96 mg/dL (ref 65–99)
POTASSIUM: 3.8 mmol/L (ref 3.5–5.1)
SODIUM: 141 mmol/L (ref 135–145)

## 2017-02-25 LAB — CBC
HEMATOCRIT: 41.2 % (ref 39.0–52.0)
Hemoglobin: 13.6 g/dL (ref 13.0–17.0)
MCH: 30.6 pg (ref 26.0–34.0)
MCHC: 33 g/dL (ref 30.0–36.0)
MCV: 92.6 fL (ref 78.0–100.0)
Platelets: 235 10*3/uL (ref 150–400)
RBC: 4.45 MIL/uL (ref 4.22–5.81)
RDW: 12.8 % (ref 11.5–15.5)
WBC: 6.2 10*3/uL (ref 4.0–10.5)

## 2017-02-25 MED ORDER — ACETAMINOPHEN 325 MG PO TABS: 325.0000 mg | ORAL_TABLET | ORAL | Status: AC | PRN

## 2017-02-25 MED ORDER — SENNOSIDES-DOCUSATE SODIUM 8.6-50 MG PO TABS
2.0000 | ORAL_TABLET | Freq: Every day | ORAL | Status: DC
  Administered 2017-02-25: 2 via ORAL
  Filled 2017-02-25: qty 2

## 2017-02-25 MED ORDER — LISINOPRIL 10 MG PO TABS: 10.0000 mg | ORAL_TABLET | Freq: Every day | ORAL | Status: AC

## 2017-02-25 MED ORDER — TRAZODONE HCL 50 MG PO TABS: 25.0000 mg | ORAL_TABLET | Freq: Every evening | ORAL | Status: AC | PRN

## 2017-02-25 MED ORDER — SENNOSIDES-DOCUSATE SODIUM 8.6-50 MG PO TABS: 2.0000 | ORAL_TABLET | Freq: Every day | ORAL | Status: AC

## 2017-02-25 NOTE — Progress Notes (Signed)
Subjective/Complaints:  No issues overnite, wheeling himself to PT with supervision, remains severely dysarthric  ROS: Limited due to cognitive/behavioral   Objective: Vital Signs: Blood pressure 124/86, pulse 78, temperature 97.9 F (36.6 C), temperature source Oral, resp. rate 16, height '5\' 7"'$  (1.702 m), weight 72 kg (158 lb 10.9 oz), SpO2 99 %. No results found. Results for orders placed or performed during the hospital encounter of 02/04/17 (from the past 72 hour(s))  Basic metabolic panel     Status: Abnormal   Collection Time: 02/25/17  4:16 AM  Result Value Ref Range   Sodium 141 135 - 145 mmol/L   Potassium 3.8 3.5 - 5.1 mmol/L   Chloride 104 101 - 111 mmol/L   CO2 30 22 - 32 mmol/L   Glucose, Bld 96 65 - 99 mg/dL   BUN 11 6 - 20 mg/dL   Creatinine, Ser 0.80 0.61 - 1.24 mg/dL   Calcium 8.6 (L) 8.9 - 10.3 mg/dL   GFR calc non Af Amer >60 >60 mL/min   GFR calc Af Amer >60 >60 mL/min    Comment: (NOTE) The eGFR has been calculated using the CKD EPI equation. This calculation has not been validated in all clinical situations. eGFR's persistently <60 mL/min signify possible Chronic Kidney Disease.    Anion gap 7 5 - 15  CBC     Status: None   Collection Time: 02/25/17  4:16 AM  Result Value Ref Range   WBC 6.2 4.0 - 10.5 K/uL   RBC 4.45 4.22 - 5.81 MIL/uL   Hemoglobin 13.6 13.0 - 17.0 g/dL   HCT 41.2 39.0 - 52.0 %   MCV 92.6 78.0 - 100.0 fL   MCH 30.6 26.0 - 34.0 pg   MCHC 33.0 30.0 - 36.0 g/dL   RDW 12.8 11.5 - 15.5 %   Platelets 235 150 - 400 K/uL     HEENT: Normocephalic, atraumatic Cardio: RRR without murmur. No JVD     Resp: CTA Bilaterally without wheezes or rales. Normal effort  GI: BS positive and ND Skin:   Intact. Warm and dry. Neuro:  Right facial droop , dysarthric Cranial Nerve Abnormalities Right central 7 Severe dysarthria Motor 3- to 3/5, right deltoid, biceps, triceps, grip with spastic tone. Still doesn't initiate use of LUE without  cueing. 4+/5 right hip flexor, knee extensor, ankle dorsiflexor and LUE/LLE: 5/5 proximal to distal Musc/Skel:  No edema, no tenderness. Gen. no acute distress.  .   Assessment/Plan: 1. 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 Physiatrist is providing close team supervision and 24 hour management of active medical problems listed below. Physiatrist and rehab team continue to assess barriers to discharge/monitor patient progress toward functional and medical goals. FIM: Function - Bathing Position: Shower Body parts bathed by patient: Chest, Abdomen, Right upper leg, Left upper leg, Left lower leg, Right lower leg, Right arm, Left arm, Front perineal area, Buttocks Body parts bathed by helper: Back Assist Level: Supervision or verbal cues  Function- Upper Body Dressing/Undressing What is the patient wearing?: Pull over shirt/dress Pull over shirt/dress - Perfomed by patient: Thread/unthread right sleeve, Thread/unthread left sleeve, Put head through opening, Pull shirt over trunk Pull over shirt/dress - Perfomed by helper: Thread/unthread left sleeve, Put head through opening Assist Level: More than reasonable time Function - Lower Body Dressing/Undressing What is the patient wearing?: Pants, AFO, Shoes, Ted Hose Position: Wheelchair/chair at sink  Pants- Performed by patient: Thread/unthread right pants leg, Thread/unthread left pants leg, Pull pants up/down Pants- Performed by helper: Fasten/unfasten pants Non-skid slipper socks- Performed by patient: Don/doff right sock, Don/doff left sock Shoes - Performed by patient: Don/doff left shoe Shoes - Performed by helper: Don/doff right shoe, Fasten right, Fasten left AFO - Performed by helper: Don/doff right AFO TED Hose - Performed by helper: Don/doff right TED hose, Don/doff left TED hose Assist for footwear:  Partial/moderate assist Assist for lower body dressing: Touching or steadying assistance (Pt > 75%)  Function - Toileting Toileting steps completed by patient: Adjust clothing prior to toileting, Performs perineal hygiene Toileting steps completed by helper: Adjust clothing prior to toileting, Performs perineal hygiene, Adjust clothing after toileting Toileting Assistive Devices: Grab bar or rail Assist level: Two helpers  Function - Air cabin crew transfer assistive device: Grab bar Assist level to toilet: 2 helpers Assist level from toilet: 2 helpers  Function - Chair/bed transfer Chair/bed transfer method: Squat pivot Chair/bed transfer assist level: Touching or steadying assistance (Pt > 75%) Chair/bed transfer assistive device: Armrests Chair/bed transfer details: Manual facilitation for weight shifting, Manual facilitation for placement, Verbal cues for precautions/safety, Verbal cues for technique  Function - Locomotion: Wheelchair Will patient use wheelchair at discharge?: Yes Type: Manual Max wheelchair distance: 2104f  Assist Level: Supervision or verbal cues Assist Level: Supervision or verbal cues Wheel 150 feet activity did not occur: Safety/medical concerns (Poor planning limits efficiency) Assist Level: Supervision or verbal cues Turns around,maneuvers to table,bed, and toilet,negotiates 3% grade,maneuvers on rugs and over doorsills: No Function - Locomotion: Ambulation Assistive device: Walker-rolling Max distance: 25 Assist level: Moderate assist (Pt 50 - 74%) Walk 10 feet activity did not occur: Safety/medical concerns (Dependence) Assist level: Moderate assist (Pt 50 - 74%) Walk 50 feet with 2 turns activity did not occur: Safety/medical concerns Walk 150 feet activity did not occur: Safety/medical concerns Walk 10 feet on uneven surfaces activity did not occur: Safety/medical concerns  Function - Comprehension Comprehension: Auditory Comprehension  assist level: Understands basic 75 - 89% of the time/ requires cueing 10 - 24% of the time  Function - Expression Expression: Verbal Expression assist level: Expresses basic 25 - 49% of the time/requires cueing 50 - 75% of the time. Uses single words/gestures.  Function - Social Interaction Social Interaction assist level: Interacts appropriately 75 - 89% of the time - Needs redirection for appropriate language or to initiate interaction.  Function - Problem Solving Problem solving assist level: Solves basic 50 - 74% of the time/requires cueing 25 - 49% of the time  Function - Memory Memory assist level: Recognizes or recalls 25 - 49% of the time/requires cueing 50 - 75% of the time Patient normally able to recall (first 3 days only): None of the above  Medical Problem List and Plan: 1.  Dysarthria and dysphagia as well as decline in mobility and ability to carry out ADL tasks. secondary to bilateral CVA.  Continue CIR therapies, PT, OT, SLP, received new AFO , Team conference today please see physician documentation under team conference tab, met with team face-to-face to discuss problems,progress, and goals. Formulized individual treatment plan based on medical history, underlying problem and comorbidities. 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  DVT Prophylaxis/Anticoagulation: Pharmaceutical: Lovenox, monitor platelets, platelets were normal on 02/18/2017 3. Pain Management: N/A 4. Mood: LCSW to follow for evaluation and support.  5. Neuropsych: This patient is not fully capable of making  decisions on his own behalf. 6. Skin/Wound Care: routine pressure relief measures.  7. Fluids/Electrolytes/Nutrition: Monitor I/Os. Monitor for signs of dehydration.  No change in diet following modified barium swallow  D3 Nectar, 75-90% of meals with good fluid intake  Bmet normal on 08/22 8. HTN: Meds resumed. Monitor for hypotension  in setting of proximal BA stenosis.    Controlled 8/29 Vitals:   02/24/17 1356 02/25/17 0539  BP: 122/63 124/86  Pulse: 75 78  Resp: 18 16  Temp: 98.3 F (36.8 C) 97.9 F (36.6 C)  SpO2: 99% 99%   9. Hyperlipidemia: On Zocor.  10. Polysubstance abuse: Counsel on importance of cessation. 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 On ASA, Plavix and Zocor.   LOS (Days) 21 A FACE TO FACE EVALUATION WAS PERFORMED  Tayah Idrovo E 02/25/2017, 8:47 AM

## 2017-02-25 NOTE — Patient Care Conference (Signed)
Inpatient RehabilitationTeam Conference and Plan of Care Update Date: 02/25/2017   Time: 10:30 AM    Patient Name: Brandon Howard      Medical Record Number: [id number]  Date of Birth: August 26, 1940 Sex: Male         Room/Bed: 4W22C/4W22C-01 Payor Info: Payor: [employer] MEDICARE / Plan: [employer] MEDICARE HMO / Product Type: *No Product type* /    Admitting Diagnosis: Bilat CVAs  Admit Date/Time:  02/04/2017  5:16 PM Admission Comments: No comment available   Primary Diagnosis:  Gait disturbance, post-stroke Principal Problem: Gait disturbance, post-stroke  Patient Active Problem List   Diagnosis Date Noted  . Gait disturbance, post-stroke 02/06/2017  . Arterial ischemic stroke, vertebrobasilar, brainstem, acute, unspecified laterality ([place]) 02/04/2017  . Hyperlipidemia   . Polysubstance abuse   . 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 Dysphagia, post-stroke   . History of CVA with residual deficit   . Hyperglycemia   . Benign essential HTN   . Dysarthria, post-stroke   . Cocaine abuse   . Stroke (cerebrum) ([place]) 01/31/2017    Expected Discharge Date: Expected Discharge Date: 02/27/17  Team Members Present: Physician leading conference: Dr. Alysia Penna Social Worker Present: Ovidio Kin, LCSW Nurse Present: Rayetta Pigg, RN PT Present: Phylliss Bob, PTA;Barrie Folk, PT OT Present: Napoleon Form, OT SLP Present: Charolett Bumpers, SLP PPS Coordinator present : Daiva Nakayama, RN, CRRN     Current Status/Progress Goal Weekly Team Focus  Medical   severe dysarthria, remains incont, some increased mobility but little RUE fxn  increase ability to signal need for bathroom  find SNF   Bowel/Bladder   incontinent of bowel and bladder LBM 02-24-17  continent of bowel and bladder, maintain regular bowel pattern  timed toileting, assist with toileting needs prn assess bowel pattern q shift     Swallow/Nutrition/ Hydration   coughing with thin liquids  Supervision with least restrictive diet  MBS revealed continued issues remian in current diet and continue water protocol   ADL's   Set-up UB dressing, min A UB bathing, Min A LB dressing (excluding shoes and AFO), min-mod A functional transfers  min A overall  Standing balance/ endurance, ADL re-training, neuro re-ed, functional transfers   Mobility   minA standing balance, modA gait with HW, supervision w/c propulsion, minA squat pivot transfers  min assist transfers, gait (max), w/c (min)  standing balance, transfers, R NMR, d/c planning    Communication   60% intelligibilty at word level - met  Mod A for basic words with Max cues- Met  use of compensatory speech strategies   Safety/Cognition/ Behavioral Observations  basic problem solving   Mod A to Max A  basic problem solving    Pain   no c/o pain   no c/o pain  Assess pain q shift and prn   Skin   bruising to hands  skin free of infection/breakdown  assess skin q shift and prn      *See Care Plan and progress notes for long and short-term goals.     Barriers to Discharge  Current Status/Progress Possible Resolutions Date Resolved   Physician    Medical stability;Inaccessible home environment;Decreased caregiver support     slow progress  SNF      Nursing  Lack of/limited family support               PT  OT                  SLP                SW                Discharge Planning/Teaching Needs:  Looking for NH bed, family not able to provide 24 hr physical care at DC. Pt agreeable to this plan.      Team Discussion:  Progressing toward his goals of min-mod level of assist. Medically stable for transfer once NH bed is found. Cough at times with meals-Speech aware. Can communicate at times with one word level. Continent during the day not as much at night.  Revisions to Treatment Plan:  NHP    Continued Need for Acute Rehabilitation  Level of Care: The patient requires daily medical management by a physician with specialized training in physical medicine and rehabilitation for the following conditions: Daily direction of a multidisciplinary physical rehabilitation program to ensure safe treatment while eliciting the highest outcome that is of practical value to the patient.: Yes Daily medical management of patient stability for increased activity during participation in an intensive rehabilitation regime.: Yes Daily analysis of laboratory values and/or radiology reports with any subsequent need for medication adjustment of medical intervention for : Neurological problems;Nutritional problems  Lasonja Lakins, Gardiner Rhyme 02/25/2017, 12:34 PM

## 2017-02-25 NOTE — Progress Notes (Signed)
Occupational Therapy Session Note  Patient Details  Name: Brandon Howard MRN: [id number] Date of Birth: 1940-12-24  Today's Date: 02/25/2017 OT Individual Time: [id number] and 1000-1030 OT Individual Time Calculation (min): 40 min and 30 min   Short Term Goals: Week 3:  OT Short Term Goal 1 (Week 3): STG=LTG due to LOS  Skilled Therapeutic Interventions/Progress Updates:    Session One: Pt seen for OT ADL session. Pt in supine upon arrival, agreeable to tx session.  He transferred from flat bed without use of bed rails in simulation of home environment with min A to obtain sidelying position and VCs for etechnique. Mod assist stand/squat pivot completed into w/c. He declined bathing this morning, opting just to change clothes. Used hemi technique with  Increased time to dress UB, standing at hemi walker with min A to pull pants up, pt demosntrating much improved functional standing balance compared to previous sessions. From w/c level, he gathered clothing items in prep for laundry task and self propelled w/c to pt laundry facility. Completed laundry task from w/c position with assist to manage buttons. Pt returned to room in w/c and left set-up with breakfast tray and hand off to NT for supervision with meal.   Session Two: Pt seen for OT session focusing on functional w/c mobility and IADL re-training. Pt sitting up in w/c upon arrival, agreeable to tx session. He self propelled w/c to pt laundry facility using primarily L LE and with VCs able to use R LE to assist though requires increased time for initiation and fatigues quickly. Removed clothes from dryer and folded items, encouragement to use R UE, however, pt declining as he thinks "it doesn't do anything". Returned to room in same manner as described above. Complete oral hygiene with use of suction brush.  Pt left seated in w/c at end of session, all needs in reach.   Therapy Documentation Precautions:  Precautions Precautions:  Fall Restrictions Weight Bearing Restrictions: No Other Position/Activity Restrictions: Dysarthria from prior stroke with prior R sided weakness Pain:   No/ denies pain  See Function Navigator for Current Functional Status.   Therapy/Group: Individual Therapy  [name], Tiki Tucciarone C 02/25/2017, 7:08 AM

## 2017-02-25 NOTE — Progress Notes (Addendum)
Speech Language Pathology Daily Session Note  Patient Details  Name: Brandon Howard MRN: [id number] Date of Birth: 10/04/1940  Today's Date: 02/25/2017 SLP Individual Time: 1300-1330 SLP Individual Time Calculation (min): 30 min  Short Term Goals: Week 3: SLP Short Term Goal 1 (Week 3): Pt will communicate basic wants and needs via multimodal communication with Max A cues.  SLP Short Term Goal 2 (Week 3): Pt will utilize speech intelligibility strategies to achieve ~ 50% intelligibility at the word level with Max A cues.  SLP Short Term Goal 3 (Week 3): Pt will answer yes/no questions using multimodalities with ~ 50% accuracy and Mod A cues.  SLP Short Term Goal 4 (Week 3): Pt will consume tsp trials of thin water with minimal overt s/s of aspiration and Mod A cues for use of compensatory swallow strategies.    Skilled Therapeutic Interventions:  #1 : Skilled ST services focused on speech intelligibility and language goals. SLP facilitated speech intelligibility skills given novel picture cards to name 1 word items, pt demonstrated 70% intelligibility at word level given a familiar listener and visual context and max cues to utilize strategies. Pt demonstrated ability to respond to basic yes/no questions with 70% accuracy given max verbal and visual cues for clarification. Pt was left in room in chair with call bell within reach. Recommend to continue ST services.     #2 : Skilled ST services focused on speech intelligibility goals. SLP offered to provide water protocol, however pt indicated via response to yes/no questions he would like to continue to work on speech intelligibility. SLP facilitated speech intelligibility skills with a barrier task, pt demonstrated at word level 50% intelligibility when a visual reference is not provided to the listener, however given max multimodal cues SLP able to understand verbal 1 word expression. SLP educated and provided communication book to aid in  effective communication when communication breakdown occurs. Pt demonstrated ability to write with non-dominant hand and agreed method would be helpful. Pt left in room in chair with call bell within reach. Recommend to continue ST services.   Function:  Eating Eating   Modified Consistency Diet: Yes Eating Assist Level: Supervision or verbal cues;Set up assist for   Eating Set Up Assist For: Opening containers       Cognition Comprehension Comprehension assist level: Understands basic 75 - 89% of the time/ requires cueing 10 - 24% of the time  Expression   Expression assist level: Expresses basic 50 - 74% of the time/requires cueing 25 - 49% of the time. Needs to repeat parts of sentences.  Social Interaction Social Interaction assist level: Interacts appropriately 75 - 89% of the time - Needs redirection for appropriate language or to initiate interaction.  Problem Solving Problem solving assist level: Solves basic 50 - 74% of the time/requires cueing 25 - 49% of the time  Memory Memory assist level: Recognizes or recalls 25 - 49% of the time/requires cueing 50 - 75% of the time    Pain Pain Assessment Pain Assessment: No/denies pain Pain Score: 0-No pain  Therapy/Group: Individual Therapy  Lenix Benoist  [facility] 02/25/2017, 3:41 PM

## 2017-02-25 NOTE — Progress Notes (Signed)
Social Work Patient ID: Brandon HolterHorace C Valliant, male   DOB: 07-08-1940, 76 y.o.   MRN: [id number]  [facility]. Have informed team and MD/PA aware of this plan. Son to be here tomorrow at 11:00. Work on discharge paperwork.

## 2017-02-25 NOTE — Progress Notes (Signed)
Occupational Therapy Discharge Summary  Patient Details  Name: Brandon Howard MRN: [id number] Date of Birth: January 12, 1941    Patient has met 45 of 11 long term goals due to improved activity tolerance, improved balance, postural control, ability to compensate for deficits, functional use of  RIGHT upper extremity, improved attention, improved awareness and improved coordination.  Patient to discharge at [facility] Assist level.  Patient's family is unable to provide needed physical and cognitive assist at d/c an therefore pt to d/c to SNF in order to cont to increase safety and independence with ADLs. Pt d/c at overall min A level, however, can require up to mod A for functional transfers when set-up is not conducive to pt's abilities.   Reasons goals not met: Pt has demonstrated ability to perform at overall min A level for functional tasks, however, requires proper set-up for min A level to be performed and can require up to heavy mod A if set-up is not correct for tasks such as dynamic standing balance and toileting tasks.   Recommendation:  Patient will benefit from ongoing skilled OT services in skilled nursing facility setting to continue to advance functional skills in the area of BADL and Reduce care partner burden.  Equipment: To be determined at next venue of care  Reasons for discharge: treatment goals met and discharge from hospital  Patient/family agrees with progress made and goals achieved: Yes  OT Discharge Precautions/Restrictions  Precautions Precautions: Fall Restrictions Weight Bearing Restrictions: No Other Position/Activity Restrictions: Dysarthria from prior stroke with prior R sided weakness Vision Baseline Vision/History: No visual deficits Vision Assessment?: No apparent visual deficits Perception  Perception: Within Functional Limits Praxis Praxis: Intact Cognition Overall Cognitive Status: Impaired/Different from baseline Arousal/Alertness:  Awake/alert Orientation Level: Other (comment) (Difficult to assess due to dysarthria, pt appears oriented to place and situation) Sustained Attention: Appears intact Selective Attention: Appears intact Memory: Appears intact Awareness: Impaired Awareness Impairment: Emergent impairment Problem Solving: Appears intact Safety/Judgment: Appears intact Sensation Sensation Light Touch: Appears Intact Hot/Cold: Appears Intact Proprioception: Impaired Detail Proprioception Impaired Details: Impaired RLE Coordination Gross Motor Movements are Fluid and Coordinated: No Fine Motor Movements are Fluid and Coordinated: No Coordination and Movement Description: R hemiplegia Motor  Motor Motor: Abnormal tone;Hemiplegia;Ataxia Motor - Discharge Observations: R hemiplegia Trunk/Postural Assessment  Cervical Assessment Cervical Assessment: Exceptions to [facility] (Forward head) Thoracic Assessment Thoracic Assessment: Exceptions to [facility] (Kyphotic) Lumbar Assessment Lumbar Assessment: Exceptions to [facility] (Posterior pelvic tilt) Postural Control Postural Control: Deficits on evaluation (R lean with dynamic standing tasks)  Balance Balance Balance Assessed: Yes Static Sitting Balance Static Sitting - Balance Support: Feet supported Static Sitting - Level of Assistance: 6: Modified independent (Device/Increase time);5: Stand by assistance Static Sitting - Comment/# of Minutes: Sitting EOB Dynamic Sitting Balance Dynamic Sitting - Balance Support: During functional activity;Feet supported Dynamic Sitting - Level of Assistance: 5: Stand by assistance;4: Min assist Static Standing Balance Static Standing - Balance Support: During functional activity;Right upper extremity supported Static Standing - Level of Assistance: 5: Stand by assistance Static Standing - Comment/# of Minutes: Standing at sink or hemi walker Dynamic Standing Balance Dynamic Standing - Balance Support: During functional  activity;Left upper extremity supported Dynamic Standing - Level of Assistance: 4: Min assist Dynamic Standing - Comments: During LB bathing/dressing and toileting tasks Extremity/Trunk Assessment RUE AROM (degrees) Overall AROM Right Upper Extremity: Deficits;Due to premorbid status;Due to pain RUE Overall AROM Comments: Pt with 90 degrees shoulder flexion PTA, reports pain with shoulder flexion currently. Light  gross grasp LUE Assessment LUE Assessment: Within Functional Limits   See Function Navigator for Current Functional Status.  [name], Garrit Marrow C 02/25/2017, 4:05 PM

## 2017-02-25 NOTE — Discharge Summary (Addendum)
Physician Discharge Summary  Patient ID: Brandon Howard MRN: [id number] DOB/AGE: [birth year] 76 y.o.  Admit date: 02/04/2017 Discharge date: 02/26/2017  Discharge Diagnoses:  Principal Problem:   Arterial ischemic stroke, vertebrobasilar, brainstem, acute, unspecified laterality (HCC) Active Problems:   Dysphagia, post-stroke   History of CVA with residual deficit   Hyperlipidemia   Polysubstance abuse   Coronary artery disease involving coronary bypass graft of native heart without angina pectoris   Right hemiparesis (HCC)   Gait disturbance, post-stroke   Discharged Condition: stable   Significant Diagnostic Studies: N/A   Labs:  Basic Metabolic Panel: BMP Latest Ref Rng & Units 02/25/2017 02/18/2017 02/11/2017  Glucose 65 - 99 mg/dL 96 97 92  BUN 6 - 20 mg/dL 11 12 15   Creatinine 0.61 - 1.24 mg/dL 4.09 8.11 9.14  Sodium 135 - 145 mmol/L 141 141 140  Potassium 3.5 - 5.1 mmol/L 3.8 3.9 3.9  Chloride 101 - 111 mmol/L 104 105 105  CO2 22 - 32 mmol/L 30 29 27   Calcium 8.9 - 10.3 mg/dL 7.8(G) 9.0 9.5(A)    CBC: CBC Latest Ref Rng & Units 02/26/2017 02/25/2017 02/18/2017  WBC 4.0 - 10.5 K/uL 6.2 6.2 7.5  Hemoglobin 13.0 - 17.0 g/dL 21.3 08.6 57.8  Hematocrit 39.0 - 52.0 % 43.0 41.2 45.7  Platelets 150 - 400 K/uL 268 235 315    CBG: No results for input(s): GLUCAP in the last 168 hours.  Brief HPI:   Brandon Howard a 76 y.o.malewith history of HTN, CVA with residual RUE weakness and moderate dysarthria (CIR stay 10/2008) who was admitted on 01/31/17 with reports of increased weakness RUE and speech difficulty. 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 MRI reviewed, showing bilateral infarcts. Per report and MRA brain, acute infarcts in left > right pons, bilateral cerebellum and right thalamus,  critical proximal BS stenosis with question thrombus or irregular plaque.  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  As follow up CT head showed no significant changes.  recommended monitoring for ETOH withdrawal as well as counseling to stop frequent cocaine use.   Therapy ongoing and patient with significant dysarthria and dysphagia as well as decline in mobility and ability to carry out ADL tasks. CIR recommended by MD and rehab team.    Hospital Course: Brandon Howard was admitted to rehab 02/04/2017 for inpatient therapies to consist of PT, ST and OT at least three hours five days a week. Past admission physiatrist, therapy team and rehab RN have worked together to provide customized collaborative inpatient rehab. Blood pressures have been monitored on bid basis have been controlled with monitoring for hypotension in setting of proximal BA stenosis. He continues on ASA/plavix and has been tolerating this without SE. CBC showed H/H is relatively stable.   He was maintained on dysphagia 3 diet with nectar liquids and renal status has been monitored wit serial checks. He has been able to maintain adequate hydration on nectar liquids. MBS was repeated on 8/23 and showed no functional improvement and ST recommended continuing strict aspiration precautions. He is able to sense aspiration of thins via tsp/cup and was started on water protocol with tsp.  Po intake has been good and he is continent of  bowel and bladder. Sennakot S was added to help manage constipation. He continues to have severe dysarthria with right hemiparesis and currently requires min to mod assist for mobility and to express needs. His family is unable to provide care needed and has elected on SNF for follow up therapy. He was discharged to [facility] on 8/29   Rehab course: During patient's  stay in rehab weekly team conferences were held to monitor patient's progress, set goals and discuss barriers to discharge. At admission, patient required max-total assist with basic self-care tasks and max assist with mobility. He exhibited global aphasia with severe dysarthria, lot of paraphasia and dysphagia requiring modified diet. He has had improvement in activity tolerance, balance, postural control, as well as ability to compensate for deficits. He is able to complete ADL tasks with min assist for set up and heavy mod assist for dynamic balance and toileting tasks if set up not correct. He is able to propel wheelchair with supervision for 200'.  He requires min assist for transfers.  He is able to ambulate 15' X 2 with min assist and mod to max assist for RLE placement.  He is tolerating dysphagia 3, nectar liquids and requires supervision to maintain safe swallow strategies. He is able to respond with 70% accuracy and max visual and verbal cues for basic Y/N questions. Speech intelligibility has improved to 50-70% at one word level with max multimodal cues.    Disposition: Skilled Nursing facility  Diet: Dysphagia 3, nectar liquids with full supervision for safety.  Special Instructions: 1. Monitor for pocketing. Small bites/sips. Medications crushed in puree.  2. Recommend consulting Dr Percell Boston for evaluation at [facility]   Discharge Instructions    Ambulatory referral to Physical Medicine Rehab    Complete by:  As directed    3-4  Weeks follow up appt     Allergies as of 02/26/2017   No Known Allergies     Medication List    TAKE these medications   acetaminophen 325 MG tablet Commonly known as:  TYLENOL Take 1-2 tablets (325-650 mg total) by mouth every 4 (four) hours as needed for mild pain.   allopurinol 100 MG tablet Commonly known as:  ZYLOPRIM Take 100 mg by mouth daily.   amLODipine 10 MG tablet Commonly known as:  NORVASC Take 10 mg by mouth daily.   aspirin  81 MG chewable tablet Chew 1 tablet (81 mg total) by mouth daily.   clopidogrel 75 MG tablet Commonly known as:  PLAVIX Take 75 mg by mouth daily.   lisinopril 10 MG tablet Commonly known as:  PRINIVIL,ZESTRIL Take 1 tablet (10 mg total) by mouth daily. What changed:  medication strength  how much to take   senna-docusate 8.6-50 MG tablet Commonly known as:  Senokot-S Take 2 tablets by mouth at bedtime.   simvastatin 40 MG tablet Commonly known as:  ZOCOR Take 40 mg by mouth every evening.   traZODone 50 MG tablet Commonly known as:  DESYREL Take 0.5-1 tablets (25-50 mg total) by mouth at bedtime as needed for sleep.            Discharge Care Instructions        Start     Ordered   02/26/17 0000  lisinopril (PRINIVIL,ZESTRIL) 10 MG tablet  Daily    Question:  Supervising Provider  Answer:  Faith Rogue T   02/25/17 1736   02/25/17 0000  acetaminophen (TYLENOL) 325 MG tablet  Every 4 hours PRN  Question:  Supervising Provider  Answer:  Faith RogueSWARTZ, ZACHARY T   02/25/17 1736   02/25/17 0000  senna-docusate (SENOKOT-S) 8.6-50 MG tablet  Daily at bedtime    Question:  Supervising Provider  Answer:  Faith RogueSWARTZ, ZACHARY T   02/25/17 1736   02/25/17 0000  traZODone (DESYREL) 50 MG tablet  At bedtime PRN    Question:  Supervising Provider  Answer:  Faith RogueSWARTZ, ZACHARY T   02/25/17 1736   02/25/17 0000  Ambulatory referral to Physical Medicine Rehab    Comments:  3-4  Weeks follow up appt   02/25/17 1736      Contact information for follow-up providers    Kirsteins, Victorino SparrowAndrew E, MD Follow up.   Specialty:  Physical Medicine and Rehabilitation Why:  office will call you with follow up appointment Contact information: [address] [place] [address] [phone]        Micki RileySethi, Pramod S, MD. Call in 1 day(s).   Specialties:  Neurology, Radiology Why:  for follow up appointment Contact information: [address] [place]  [id number] [phone]            Contact information for after-discharge care    Destination    [facility] .   Specialty:  Skilled Nursing Facility Contact information: [address] [place] [id number] [phone]                  Signed: Jacquelynn CreeLove, [place] Lamons S 02/26/2017, 9:07 AM

## 2017-02-25 NOTE — Progress Notes (Addendum)
Social Work Patient ID: Brandon Howard, male   DOB: 1941/02/20, 76 y.o.   MRN: [id number]  [facility], now working on getting [employer] Medicare authorization for  Transfer. [facility]. Pt and sister aware of this plan and [facility]. Updated pt on team conference progress toward his goals and plan for tomorrow. Son to be here tomorrow at 10:00 am

## 2017-02-25 NOTE — Plan of Care (Signed)
Problem: RH BOWEL ELIMINATION Goal: RH STG MANAGE BOWEL WITH ASSISTANCE STG Manage Bowel with Min Assistance.  Outcome: Not Progressing Required digital stimulation to empty bowels

## 2017-02-25 NOTE — Progress Notes (Signed)
Physical Therapy Session Note  Patient Details  Name: Brandon Howard MRN: [id number] Date of Birth: [birth year]  Today's Date: 02/25/2017 PT Individual Time: 0830-0935 PT Individual Time Calculation (min): 65 min   Short Term Goals: Week 3:  PT Short Term Goal 1 (Week 3): STG =LTG due to Estimated d/c date.   Skilled Therapeutic Interventions/Progress Updates: Pt presented in w/c agreeable to therapy. Participated in standing balance/tolerance and R NMR activities. Pt propelled to rehab gym performed stand pivot transfer into Kinetron modA . Use of Kinetron seated with knees supported at 50cm/sec for LE strengthening and RLE feedback. Pt able to perform x 2 bouts at 3 min each with rest. Performed in standing with visual feedback and manual facilitate of use of RLE. Pt able to perform x 1 min before fatigue. Performed backwards propulsion of w/c with manual facilitation of RLE for increased r quad activation. Performed standing balance at washing machine to pull washed clothes out and hand to PTA to place in dryer. Performed standing balance at table with use of peg board. Pt able to maintain static standing balance x 5 min before fatigue and required min/mod cues with use of picture to mirror task. Pt returned to room and left with call bell within reach and needs met.      Therapy Documentation Precautions:  Precautions Precautions: Fall Restrictions Weight Bearing Restrictions: No Other Position/Activity Restrictions: Dysarthria from prior stroke with prior R sided weakness General:   Vital Signs:    See Function Navigator for Current Functional Status.   Therapy/Group: Individual Therapy  Brandon Howard  Brandon Howard, PTA  02/25/2017, 10:12 AM

## 2017-02-26 ENCOUNTER — Inpatient Hospital Stay (HOSPITAL_COMMUNITY): Payer: Medicare HMO | Admitting: Physical Therapy

## 2017-02-26 ENCOUNTER — Inpatient Hospital Stay (HOSPITAL_COMMUNITY): Payer: Medicare HMO | Admitting: Occupational Therapy

## 2017-02-26 LAB — CBC
HCT: 43 % (ref 39.0–52.0)
Hemoglobin: 14.2 g/dL (ref 13.0–17.0)
MCH: 30.7 pg (ref 26.0–34.0)
MCHC: 33 g/dL (ref 30.0–36.0)
MCV: 93.1 fL (ref 78.0–100.0)
PLATELETS: 268 10*3/uL (ref 150–400)
RBC: 4.62 MIL/uL (ref 4.22–5.81)
RDW: 13.3 % (ref 11.5–15.5)
WBC: 6.2 10*3/uL (ref 4.0–10.5)

## 2017-02-26 NOTE — Progress Notes (Signed)
Patient discharged from rehab.  He was escorted by EMS staff via stretcher to be transported to nursing home.  Patient appears to be in no immediate distress.  All patient belongings sent with patient.  Dani Gobbleeardon, Alize Acy J, RN

## 2017-02-26 NOTE — Progress Notes (Signed)
Physical Therapy Discharge Summary  Patient Details  Name: Brandon Howard MRN: [id number] Date of Birth: July 02, 1940  Today's Date: 02/26/2017      Patient has met 9 of 9 long term goals due to improved activity tolerance, improved balance, improved postural control, increased strength, increased range of motion, decreased pain and functional use of  right upper extremity.  Patient to discharge at a wheelchair level Supervision.   Patient's care partner unavailable to provide the necessary physical and cognitive assistance at discharge.  Reasons goals not met: all tx goals met.   Recommendation:  Patient will benefit from ongoing skilled PT services in skilled nursing facility setting to continue to advance safe functional mobility, address ongoing impairments in balance, gait, transfers, safety, strength, and minimize fall risk.  Equipment:  RAFO  Reasons for discharge: treatment goals met and discharge from hospital  Patient/family agrees with progress made and goals achieved: Yes  PT Discharge Precautions/Restrictions   fall R hemiplegia  Vision/Perception  Perception Perception: Within Functional Limits Praxis Praxis: Intact  Cognition Overall Cognitive Status: Impaired/Different from baseline Arousal/Alertness: Awake/alert Orientation Level: Other (comment) (Difficult to assess due to dysarthria, pt appears oriented to place and situation) Sustained Attention: Appears intact Selective Attention: Appears intact Memory: Appears intact Awareness: Impaired Awareness Impairment: Emergent impairment Problem Solving: Appears intact Safety/Judgment: Appears intact Sensation Sensation Light Touch: Appears Intact Hot/Cold: Appears Intact Proprioception: Impaired Detail Proprioception Impaired Details: Impaired RLE Coordination Gross Motor Movements are Fluid and Coordinated: No Fine Motor Movements are Fluid and Coordinated: No Coordination and Movement Description: R  hemiplegia Motor  Motor Motor: Abnormal tone;Hemiplegia;Ataxia Motor - Discharge Observations: R hemiplegia  Mobility Bed Mobility Bed Mobility: Rolling Right;Rolling Left;Supine to Sit;Sit to Supine Rolling Right: 5: Supervision Rolling Left: 5: Supervision Supine to Sit: 5: Supervision Sit to Supine: 5: Supervision Transfers Transfers: Yes Sit to Stand: 4: Min assist Sit to Stand Details: Verbal cues for safe use of DME/AE;Verbal cues for precautions/safety Sit to Stand Details (indicate cue type and reason): with HW support  Squat Pivot Transfers: 4: Min Teacher, English as a foreign language Transfer Details: Verbal cues for safe use of DME/AE;Verbal cues for precautions/safety Locomotion  Ambulation Ambulation: Yes Ambulation/Gait Assistance: 3: Mod assist Ambulation Distance (Feet): 30 Feet Assistive device: Hemi-walker;Other (Comment) (AFO ) Gait Gait: Yes Gait Pattern: Step-to pattern;Right flexed knee in stance;Wide base of support (genuvarus on the RLE) Stairs / Additional Locomotion Stairs: No Architect: Yes Wheelchair Assistance: 5: Supervision Wheelchair Propulsion: Left upper extremity;Left lower extremity Wheelchair Parts Management: Supervision/cueing Distance: 130f   Trunk/Postural Assessment  Cervical Assessment Cervical Assessment: Exceptions to [facility](Forward head) Thoracic Assessment Thoracic Assessment: Exceptions to [facility](Kyphotic) Lumbar Assessment Lumbar Assessment: Exceptions to [facility](Posterior pelvic tilt) Postural Control Postural Control: Deficits on evaluation (R lean with dynamic standing tasks)  Balance Balance Balance Assessed: Yes Static Sitting Balance Static Sitting - Balance Support: Feet supported Static Sitting - Level of Assistance: 6: Modified independent (Device/Increase time);5: Stand by assistance Dynamic Sitting Balance Dynamic Sitting - Balance Support: During functional activity;Feet supported Dynamic Sitting -  Level of Assistance: 5: Stand by assistance;4: Min assist Static Standing Balance Static Standing - Balance Support: During functional activity;Right upper extremity supported Static Standing - Level of Assistance: 5: Stand by assistance Dynamic Standing Balance Dynamic Standing - Balance Support: During functional activity;Left upper extremity supported Dynamic Standing - Level of Assistance: 4: Min assist Extremity Assessment      RLE Assessment RLE Assessment: Exceptions to [facility] Strength RLE  Overall Strength Comments: grossly 4-/5 at hip and 3+5 to knee in flexion and extension limited AROM in the ankle LLE Assessment LLE Assessment: Within Functional Limits   See Function Navigator for Current Functional Status.  Brandon Howard 02/27/2017, 8:19 AM

## 2017-02-26 NOTE — Progress Notes (Signed)
Subjective/Complaints:  Pt aware of D/C to SNF  ROS: Limited due to cognitive/behavioral   Objective: Vital Signs: Blood pressure 113/75, pulse 74, temperature 98.9 F (37.2 C), temperature source Oral, resp. rate 18, height 5\' 7"  (1.702 m), weight 72 kg (158 lb 10.9 oz), SpO2 98 %. No results found. Results for orders placed or performed during the hospital encounter of 02/04/17 (from the past 72 hour(s))  Basic metabolic panel     Status: Abnormal   Collection Time: 02/25/17  4:16 AM  Result Value Ref Range   Sodium 141 135 - 145 mmol/L   Potassium 3.8 3.5 - 5.1 mmol/L   Chloride 104 101 - 111 mmol/L   CO2 30 22 - 32 mmol/L   Glucose, Bld 96 65 - 99 mg/dL   BUN 11 6 - 20 mg/dL   Creatinine, Ser 02/27/17 0.61 - 1.24 mg/dL   Calcium 8.6 (L) 8.9 - 10.3 mg/dL   GFR calc non Af Amer >60 >60 mL/min   GFR calc Af Amer >60 >60 mL/min    Comment: (NOTE) The eGFR has been calculated using the CKD EPI equation. This calculation has not been validated in all clinical situations. eGFR's persistently <60 mL/min signify possible Chronic Kidney Disease.    Anion gap 7 5 - 15  CBC     Status: None   Collection Time: 02/25/17  4:16 AM  Result Value Ref Range   WBC 6.2 4.0 - 10.5 K/uL   RBC 4.45 4.22 - 5.81 MIL/uL   Hemoglobin 13.6 13.0 - 17.0 g/dL   HCT 02/27/17 25.9 - 27.0 %   MCV 92.6 78.0 - 100.0 fL   MCH 30.6 26.0 - 34.0 pg   MCHC 33.0 30.0 - 36.0 g/dL   RDW 17.8 43.3 - 32.7 %   Platelets 235 150 - 400 K/uL  CBC     Status: None   Collection Time: 02/26/17  5:47 AM  Result Value Ref Range   WBC 6.2 4.0 - 10.5 K/uL   RBC 4.62 4.22 - 5.81 MIL/uL   Hemoglobin 14.2 13.0 - 17.0 g/dL   HCT 02/28/17 93.9 - 65.0 %   MCV 93.1 78.0 - 100.0 fL   MCH 30.7 26.0 - 34.0 pg   MCHC 33.0 30.0 - 36.0 g/dL   RDW 52.5 79.8 - 87.6 %   Platelets 268 150 - 400 K/uL     HEENT: Normocephalic, atraumatic Cardio: RRR without murmur. No JVD     Resp: CTA Bilaterally without wheezes or rales. Normal effort   GI: BS positive and ND Skin:   Intact. Warm and dry. Neuro:  Right facial droop , dysarthric Cranial Nerve Abnormalities Right central 7 Severe dysarthria Motor 3- to 3/5, right deltoid, biceps, triceps, grip with spastic tone. Still doesn't initiate use of LUE without cueing. 4+/5 right hip flexor, knee extensor, ankle dorsiflexor and LUE/LLE: 5/5 proximal to distal Musc/Skel:  No edema, no tenderness. Gen. no acute distress.  .   Assessment/Plan: 1. 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 Physiatrist is providing close team supervision and 24 hour management of active medical problems listed below. Physiatrist and rehab team continue to assess barriers to discharge/monitor patient progress toward functional and medical goals. FIM: Function - Bathing Position: Shower Body parts bathed by patient: Chest, Abdomen, Right upper leg, Left upper leg, Left lower leg, Right lower leg, Right arm, Left arm, Front perineal  area, Buttocks Body parts bathed by helper: Back Assist Level: Supervision or verbal cues  Function- Upper Body Dressing/Undressing What is the patient wearing?: Pull over shirt/dress Pull over shirt/dress - Perfomed by patient: Thread/unthread right sleeve, Thread/unthread left sleeve, Put head through opening, Pull shirt over trunk Pull over shirt/dress - Perfomed by helper: Thread/unthread left sleeve, Put head through opening Assist Level: More than reasonable time Function - Lower Body Dressing/Undressing What is the patient wearing?: Pants, Non-skid slipper socks Position: Wheelchair/chair at sink Pants- Performed by patient: Thread/unthread right pants leg, Thread/unthread left pants leg, Pull pants up/down Pants- Performed by helper: Fasten/unfasten pants Non-skid slipper socks- Performed by patient: Don/doff right sock, Don/doff left  sock Shoes - Performed by patient: Don/doff left shoe Shoes - Performed by helper: Don/doff right shoe, Fasten right, Fasten left AFO - Performed by helper: Don/doff right AFO TED Hose - Performed by helper: Don/doff right TED hose, Don/doff left TED hose Assist for footwear: Setup Assist for lower body dressing: Touching or steadying assistance (Pt > 75%)  Function - Toileting Toileting steps completed by patient: Adjust clothing prior to toileting, Performs perineal hygiene, Adjust clothing after toileting Toileting steps completed by helper: Adjust clothing prior to toileting, Performs perineal hygiene, Adjust clothing after toileting Toileting Assistive Devices: Grab bar or rail Assist level: Touching or steadying assistance (Pt.75%)  Function - Air cabin crew transfer assistive device: Grab bar Assist level to toilet: Touching or steadying assistance (Pt > 75%) Assist level from toilet: Touching or steadying assistance (Pt > 75%)  Function - Chair/bed transfer Chair/bed transfer method: Squat pivot Chair/bed transfer assist level: Touching or steadying assistance (Pt > 75%) Chair/bed transfer assistive device: Armrests Chair/bed transfer details: Manual facilitation for weight shifting, Manual facilitation for placement, Verbal cues for precautions/safety, Verbal cues for technique  Function - Locomotion: Wheelchair Will patient use wheelchair at discharge?: Yes Type: Manual Max wheelchair distance: 267f  Assist Level: Supervision or verbal cues Assist Level: Supervision or verbal cues Wheel 150 feet activity did not occur: Safety/medical concerns (Poor planning limits efficiency) Assist Level: Supervision or verbal cues Turns around,maneuvers to table,bed, and toilet,negotiates 3% grade,maneuvers on rugs and over doorsills: No Function - Locomotion: Ambulation Assistive device: Walker-rolling Max distance: 25 Assist level: Moderate assist (Pt 50 - 74%) Walk 10  feet activity did not occur: Safety/medical concerns (Dependence) Assist level: Moderate assist (Pt 50 - 74%) Walk 50 feet with 2 turns activity did not occur: Safety/medical concerns Walk 150 feet activity did not occur: Safety/medical concerns Walk 10 feet on uneven surfaces activity did not occur: Safety/medical concerns  Function - Comprehension Comprehension: Auditory Comprehension assist level: Understands basic 75 - 89% of the time/ requires cueing 10 - 24% of the time  Function - Expression Expression: Verbal Expression assist level: Expresses basic 50 - 74% of the time/requires cueing 25 - 49% of the time. Needs to repeat parts of sentences.  Function - Social Interaction Social Interaction assist level: Interacts appropriately 75 - 89% of the time - Needs redirection for appropriate language or to initiate interaction.  Function - Problem Solving Problem solving assist level: Solves basic 50 - 74% of the time/requires cueing 25 - 49% of the time  Function - Memory Memory assist level: Recognizes or recalls 25 - 49% of the time/requires cueing 50 - 75% of the time Patient normally able to recall (first 3 days only): None of the above  Medical Problem List and Plan: 1.  Dysarthria and dysphagia as well as decline  in mobility and ability to carry out ADL tasks. secondary to bilateral CVA.  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  DVT Prophylaxis/Anticoagulation: Pharmaceutical: Lovenox, monitor platelets, platelets were normal on 02/18/2017 3. Pain Management: N/A 4. Mood: LCSW to follow for evaluation and support.  5. Neuropsych: This patient is not fully capable of making decisions on his own behalf. 6. Skin/Wound Care: routine pressure relief measures.  7. Fluids/Electrolytes/Nutrition: Monitor I/Os. Monitor for signs of dehydration.  No change in diet following modified barium swallow  D3  Nectar, 75-90% of meals with good fluid intake  Bmet normal on 08/22 8. HTN: Meds resumed. Monitor for hypotension  in setting of proximal BA stenosis.   Controlled 8/30 Vitals:   02/26/17 0609 02/26/17 0814  BP: 116/67 113/75  Pulse: 74   Resp: 18   Temp: 98.9 F (37.2 C)   SpO2: 98%    9. Hyperlipidemia: On Zocor.  10. Polysubstance abuse: Counsel on importance of cessation. 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 On ASA, Plavix and Zocor.   LOS (Days) 22 A FACE TO FACE EVALUATION WAS PERFORMED  Fleeta Kunde E 02/26/2017, 8:35 AM

## 2017-02-26 NOTE — Progress Notes (Signed)
Called report to Corrie DandyMary, [facility]ity [facility]N [facility]  All questions answered to the oncoming nurses satisfaction.  Patient awaiting transport, scheduled for 1100.  Dani Gobbleeardon, Veida Spira J, RN

## 2017-02-26 NOTE — Progress Notes (Signed)
Social Work  Discharge Note  The overall goal for the admission was met for:   Discharge location: No-[facility]  Length of Stay: Yes-22 DAYS  Discharge activity level: Yes-MIN-MOD LEVEL  Home/community participation: Yes  Services provided included: MD, RD, PT, OT, SLP, RN, CM, TR, Pharmacy, Neuropsych and SW  Financial Services: Medicaid and Private Insurance: [employer]  Follow-up services arranged: Other: NHP  Comments (or additional information):NO FAMILY ABLE TO PROVIDE 24 HR CARE HOPE WITH MORE REHAB CAN RETURN TO SENIOR APARTMENT  Patient/Family verbalized understanding of follow-up arrangements: Yes  Individual responsible for coordination of the follow-up plan: JAMIE-SON & MARGARET-SISTER  Confirmed correct DME delivered: Elease Hashimoto 02/26/2017    Elease Hashimoto

## 2017-02-26 NOTE — Plan of Care (Signed)
Problem: RH BOWEL ELIMINATION Goal: RH STG MANAGE BOWEL WITH ASSISTANCE STG Manage Bowel with Min Assistance.   02/22/17 2307  Bowel Management Goals  STG: Pt will manage bowels with assistance 2-Maximum assistance    Problem: RH BLADDER ELIMINATION Goal: RH STG MANAGE BLADDER WITH ASSISTANCE STG Manage Bladder With Min Assistance   02/26/17 0053  Bladder Management Goals  STG: Pt will manage bladder with assistance 2-Maximum assistance

## 2017-03-04 NOTE — Progress Notes (Signed)
This is a late entry by PT for 02/25/17 data.

## 2017-03-17 ENCOUNTER — Inpatient Hospital Stay: Payer: Medicare HMO | Admitting: Physical Medicine & Rehabilitation

## 2017-03-17 ENCOUNTER — Encounter: Payer: Medicare HMO | Attending: Physical Medicine & Rehabilitation

## 2017-04-07 ENCOUNTER — Ambulatory Visit: Payer: Self-pay | Admitting: Neurology

## 2017-04-08 ENCOUNTER — Encounter: Payer: Self-pay | Admitting: Neurology

## 2017-04-09 ENCOUNTER — Telehealth: Payer: Self-pay

## 2017-04-09 NOTE — Telephone Encounter (Signed)
Patient was no show for appt on 04/07/2017.

## 2017-05-06 ENCOUNTER — Encounter: Payer: Self-pay | Admitting: Neurology

## 2017-05-06 ENCOUNTER — Encounter (INDEPENDENT_AMBULATORY_CARE_PROVIDER_SITE_OTHER): Payer: Self-pay

## 2017-05-06 ENCOUNTER — Ambulatory Visit (INDEPENDENT_AMBULATORY_CARE_PROVIDER_SITE_OTHER): Payer: Medicare HMO | Admitting: Neurology

## 2017-05-06 VITALS — BP 148/75 | HR 85 | Ht 68.0 in

## 2017-05-06 DIAGNOSIS — G8110 Spastic hemiplegia affecting unspecified side: Secondary | ICD-10-CM | POA: Diagnosis not present

## 2017-05-06 DIAGNOSIS — I651 Occlusion and stenosis of basilar artery: Secondary | ICD-10-CM

## 2017-05-06 NOTE — Progress Notes (Signed)
[facility] [address] [place]. [place] [address] [phone]       OFFICE FOLLOW-UP NOTE  Mr. Ishmael HolterHorace C Segoviano Date of Birth:  1941-04-16 Medical Record Number:  [id number]   HPI: Mr [name] is a [age] year caucasian male seen today for the first office follow-up visit following hospital admission for stroke in August 2018.  He is accompanied by his son.  History is obtained from them as well as review of electronic medical records.  I have personally reviewed imaging films.Vara GuardianHorace C Underwoodis an 76 y.o.malewith a past medical history significant for CVA with residual right arm weakness and hypertension who [facility] with complaints of worsening right sided weakness and trouble speaking. His last known normal was Wednesday 01/21/17. Mr. [name] noticed a significant worsening of his speech and right sided weakness over the last two days, although his son reports that Mr. Fusaro's speech has worsened significantly since approximately 1000.Mr. [name] [facility]. Based on the initial timing of last known well, a code stroke was not activated. At baseline, Mr. [name] lives independently in an independent living facility. Following is previous stroke he had trouble walking and right arm weakness. His son states that he walks fairly well now, only occasionally using a cane or walker.  CT scan of the head on admission showed ill-defined hypo-density in the left hemi-pons concerning for subacute infarct.  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  Proximal right posterior cerebral artery stenosis.  MRI scan of the brain confirmed a critical proximal basilar artery stenosis with flow gap and acute infarcts were noted in  the left more than right pons, bilateral cerebellum and right thalamus.  Remote age infarcts were noted in the upper right cerebellum and left corpus callosum.  Transthoracic echo showed normal ejection fraction.  LDL cholesterol was 56 mg percent.  Hemoglobin A1c was 5.3.  Patient was initially started on IV heparin and subsequently when he was able to swallow transition to aspirin and Plavix.  He was transferred to inpatient rehab where he did not make significant improvement and has currently been transferred to skilled nursing facility.  The patient's son states that there is really not made much progress.  He still has significant right hemiplegia.  He is not able to walk even with assistance or stand in his feet.  He is able to communicate little better dose he still has significant dysarthria but the son can understand him well.  His swallowing has improved.  He is on aspirin and Plavix and having minor bruising but no bleeding episodes.  He has significant pain and spasticity and has been started on baclofen 10 mg 3 times daily which is tolerating well but still complains of pain.     ROS:   14 system review of systems is positive for aching muscles, weakness, slurred speech, dysphagia, inability to walk and all other systems negative  PMH:  Past Medical History:  Diagnosis Date  . Gout   . Hepatitis   . Hypertension   . Pancreatitis   . Right knee pain   . Stroke (HCC)   . Vitamin B 12 deficiency     Social History:  Social History   Socioeconomic History  . Marital status: Divorced    Spouse name: Not on file  .  Number of children: Not on file  . Years of education: Not on file  . Highest education level: Not on file  Social Needs  . Financial resource strain: Not on file  . Food insecurity - worry: Not on file  . Food insecurity - inability: Not on file  . Transportation needs - medical: Not on file  . Transportation needs - non-medical: Not on file  Occupational  History  . Not on file  Tobacco Use  . Smoking status: Never Smoker  . Smokeless tobacco: Former Engineer, water and Sexual Activity  . Alcohol use: No    Frequency: Never  . Drug use: Yes    Types: "Crack" cocaine    Comment: in the past  . Sexual activity: Not on file  Other Topics Concern  . Not on file  Social History Narrative  . Not on file    Medications:   Current Outpatient Medications on File Prior to Visit  Medication Sig Dispense Refill  . acetaminophen (TYLENOL) 325 MG tablet Take 1-2 tablets (325-650 mg total) by mouth every 4 (four) hours as needed for mild pain.    Marland Kitchen allopurinol (ZYLOPRIM) 100 MG tablet Take 100 mg by mouth daily.  0  . amLODipine (NORVASC) 10 MG tablet Take 10 mg by mouth daily.  0  . baclofen (LIORESAL) 10 MG tablet Take 20 mg 3 (three) times daily by mouth.    . clopidogrel (PLAVIX) 75 MG tablet Take 75 mg by mouth daily.  0  . diclofenac sodium (VOLTAREN) 1 % GEL     . gabapentin (NEURONTIN) 100 MG capsule     . lisinopril (PRINIVIL,ZESTRIL) 10 MG tablet Take 1 tablet (10 mg total) by mouth daily.    Marland Kitchen senna-docusate (SENOKOT-S) 8.6-50 MG tablet Take 2 tablets by mouth at bedtime.    . simvastatin (ZOCOR) 40 MG tablet Take 40 mg by mouth every evening.  0  . traZODone (DESYREL) 50 MG tablet Take 0.5-1 tablets (25-50 mg total) by mouth at bedtime as needed for sleep.     No current facility-administered medications on file prior to visit.     Allergies:  No Known Allergies  Physical Exam General: Frail elderly Caucasian male, seated, in no evident distress Head: head normocephalic and atraumatic.  Neck: supple with no carotid or supraclavicular bruits Cardiovascular: regular rate and rhythm, no murmurs Musculoskeletal: no deformity Skin:  no rash/petichiae Vascular:  Normal pulses all extremities Vitals:   05/06/17 1437  BP: (!) 148/75  Pulse: 85   Neurologic Exam Mental Status: Awake and fully alert. Oriented to place and time.  Recent and remote memory intact. Attention span, concentration and fund of knowledge appropriate. Mood and affect appropriate.  Patient is dysarthric and can be understood with great difficulty.  Follows commands well. Cranial Nerves: Fundoscopic exam reveals sharp disc margins. Pupils equal, briskly reactive to light. Extraocular movements full without nystagmus but did show prominent saccadic dysmetria. visual fields full to confrontation. Hearing intact.  Right lower facial weakness.  Facial sensation intact. Face, tongue, palate moves normally and symmetrically.  Motor: Spastic right hemiplegia with right upper extremity 1/5 strength in right lower extremity 2/5 strength.  Significant weakness of right grip, intrinsic hand muscles and right ankle dorsiflexors.  Tone is increased on the right compared to the left side.  Normal strength and tone on the left side.. Sensory.: intact to touch ,pinprick .position and vibratory sensation.  Coordination: Rapid alternating movements normal in all extremities. Finger-to-nose and heel-to-shin  performed accurately bilaterally. Gait and Station: Arises from chair without difficulty. Stance is normal. Gait demonstrates normal stride length and balance . Able to heel, toe and tandem walk without difficulty.  Reflexes: 1+ and symmetric. Toes downgoing.   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  Significant residual spastic right hemiplegia.  Multiple vascular risk factors of hypertension, hyperlipidemia and polysubstance abuse    PLAN: I had a long d/w patient and his son about his recent stroke,spastic hemiparesis, basilar artery stenosis, risk for recurrent stroke/TIAs, personally independently reviewed imaging studies and stroke evaluation results and answered  questions.Continue Plavix 75 mg daily but discontinue aspirin 81 mg as it has been more than 3 months since his stroke for secondary stroke prevention .  But the patient is clearly not a candidate for aggressive intervention with basilar angioplasty and stenting given his poor functional status and limited quality of life.  Recommend maintain strict control of hypertension with blood pressure goal below 130/90, diabetes with hemoglobin A1c goal below 6.5% and lipids with LDL cholesterol goal below 70 mg/dL. I also advised the patient to eat a healthy diet with plenty of whole grains, cereals, fruits and vegetables, exercise regularly and maintain ideal body weight .Increase baclofen dose to 20 mg 3 times daily to help with this post stroke spasticity and pain.  Continue physical occupational and speech therapy and encourage the patient to be out of bed.  No scheduled follow-up appointment with me is necessary but he may be referred back in the future only as needed. Greater than 50% of time during this 35 minute visit was spent on counseling,explanation of diagnosis about brainstem stroke, basilar stenosis,, planning of further management, discussion with patient and family and coordination of care Delia HeadyPramod Sethi, MD  [facility] [address] [place], [place] [address]  Phone [phone] Fax [phone] Note: This document was prepared with digital dictation and possible smart phrase technology. Any transcriptional errors that result from this process are unintentional

## 2017-05-06 NOTE — Patient Instructions (Signed)
I had a long d/w patient and his son about his recent stroke,spastic hemiparesis, basilar artery stenosis, risk for recurrent stroke/TIAs, personally independently reviewed imaging studies and stroke evaluation results and answered questions.Continue Effexor 75 mg daily but discontinue aspirin 81 mg as it has been more than 3 months since his stroke for secondary stroke prevention and maintain strict control of hypertension with blood pressure goal below 130/90, diabetes with hemoglobin A1c goal below 6.5% and lipids with LDL cholesterol goal below 70 mg/dL. I also advised the patient to eat a healthy diet with plenty of whole grains, cereals, fruits and vegetables, exercise regularly and maintain ideal body weight .increased baclofen dose to 20 mg 3 times daily to help with this post stroke spasticity and pain.  Continue physical occupational and speech therapy and encourage the patient to be out of bed.  No scheduled follow-up appointment with me is necessary but he may be referred back in the future only as needed. Stroke Prevention Some medical conditions and behaviors are associated with an increased chance of having a stroke. You may prevent a stroke by making healthy choices and managing medical conditions. How can I reduce my risk of having a stroke?  Stay physically active. Get at least 30 minutes of activity on most or all days.  Do not smoke. It may also be helpful to avoid exposure to secondhand smoke.  Limit alcohol use. Moderate alcohol use is considered to be: ? No more than 2 drinks per day for men. ? No more than 1 drink per day for nonpregnant women.  Eat healthy foods. This involves: ? Eating 5 or more servings of fruits and vegetables a day. ? Making dietary changes that address high blood pressure (hypertension), high cholesterol, diabetes, or obesity.  Manage your cholesterol levels. ? Making food choices that are high in fiber and low in saturated fat, trans fat, and  cholesterol may control cholesterol levels. ? Take any prescribed medicines to control cholesterol as directed by your health care provider.  Manage your diabetes. ? Controlling your carbohydrate and sugar intake is recommended to manage diabetes. ? Take any prescribed medicines to control diabetes as directed by your health care provider.  Control your hypertension. ? Making food choices that are low in salt (sodium), saturated fat, trans fat, and cholesterol is recommended to manage hypertension. ? Ask your health care provider if you need treatment to lower your blood pressure. Take any prescribed medicines to control hypertension as directed by your health care provider. ? If you are 3718-77 years of age, have your blood pressure checked every 3-5 years. If you are 77 years of age or older, have your blood pressure checked every year.  Maintain a healthy weight. ? Reducing calorie intake and making food choices that are low in sodium, saturated fat, trans fat, and cholesterol are recommended to manage weight.  Stop drug abuse.  Avoid taking birth control pills. ? Talk to your health care provider about the risks of taking birth control pills if you are over 77 years old, smoke, get migraines, or have ever had a blood clot.  Get evaluated for sleep disorders (sleep apnea). ? Talk to your health care provider about getting a sleep evaluation if you snore a lot or have excessive sleepiness.  Take medicines only as directed by your health care provider. ? For some people, aspirin or blood thinners (anticoagulants) are helpful in reducing the risk of forming abnormal blood clots that can lead to stroke. If you  have the irregular heart rhythm of atrial fibrillation, you should be on a blood thinner unless there is a good reason you cannot take them. ? Understand all your medicine instructions.  Make sure that other conditions (such as anemia or atherosclerosis) are addressed. Get help right  away if:  You have sudden weakness or numbness of the face, arm, or leg, especially on one side of the body.  Your face or eyelid droops to one side.  You have sudden confusion.  You have trouble speaking (aphasia) or understanding.  You have sudden trouble seeing in one or both eyes.  You have sudden trouble walking.  You have dizziness.  You have a loss of balance or coordination.  You have a sudden, severe headache with no known cause.  You have new chest pain or an irregular heartbeat. Any of these symptoms may represent a serious problem that is an emergency. Do not wait to see if the symptoms will go away. Get medical help at once. Call your local emergency services (911 in [place] Do not drive yourself to the hospital. This information is not intended to replace advice given to you by your health care provider. Make sure you discuss any questions you have with your health care provider. Document Released: 07/24/2004 Document Revised: 11/22/2015 Document Reviewed: 12/17/2012 [employer] Interactive Patient Education  2017 [employer]

## 2017-07-31 DEATH — deceased

## 2018-03-29 IMAGING — RF DG SWALLOWING FUNCTION - NRPT [facility]
1 series · 18 of 24 positions shown · non-contrast
Comparison: none

[Series 1: run · 18 acquisitions, 18 frames shown]
[im 1/18]
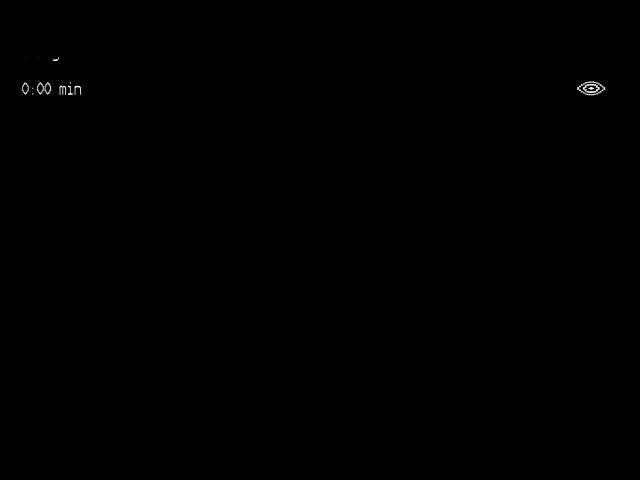
[im 2/18]
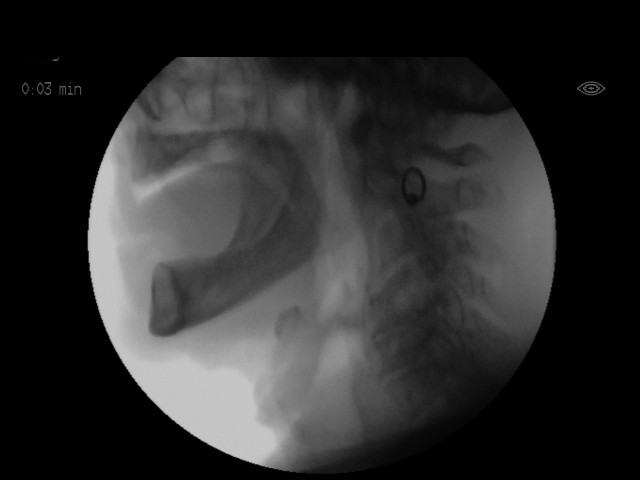
[im 3/18]
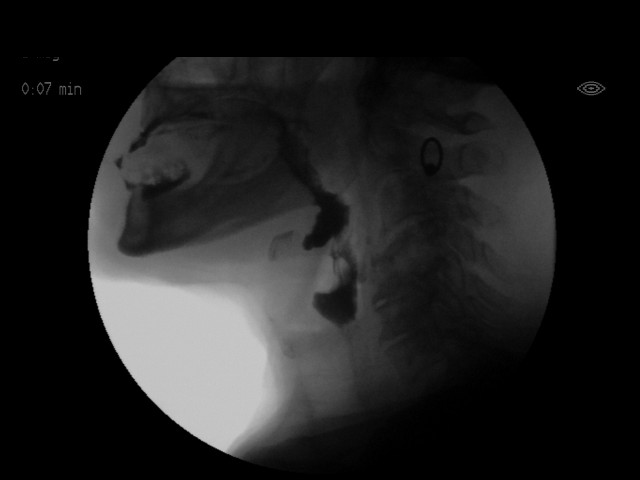
[im 4/18]
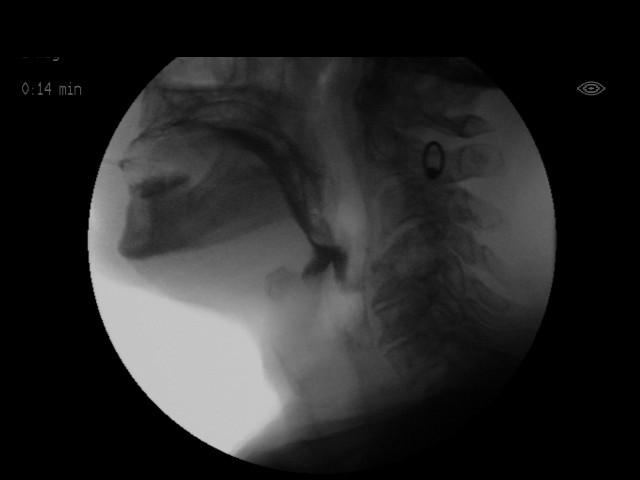
[im 5/18]
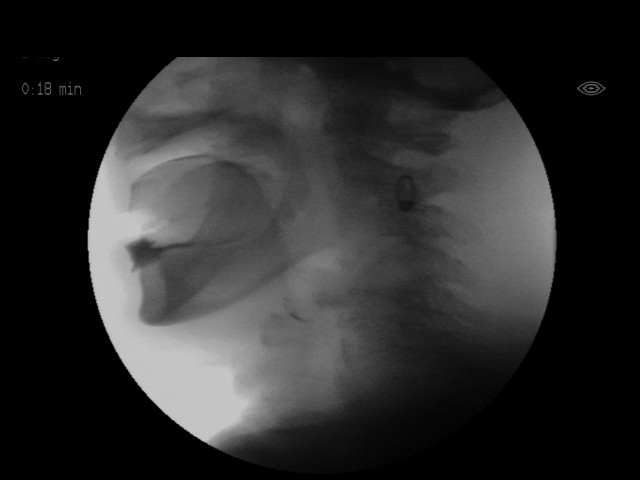
[im 6/18]
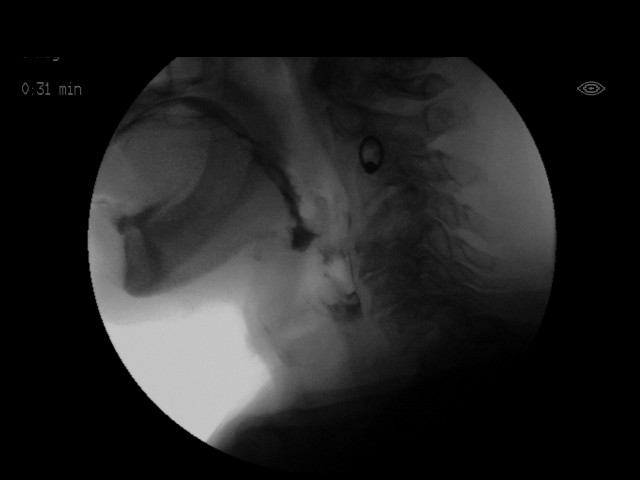
[im 7/18]
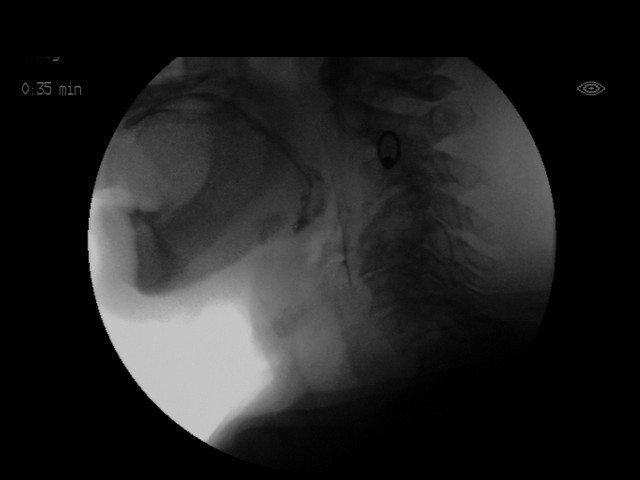
[im 8/18]
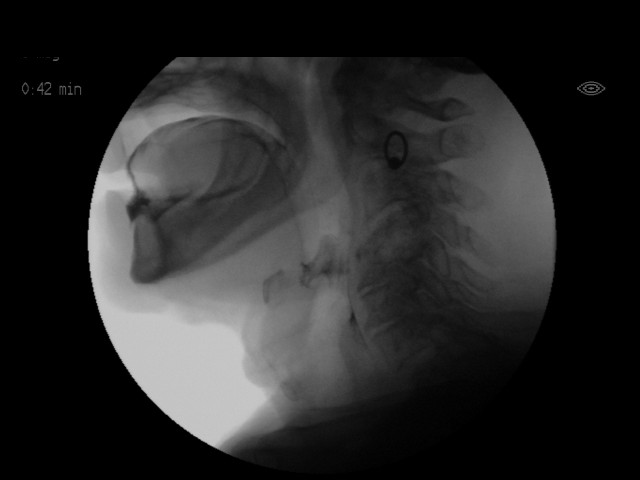
[im 9/18]
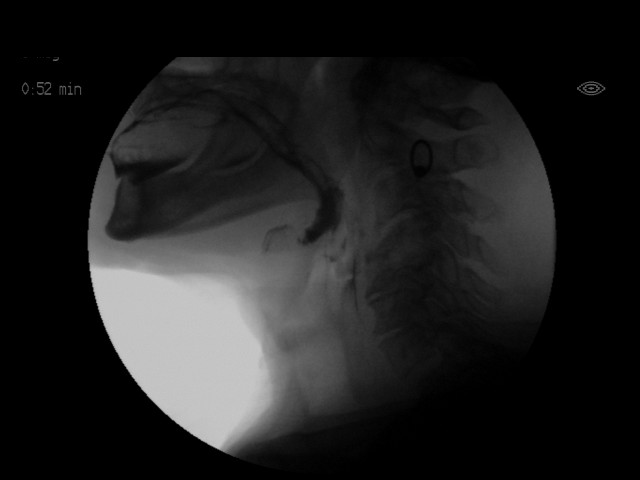
[im 10/18]
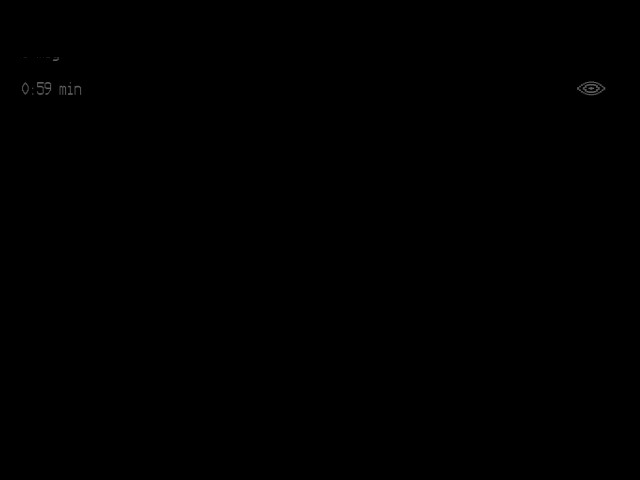
[im 12/18]
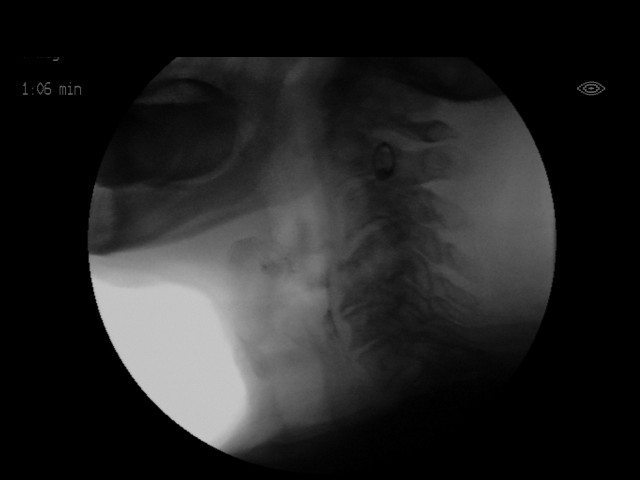
[im 12/18]
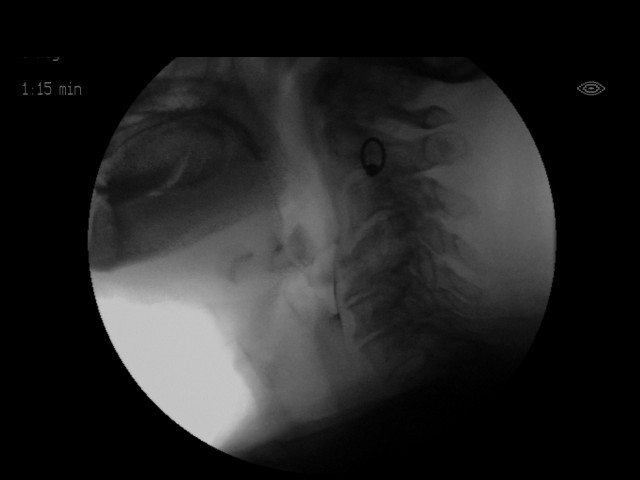
[im 13/18]
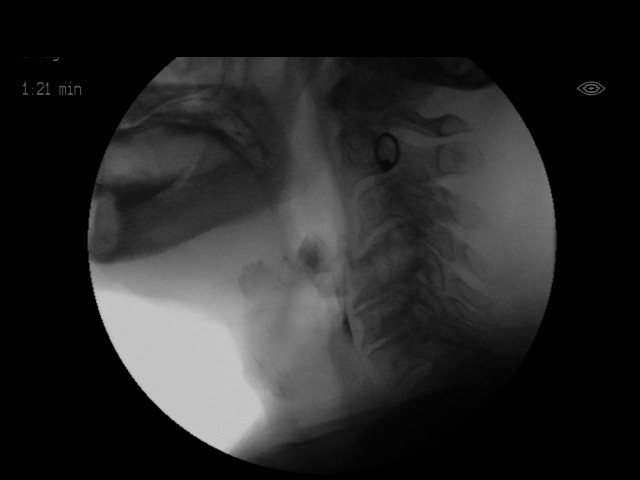
[im 15/18]
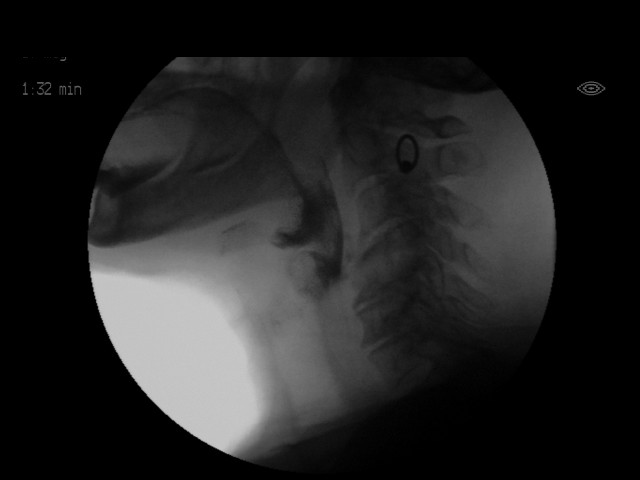
[im 15/18]
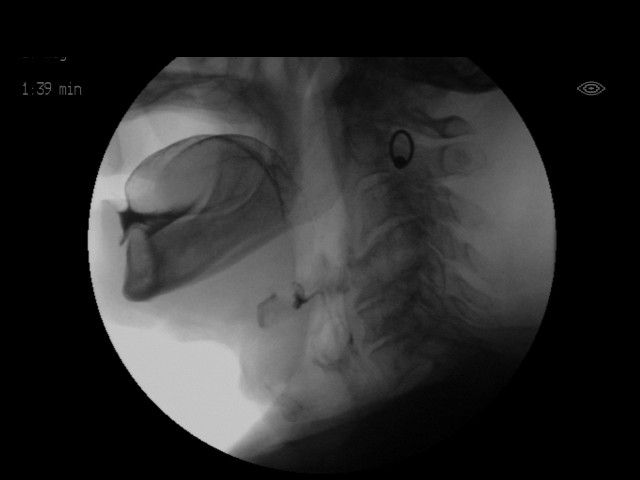
[im 16/18]
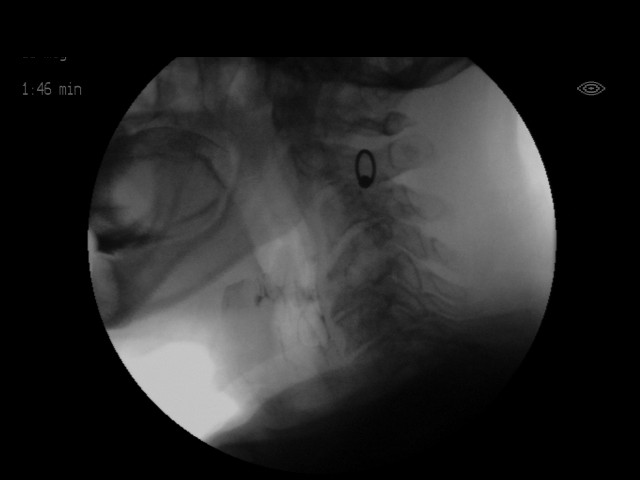
[im 18/18]
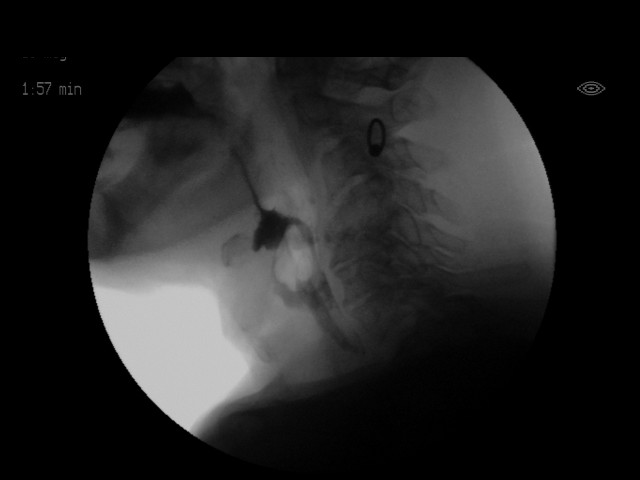
[im 18/18]
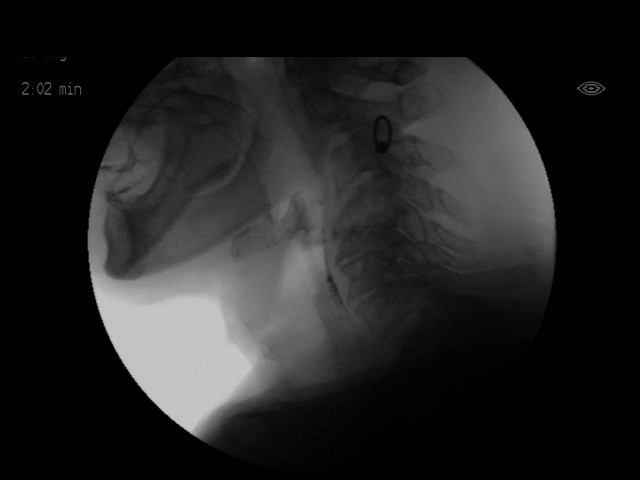

[18 of 24 positions shown; findings below may reference images not displayed]

FLUOROSCOPY FOR SWALLOWING FUNCTION STUDY:
Fluoroscopy was provided for swallowing function study, which was administered by a speech pathologist.  Final results and recommendations from this study are contained within the speech pathology report.
